# Patient Record
Sex: Male | Born: 1935
Health system: Southern US, Community
[De-identification: ages and names within clinical notes are randomized; demographics above are authoritative.]

## PROBLEM LIST (undated history)

## (undated) DIAGNOSIS — D126 Benign neoplasm of colon, unspecified: Secondary | ICD-10-CM

## (undated) DIAGNOSIS — I63422 Cerebral infarction due to embolism of left anterior cerebral artery: Secondary | ICD-10-CM

## (undated) DIAGNOSIS — K4090 Unilateral inguinal hernia, without obstruction or gangrene, not specified as recurrent: Secondary | ICD-10-CM

## (undated) DIAGNOSIS — M1990 Unspecified osteoarthritis, unspecified site: Secondary | ICD-10-CM

## (undated) DIAGNOSIS — M6740 Ganglion, unspecified site: Secondary | ICD-10-CM

## (undated) DIAGNOSIS — R8290 Unspecified abnormal findings in urine: Secondary | ICD-10-CM

## (undated) DIAGNOSIS — N501 Vascular disorders of male genital organs: Secondary | ICD-10-CM

## (undated) DIAGNOSIS — E785 Hyperlipidemia, unspecified: Secondary | ICD-10-CM

## (undated) DIAGNOSIS — C4490 Unspecified malignant neoplasm of skin, unspecified: Secondary | ICD-10-CM

## (undated) DIAGNOSIS — M2040 Other hammer toe(s) (acquired), unspecified foot: Secondary | ICD-10-CM

## (undated) DIAGNOSIS — M519 Unspecified thoracic, thoracolumbar and lumbosacral intervertebral disc disorder: Secondary | ICD-10-CM

## (undated) DIAGNOSIS — R3129 Other microscopic hematuria: Secondary | ICD-10-CM

## (undated) DIAGNOSIS — F329 Major depressive disorder, single episode, unspecified: Secondary | ICD-10-CM

## (undated) DIAGNOSIS — N393 Stress incontinence (female) (male): Secondary | ICD-10-CM

## (undated) DIAGNOSIS — Z87448 Personal history of other diseases of urinary system: Secondary | ICD-10-CM

## (undated) DIAGNOSIS — C61 Malignant neoplasm of prostate: Secondary | ICD-10-CM

## (undated) DIAGNOSIS — I10 Essential (primary) hypertension: Secondary | ICD-10-CM

## (undated) DIAGNOSIS — N183 Chronic kidney disease, stage 3 unspecified: Secondary | ICD-10-CM

## (undated) DIAGNOSIS — N200 Calculus of kidney: Secondary | ICD-10-CM

## (undated) DIAGNOSIS — C439 Malignant melanoma of skin, unspecified: Secondary | ICD-10-CM

## (undated) DIAGNOSIS — I639 Cerebral infarction, unspecified: Secondary | ICD-10-CM

## (undated) DIAGNOSIS — F0390 Unspecified dementia without behavioral disturbance: Secondary | ICD-10-CM

## (undated) DIAGNOSIS — K219 Gastro-esophageal reflux disease without esophagitis: Secondary | ICD-10-CM

## (undated) DIAGNOSIS — F32A Depression, unspecified: Secondary | ICD-10-CM

## (undated) HISTORY — DX: Cerebral infarction due to embolism of left anterior cerebral artery: I63.422

## (undated) HISTORY — PX: WRIST GANGLION EXCISION: SUR520

## (undated) HISTORY — DX: Calculus of kidney: N20.0

## (undated) HISTORY — DX: Unspecified thoracic, thoracolumbar and lumbosacral intervertebral disc disorder: M51.9

## (undated) HISTORY — DX: Ganglion, unspecified site: M67.40

## (undated) HISTORY — DX: Personal history of other diseases of urinary system: Z87.448

## (undated) HISTORY — DX: Stress incontinence (female) (male): N39.3

## (undated) HISTORY — PX: KIDNEY STONE SURGERY: SHX686

## (undated) HISTORY — DX: Benign neoplasm of colon, unspecified: D12.6

## (undated) HISTORY — DX: Vascular disorders of male genital organs: N50.1

## (undated) HISTORY — PX: COLONOSCOPY: SHX174

## (undated) HISTORY — DX: Chronic kidney disease, stage 3 (moderate): N18.3

## (undated) HISTORY — DX: Unilateral inguinal hernia, without obstruction or gangrene, not specified as recurrent: K40.90

## (undated) HISTORY — DX: Other microscopic hematuria: R31.29

## (undated) HISTORY — DX: Chronic kidney disease, stage 3 unspecified: N18.30

## (undated) HISTORY — DX: Unspecified abnormal findings in urine: R82.90

## (undated) HISTORY — DX: Other hammer toe(s) (acquired), unspecified foot: M20.40

## (undated) HISTORY — DX: Hyperlipidemia, unspecified: E78.5

## (undated) HISTORY — DX: Malignant neoplasm of prostate: C61

## (undated) HISTORY — DX: Malignant melanoma of skin, unspecified: C43.9

---

## 1997-07-19 HISTORY — PX: HERNIA REPAIR: SHX51

## 2005-09-03 ENCOUNTER — Ambulatory Visit: Payer: Self-pay | Admitting: Urology

## 2005-09-06 ENCOUNTER — Ambulatory Visit: Payer: Self-pay | Admitting: Urology

## 2005-09-13 ENCOUNTER — Ambulatory Visit: Payer: Self-pay | Admitting: Urology

## 2005-09-16 HISTORY — PX: RETROPUBIC PROSTATECTOMY: SUR1055

## 2005-09-28 ENCOUNTER — Other Ambulatory Visit: Payer: Self-pay

## 2005-10-07 ENCOUNTER — Inpatient Hospital Stay: Payer: Self-pay | Admitting: Urology

## 2007-12-18 DIAGNOSIS — C439 Malignant melanoma of skin, unspecified: Secondary | ICD-10-CM

## 2007-12-18 HISTORY — PX: MELANOMA EXCISION: SHX5266

## 2007-12-18 HISTORY — DX: Malignant melanoma of skin, unspecified: C43.9

## 2007-12-29 ENCOUNTER — Ambulatory Visit: Payer: Self-pay | Admitting: Dermatology

## 2008-05-19 HISTORY — PX: HERNIA REPAIR: SHX51

## 2008-06-03 ENCOUNTER — Ambulatory Visit: Payer: Self-pay | Admitting: Surgery

## 2008-06-10 ENCOUNTER — Ambulatory Visit: Payer: Self-pay | Admitting: Surgery

## 2008-09-02 DIAGNOSIS — D039 Melanoma in situ, unspecified: Secondary | ICD-10-CM

## 2008-09-02 HISTORY — DX: Melanoma in situ, unspecified: D03.9

## 2009-02-10 ENCOUNTER — Ambulatory Visit: Payer: Self-pay | Admitting: Dermatology

## 2009-04-02 ENCOUNTER — Ambulatory Visit: Payer: Self-pay | Admitting: Unknown Physician Specialty

## 2012-01-19 ENCOUNTER — Ambulatory Visit: Payer: Self-pay | Admitting: Urology

## 2012-01-19 LAB — CREATININE, SERUM: Creatinine: 1.07 mg/dL (ref 0.60–1.30)

## 2012-05-03 ENCOUNTER — Ambulatory Visit: Payer: Self-pay | Admitting: Urology

## 2012-05-19 HISTORY — PX: EXTRACORPOREAL SHOCK WAVE LITHOTRIPSY: SHX1557

## 2012-05-25 ENCOUNTER — Ambulatory Visit: Payer: Self-pay | Admitting: Urology

## 2012-05-29 ENCOUNTER — Ambulatory Visit: Payer: Self-pay | Admitting: Urology

## 2012-06-09 ENCOUNTER — Ambulatory Visit: Payer: Self-pay | Admitting: Urology

## 2012-08-09 ENCOUNTER — Ambulatory Visit: Payer: Self-pay | Admitting: Urology

## 2013-06-28 ENCOUNTER — Encounter: Payer: Self-pay | Admitting: Unknown Physician Specialty

## 2013-07-19 ENCOUNTER — Encounter: Payer: Self-pay | Admitting: Unknown Physician Specialty

## 2013-08-15 ENCOUNTER — Ambulatory Visit: Payer: Self-pay | Admitting: Urology

## 2013-12-21 DIAGNOSIS — C61 Malignant neoplasm of prostate: Secondary | ICD-10-CM

## 2013-12-21 DIAGNOSIS — N200 Calculus of kidney: Secondary | ICD-10-CM

## 2013-12-21 DIAGNOSIS — E785 Hyperlipidemia, unspecified: Secondary | ICD-10-CM

## 2013-12-21 DIAGNOSIS — M519 Unspecified thoracic, thoracolumbar and lumbosacral intervertebral disc disorder: Secondary | ICD-10-CM

## 2013-12-21 HISTORY — DX: Hyperlipidemia, unspecified: E78.5

## 2013-12-21 HISTORY — DX: Unspecified thoracic, thoracolumbar and lumbosacral intervertebral disc disorder: M51.9

## 2013-12-21 HISTORY — DX: Malignant neoplasm of prostate: C61

## 2013-12-21 HISTORY — DX: Calculus of kidney: N20.0

## 2013-12-31 DIAGNOSIS — I10 Essential (primary) hypertension: Secondary | ICD-10-CM

## 2013-12-31 HISTORY — DX: Essential (primary) hypertension: I10

## 2014-04-04 DIAGNOSIS — C4492 Squamous cell carcinoma of skin, unspecified: Secondary | ICD-10-CM

## 2014-04-04 HISTORY — DX: Squamous cell carcinoma of skin, unspecified: C44.92

## 2014-06-12 LAB — CBC
HCT: 43.1 % (ref 40.0–52.0)
HGB: 14.6 g/dL (ref 13.0–18.0)
MCH: 31 pg (ref 26.0–34.0)
MCHC: 33.8 g/dL (ref 32.0–36.0)
MCV: 92 fL (ref 80–100)
Platelet: 257 10*3/uL (ref 150–440)
RBC: 4.7 10*6/uL (ref 4.40–5.90)
RDW: 14.1 % (ref 11.5–14.5)
WBC: 11.8 10*3/uL — AB (ref 3.8–10.6)

## 2014-06-12 LAB — COMPREHENSIVE METABOLIC PANEL
ALT: 25 U/L
Albumin: 3.5 g/dL (ref 3.4–5.0)
Alkaline Phosphatase: 95 U/L
Anion Gap: 9 (ref 7–16)
BUN: 23 mg/dL — ABNORMAL HIGH (ref 7–18)
Bilirubin,Total: 0.3 mg/dL (ref 0.2–1.0)
CALCIUM: 9.5 mg/dL (ref 8.5–10.1)
CO2: 27 mmol/L (ref 21–32)
Chloride: 106 mmol/L (ref 98–107)
Creatinine: 1.45 mg/dL — ABNORMAL HIGH (ref 0.60–1.30)
EGFR (African American): >60
EGFR (Non-African Amer.): 50 — ABNORMAL LOW
Glucose: 128 mg/dL — ABNORMAL HIGH (ref 65–99)
OSMOLALITY: 288 (ref 275–301)
POTASSIUM: 3.4 mmol/L — AB (ref 3.5–5.1)
SGOT(AST): 32 U/L (ref 15–37)
Sodium: 142 mmol/L (ref 136–145)
Total Protein: 7.1 g/dL (ref 6.4–8.2)

## 2014-06-12 LAB — URINALYSIS, COMPLETE
BACTERIA: NONE SEEN
BLOOD: NEGATIVE
Bilirubin,UR: NEGATIVE
Glucose,UR: NEGATIVE mg/dL (ref 0–75)
Ketone: NEGATIVE
Leukocyte Esterase: NEGATIVE
Nitrite: NEGATIVE
PROTEIN: NEGATIVE
Ph: 6 (ref 4.5–8.0)
Specific Gravity: 1.005 (ref 1.003–1.030)
Squamous Epithelial: NONE SEEN
WBC UR: 2 /HPF (ref 0–5)

## 2014-06-12 LAB — TROPONIN I: Troponin-I: <0.02 ng/mL

## 2014-06-12 LAB — PROTIME-INR
INR: 1.1
Prothrombin Time: 14 secs (ref 11.5–14.7)

## 2014-06-12 LAB — APTT: Activated PTT: 29.2 secs (ref 23.6–35.9)

## 2014-06-13 ENCOUNTER — Observation Stay: Payer: Self-pay | Admitting: Internal Medicine

## 2014-06-13 LAB — TROPONIN I: Troponin-I: <0.02 ng/mL

## 2014-06-13 LAB — FOLATE: FOLIC ACID: 29.7 ng/mL (ref 3.1–100.0)

## 2014-06-14 LAB — CBC WITH DIFFERENTIAL/PLATELET
Bands: 2 %
Basophil: 1 %
Eosinophil: 7 %
HCT: 42.5 % (ref 40.0–52.0)
HGB: 14.3 g/dL (ref 13.0–18.0)
Lymphocytes: 23 %
MCH: 31.1 pg (ref 26.0–34.0)
MCHC: 33.7 g/dL (ref 32.0–36.0)
MCV: 92 fL (ref 80–100)
METAMYELOCYTE: 1 %
MYELOCYTE: 2 %
Monocytes: 5 %
PLATELETS: 253 10*3/uL (ref 150–440)
RBC: 4.61 10*6/uL (ref 4.40–5.90)
RDW: 14.1 % (ref 11.5–14.5)
SEGMENTED NEUTROPHILS: 54 %
VARIANT LYMPHOCYTE - H1-RLYMPH: 5 %
WBC: 11.5 10*3/uL — AB (ref 3.8–10.6)

## 2014-06-14 LAB — LIPID PANEL
Cholesterol: 220 mg/dL — ABNORMAL HIGH (ref 0–200)
HDL Cholesterol: 28 mg/dL — ABNORMAL LOW (ref 40–60)
Ldl Cholesterol, Calc: 149 mg/dL — ABNORMAL HIGH (ref 0–100)
Triglycerides: 214 mg/dL — ABNORMAL HIGH (ref 0–200)
VLDL Cholesterol, Calc: 43 mg/dL — ABNORMAL HIGH (ref 5–40)

## 2014-06-14 LAB — BASIC METABOLIC PANEL
Anion Gap: 4 — ABNORMAL LOW (ref 7–16)
BUN: 20 mg/dL — AB (ref 7–18)
CALCIUM: 9.8 mg/dL (ref 8.5–10.1)
CHLORIDE: 104 mmol/L (ref 98–107)
Co2: 31 mmol/L (ref 21–32)
Creatinine: 1.43 mg/dL — ABNORMAL HIGH (ref 0.60–1.30)
EGFR (Non-African Amer.): 50 — ABNORMAL LOW
Glucose: 113 mg/dL — ABNORMAL HIGH (ref 65–99)
Osmolality: 281 (ref 275–301)
POTASSIUM: 3.6 mmol/L (ref 3.5–5.1)
SODIUM: 139 mmol/L (ref 136–145)

## 2014-06-14 LAB — TSH: Thyroid Stimulating Horm: 4.94 u[IU]/mL — ABNORMAL HIGH

## 2014-09-25 ENCOUNTER — Ambulatory Visit: Payer: Self-pay | Admitting: Ophthalmology

## 2014-09-25 HISTORY — PX: EYE SURGERY: SHX253

## 2014-11-09 NOTE — Discharge Summary (Signed)
PATIENT NAME:  Fred Jones, KASLER MR#:  [id number] DATE OF BIRTH:  [birth year]  DATE OF ADMISSION:  06/13/2014 DATE OF DISCHARGE: 06/14/2014  DIAGNOSES AT TIME OF DISCHARGE: 1. Multiple falls with gait instability and dehydration.  2. Right wrist pisiform bone avulsion fracture.  3. Hypertension.  4. History of prostate cancer.   CHIEF COMPLAINT: Recurrent falls and balance and gait instability.   HISTORY OF PRESENT ILLNESS: Fred Jones is an 81 year old male with a past medical history significant for hypertension, prostate cancer with radical prostatectomy in March of 2007, presented to the ED complaining of multiple falls secondary to gait instability which started recently. The patient also fell recently and hurt his right wrist and right shoulder. Denies any chest pain, shortness of breath. No dizziness or loss of consciousness. No numbness or tingling in his feet. Please see H and P for other details. The patient was admitted to [facility].   Lab work showed glucose 128, BUN 23, creatinine 1.45. Serum sodium 142, potassium 3.4, chloride 106, bicarbonate 27, AST of 32, ALT of 25. Troponin less than 0.02. WBC 11.8, hemoglobin 14.6. CT of the head did not show any acute intracranial findings. There was generalized brain atrophy. EKG showed a ventricular rate of 67 beats per minute with left axis deviation and nonspecific ST-T changes.   HOSPITAL COURSE: The patient was admitted to [facility] and received intravenous fluids. His serum creatinine did improve marginally to 1.43. His hydrochlorothiazide was changed to 12.5 mg a day. He was started on losartan 25 mg a day for high blood pressure. Troponin was negative. He was ambulated. He was also seen by the orthopedist, Dr. Thornton Park, who recommended taking anti-inflammatory agents and also was given a thumb spica splint and follow up in his office in 2 weeks if he had continued pain. The patient was discharged in stable condition on the following  medications.  DISCHARGE MEDICATIONS: Aspirin 81 mg a day, losartan 25 mg a day, hydrochlorothiazide 12.5 mg a day, magnesium gluconate 500 mg b.i.d., multivitamin 1 tablet a day.   He has been advised to use the thumb spica splint and to follow up with Dr. Mack Guise if needed; otherwise follow up with Dr. Sabra Heck, his primary care physician, in 1 to 2 weeks' time. Call back with any questions or concerns.   TOTAL TIME SPENT ON DISCHARGING PATIENT: 35 minutes.     ____________________________ Tracie Harrier, MD vh:TT D: 06/14/2014 12:51:01 ET T: 06/14/2014 14:10:34 ET JOB#: [id number]  cc: Tracie Harrier, MD, <Dictator> Tracie Harrier MD ELECTRONICALLY SIGNED 06/18/2014 17:50

## 2014-11-09 NOTE — Consult Note (Signed)
Brief Consult Note: Diagnosis: Right thumb cmc joint sprain vs. osteoarthritis and right shoulder impingement.   Consult note dictated.   Recommend further assessment or treatment.   Comments: Patient has pain in right shoulder and thumb after a fall.  He tried to catch himself with the right upper extremity.  He has full ROM and strength of the right shoulder with pain during impingement testing.  He also has tenderness over the base of the right thumb which is mild to moderate.  He has no tenderness over the right pisiform.  I have reviewed the xrays.  Patient was given a thumb spica splint.  He may take an anti-inflammatory and follow up in my office in 2 weeks if he continues to have pain.  Electronic Signatures: Thornton Park (MD)  (Signed [id number] 12:44)  Authored: Brief Consult Note   Last Updated: 27-Nov-15 12:44 by Thornton Park (MD)

## 2014-11-09 NOTE — Discharge Summary (Signed)
PATIENT NAME:  Fred Jones, Fred Jones MR#:  [id number] DATE OF BIRTH:  Jan 30, 1931  DATE OF ADMISSION:  06/13/2014 DATE OF DISCHARGE:  06/14/2014  DIAGNOSES AT TIME OF DISCHARGE:  1.  Recent history of multiple falls due to imbalance and gait instability.  2.  Dehydration.  3.  Fall with possible avulsion fracture to the right pisiform bone.  4.  Hypertension.   CHIEF COMPLAINT: Frequent falls, poor balance, right wrist pain, and shoulder pain.   HISTORY OF PRESENT ILLNESS: Eann Cleland is an 81 year old male with a past medical history significant for hypertension, history of prostate cancer, status post radical prostatectomy, presented to the ED complaining of multiple falls secondary to poor balance and gait instability. The patient also has been complaining of pain in the right wrist after a recent fall.   PAST MEDICAL HISTORY: Significant for hypertension, prostate CA, radical prostatectomy, bilateral inguinal hernia repair. Please see H and P for full details.   HOSPITAL COURSE: The patient was admitted to [facility] and placed on telemetry. He underwent MRI of the brain without contrast, which showed no acute or focal abnormality. There was moderate generalized atrophy and mild white matter disease reflecting chronic microvascular ischemia. Ultrasound of the carotids showed mild carotid atherosclerosis. No hemodynamically significant ICA stenosis. There was a degree of narrowing of less than 50% bilaterally. The patient also underwent an x-ray of the right shoulder which did not show any acute osseous abnormality. There was moderate degenerative changes involving the [facility] joint. A right wrist x-ray showed avulsion fracture involving the pisiform versus calcified loose bodies within the joint. Avulsion fracture was favored. There was severe osteoarthritis involving the scaphotrapezium and scaphotrapezial joints. The patient was seen in consultation by orthopedist, Dr. Mack Guise, who recommended a thumb Spica  splint and also advised anti-inflammatory agents. His lab work did show evidence for dehydration with a BUN of 23 and creatinine 1.45 on admission. He was, therefore, advised to decrease the dose of hydrochlorothiazide from 25 mg to 12.5 mg a day and was started on losartan for blood pressure. The patient was discharged in stable condition on the following medications.   DISCHARGE MEDICATIONS: Losartan 25 mg once a day, aspirin 81 mg a day, hydrochlorothiazide 12.5 mg once a day, multivitamin 1 tablet a day, magnesium gluconate 500 mg p.o. b.i.d.   He has been advised to follow up with his primary care physician, Dr. Emily Filbert, in 2 to 3 weeks' time. Call back with any questions or concerns. The patient was stable at the time of discharge.   TOTAL TIME SPENT ON DISCHARGING THIS PATIENT: 35 minutes.    ____________________________ Tracie Harrier, MD vh:ts D: 06/16/2014 13:13:10 ET T: 06/16/2014 21:27:22 ET JOB#: [id number]  cc: Tracie Harrier, MD, <Dictator> Tracie Harrier MD ELECTRONICALLY SIGNED 06/18/2014 17:50

## 2014-11-09 NOTE — Consult Note (Signed)
PATIENT NAME:  Fred Jones, Fred Jones MR#:  [id number] DATE OF BIRTH:  16-Jun-1931  DATE OF CONSULTATION:  06/14/2014  REFERRING PHYSICIAN:   CONSULTING PHYSICIAN:  Timoteo Gaul, MD  REASON FOR CONSULTATION: Right wrist injury status post fall.   HISTORY OF PRESENT ILLNESS: Fred Jones is an 81 year old male who has had multiple falls over the past 24 hours due to what he states is gait imbalance. The patient had no weakness or numbness. He was admitted to the medical service. I was consulted for evaluation of right wrist pain after his fall. X-rays of the right wrist and shoulder were taken, the radiologist read the right wrist films as possible avulsion fracture to the pisiform.   At the bedside today the patient denies any pain over the pisiform or ulnar aspect of the wrist. He has mild to moderate pain at the first [facility] joint. He also complains of right shoulder pain.   PHYSICAL EXAMINATION:  GENERAL APPEARANCE: The patient is alert, sitting in bed. He is in no acute distress. A male companion is with him at the bedside.  EXTREMITIES:  Examination of the right shoulder shows the skin is intact. There is no erythema, ecchymosis, or swelling. He can forward elevate to 160 degrees. He can actively abduct his shoulder as well with mild discomfort. He had positive impingement signs but no apprehension or instability. He had 5 out of 5 strength in all muscle groups at the rotator cuff to manual testing today. He had mild discomfort with downward directed force on his abducted shoulder, but demonstrated no weakness. He has sensation to light touch and a palpable radial pulse. He had point tenderness over the first [facility] joint with a negative grind test. He can make a fist and fully extend all digits. He demonstrated no weakness of grip or with thumb extension or abduction.  He had intact sensation to light touch and he had intact motor function of all digits. There was no focal swelling or deformity or  ecchymosis.   RADIOLOGY: Right shoulder and wrist films were reviewed today. The patient has no evidence of fracture or dislocation of the right shoulder. He has mild degenerative changes at the [facility] and glenohumeral joint.   The right wrist films show significant degenerative changes at the base of the right thumb at the first [facility] joint. There is a calcification near the pisiform, but no evidence of definite fracture.  ASSESSMENT:  1.  Right shoulder contusion versus traumatic impingement.  2.  Right thumb carpometacarpal joint arthrosis with possible right thumb sprain.   PLAN:  I recommend that Fred Jones  wear a thumb spica removable wrist splint. This was provided to him per my office today. It is a Velcro splint that I would like him to wear except for sleep and showering. He may take an antiinflammatory to help reduce inflammation of the right shoulder and wrist. He may take this over-the-counter, whichever medication he has at home. He will follow up with me in the office in 2 weeks if he continues to have pain either in the right shoulder or right wrist. The patient understood and agreed with this plan.    ____________________________ Timoteo Gaul, MD klk:bu D: 06/14/2014 15:03:19 ET T: 06/14/2014 15:37:29 ET JOB#: [id number]  cc: Timoteo Gaul, MD, <Dictator> Timoteo Gaul MD ELECTRONICALLY SIGNED 06/17/2014 17:48

## 2014-11-09 NOTE — H&P (Signed)
PATIENT NAME:  Fred Jones, Fred Jones MR#:  [id number] DATE OF BIRTH:  10-Apr-1931  DATE OF ADMISSION:  06/13/2014  REFERRING PHYSICIAN:  Gretchen Short. Beather Arbour, MD  PRIMARY CARE PHYSICIAN:  Rusty Aus, MD, of [facility]   ADMITTING PHYSICIAN:  Juluis Mire, MD   CHIEF COMPLAINT:  Recent onset of frequent falls secondary due to imbalance and gait instability.    HISTORY OF PRESENT ILLNESS:  This is an 81 year old Caucasian male with a past medical history significant for hypertension and history of prostate cancer status post radical prostatectomy in March 2007, who presents to the Emergency Room with the complaints of onset of multiple falls secondary due to gait imbalance and gait instability, which started recently. The patient states that he was doing well until recently. For the past 2 to 3 days, he has been noticing gait imbalance, and today he noticed frequent falls, about 4 in the last 24 hours; hence, he was brought to the Emergency Room by the family members for further evaluation through EMS. The patient denies any focal weakness or numbness, but he states that both legs kind of feel weak and giving away, but denies any focal weakness or numbness. No associated chest pain, shortness of breath, dizziness or loss of consciousness. No recent fever or cough. No nausea, vomiting, diarrhea, or constipation. No urinary symptoms. The patient has a history of falls but did not fall since he was caught by the family members, preventing fall to the floor. Denies any injury to any body parts. Denies any such symptoms in the past.    PAST MEDICAL HISTORY: 1.  Hypertension.  2.  Prostatic cancer status post radical prostatectomy in March 2007.   PAST SURGICAL HISTORY:  1.  Status post radical prostatectomy in March 2007.  2.  Bilateral inguinal hernia repair in 2009.   ALLERGIES:  No known drug allergies.   HOME MEDICATIONS: 1.  Aspirin 81 mg tablet 1 tablet once a day.  2.  Hydrochlorothiazide 25 mg  tablet 1 tablet orally once a day.  3.  Magnesium gluconate 500 mg tablet 1 tablet orally 2 times a day.  4.  Multivitamin 1 tablet once a day orally.   FAMILY HISTORY:  Mother and father significant for CVA.   SOCIAL HISTORY:  He is a retired Scientist, physiological. He lives with his wife at home. No history of any smoking. He does take alcohol occasionally socially. No history of any drug usage.    REVIEW OF SYSTEMS: CONSTITUTIONAL:  Negative for fever, fatigue, or generalized weakness, but he does have some bilateral leg weakness, following which he has gait instability and frequent falls of recent onset. No abnormal weight gain or weight loss lately.  EYES:  Negative for blurred vision or double vision. No pain. No redness. No inflammation.  EARS, NOSE, AND THROAT:  Negative for tinnitus, ear pain, hearing loss, epistaxis, nasal discharge, or difficulty swallowing.  RESPIRATORY:  Negative for cough, wheezing, hemoptysis, dyspnea, or painful respiration.  CARDIOVASCULAR:  Negative for chest pain. No dyspnea on exertion or orthopnea, pedal edema, palpitations, syncope, or loss of consciousness.  GASTROINTESTINAL:  Negative for nausea, vomiting, diarrhea, abdominal pain, GERD symptoms, hematemesis, melena, or rectal bleeding.  GENITOURINARY:  Negative for dysuria, hematuria, frequency, or urgency.  ENDOCRINE:  Negative for polyuria or polydipsia. No heat or cold intolerance. Negative for anemia, easy bruising or bleeding, or swollen glands.  INTEGUMENTARY:  Negative for acne or skin lesions.  MUSCULOSKELETAL:  Negative for any arthralgia, arthritis,  swelling, or pain.  NEUROLOGICAL:  He does have the weakness of the lower legs with poor balance with gait instability and frequent falls ongoing and had about 4 today. No history of CVA, TIA, or seizure disorder in the past.  PSYCHIATRIC:  Negative for anxiety, insomnia, or depression.   PHYSICAL EXAMINATION: VITAL SIGNS:  Temperature 97.7 degrees  Fahrenheit, pulse rate 63 per minute and regular, respirations 16 per minute, blood pressure on admission 150/72, pulse oximetry 93% on room air.  GENERAL:  Well-nourished, pleasant and cooperative, alert, awake, oriented, in no acute distress.   HEAD:  Atraumatic, normocephalic.  EYES:  Pupils are equal and reactive to light and accommodation. No conjunctival pallor. No scleral icterus. Extraocular movements are intact.  NOSE:  No nasal lesions. No drainage.  EARS:  No drainage. No external lesions.  Oral cavity:  No mucosal lesions. No exudates. No masses.  NECK:  Supple. No JVD. No thyromegaly. No carotid bruit. Range of motion is normal.  RESPIRATORY:  Good respiratory effort. Not using accessory muscles of respiration. Clear to auscultation and percussion bilaterally.  CARDIOVASCULAR:  S1 and S2, regular. No murmurs appreciated. No gallops. No clicks. Peripheral pulses are equal at carotid, femoral, and pedal pulses. No pedal edema.  GASTROINTESTINAL:  Abdomen is soft and nontender. No organomegaly. Bowel sounds are present and equal in all 4 quadrants. No rebound. No guarding. No rigidity.  GENITOURINARY:  Deferred.  MUSCULOSKELETAL:  Gait is not tested. Range of motion is normal in all areas. Strength and tone are equal bilaterally.  SKIN:  Inspection within normal limits.  LYMPHATIC:  No cervical lymphadenopathy.  VASCULAR:  Good dorsalis pedis and posterior tibial pulses.  NEUROLOGICAL:  Alert, awake, and oriented x 3. Cranial nerves II through XII are grossly intact. Deep tendon reflexes are 2+ and symmetrical bilaterally in upper and lower extremities. Motor strength is 5/5 in both upper and lower extremities.  PSYCHIATRIC:  Judgment and insight are adequate. Alert and oriented x 3. Memory and mood are within normal limits.   LABORATORY AND RADIOGRAPHIC DATA:  Serum glucose is 128, BUN 23, creatinine 1.45, serum sodium 142, potassium 3.4, chloride 106, bicarbonate 27, serum calcium 9.5,  total protein 7.1, albumin 3.5, total bilirubin 0.3, alkaline phosphatase 95, AST 32, and ALT is 25. Troponin is less than 0.02. CBC:  WBC 11.8, hemoglobin 14.6, platelet count is 257. Prothrombin time is 14.0. Urinalysis:  Unremarkable and negative.   CT of the head, noncontrast study:  No acute intracranial findings. Generalized brain atrophy.   EKG:  Poor baseline secondary due to motion artifact, ventricular rate of 67 beats per minute, left axis deviation, LVH present, nonspecific ST and T wave abnormality.   ASSESSMENT AND PLAN:  This is an 81 year old Caucasian male with a past medical history significant for hypertension and history of prostate cancer status post radical prostatectomy, who presents to the Emergency Room with the complaints of multiple falls of recent onset secondary due to imbalance and gait instability.    1.  History of multiple falls secondary due to imbalance and gait instability, recent onset, rule out posterior circulation cerebrovascular accident. Plan:  Admit to telemetry. Neurological watch. Cycle cardiac enzymes to rule out any acute coronary event. Aspirin. Monitor blood pressure. Fall precautions. Order MRI of the brain and bilateral carotid Dopplers to rule out any carotid insufficiency. Check TSH, serum B12, and folate to rule out any metabolic neuropathy causes.  2.  Hypertension, well controlled on home medication hydrochlorothiazide. Continue same.  3.  Mild hypokalemia, unclear etiology. Plan:  Supplement potassium. Follow up potassium.  4.  History of prostate cancer status post radical prostatectomy in 2007. No symptoms. Stable clinically. Monitor.  5.  Mildly elevated BUN and creatinine, likely secondary due to dehydration. Encourage oral hydration. Follow up BMP.  6.  Mild leukocytosis, likely secondary due to stress. No fever: Temperature is normal. Monitor clinically. Follow up temperature curve. Repeat CBC and follow up accordingly.   7.  Deep vein  thrombosis prophylaxis with subcutaneous Lovenox.  8.  Gastrointestinal prophylaxis with ranitidine.   CODE STATUS:  Full code.   TIME SPENT:  55 minutes.    ____________________________ Juluis Mire, MD enr:nb D: 06/13/2014 02:19:48 ET T: 06/13/2014 03:29:39 ET JOB#: [id number]  cc: Juluis Mire, MD, <Dictator> Rusty Aus, MD Juluis Mire MD ELECTRONICALLY SIGNED 06/14/2014 19:24

## 2014-11-20 ENCOUNTER — Encounter: Payer: Self-pay | Admitting: *Deleted

## 2014-11-25 NOTE — Anesthesia Preprocedure Evaluation (Addendum)
Anesthesia Evaluation  Patient identified by MRN, date of birth, ID band Patient awake    Reviewed: Allergy & Precautions, NPO status , Patient's Chart, lab work & pertinent test results  Airway Mallampati: II  TM Distance: >3 FB Neck ROM: Full    Dental no notable dental hx.    Pulmonary neg pulmonary ROS,    Pulmonary exam normal       Cardiovascular hypertension, Pt. on medications  Denies CP/SOB   Neuro/Psych negative neurological ROS     GI/Hepatic Neg liver ROS, GERD-  Controlled,  Endo/Other  negative endocrine ROS  Renal/GU negative Renal ROS  negative genitourinary   Musculoskeletal  (+) Arthritis -,   Abdominal   Peds  Hematology negative hematology ROS (+)   Anesthesia Other Findings   Reproductive/Obstetrics                           Anesthesia Physical Anesthesia Plan  ASA: II  Anesthesia Plan: MAC   Post-op Pain Management:    Induction: Intravenous  Airway Management Planned:   Additional Equipment:   Intra-op Plan:   Post-operative Plan:   Informed Consent: I have reviewed the patients History and Physical, chart, labs and discussed the procedure including the risks, benefits and alternatives for the proposed anesthesia with the patient or authorized representative who has indicated his/her understanding and acceptance.     Plan Discussed with: CRNA  Anesthesia Plan Comments:         Anesthesia Quick Evaluation

## 2014-11-25 NOTE — Discharge Instructions (Signed)

## 2014-11-27 ENCOUNTER — Encounter: Payer: Self-pay | Admitting: Anesthesiology

## 2014-11-27 ENCOUNTER — Ambulatory Visit: Payer: Medicare Other | Admitting: Anesthesiology

## 2014-11-27 ENCOUNTER — Encounter
Admission: RE | Disposition: A | Discharge status: Home / Self Care | Payer: Self-pay | Source: Ambulatory Visit | Attending: Ophthalmology

## 2014-11-27 ENCOUNTER — Ambulatory Visit
Admission: RE | Admit: 2014-11-27 | Discharge: 2014-11-27 | Disposition: A | Discharge status: Home / Self Care | Payer: Medicare Other | Source: Ambulatory Visit | Attending: Ophthalmology | Admitting: Ophthalmology

## 2014-11-27 DIAGNOSIS — K219 Gastro-esophageal reflux disease without esophagitis: Secondary | ICD-10-CM | POA: Insufficient documentation

## 2014-11-27 DIAGNOSIS — Z9842 Cataract extraction status, left eye: Secondary | ICD-10-CM | POA: Insufficient documentation

## 2014-11-27 DIAGNOSIS — I10 Essential (primary) hypertension: Secondary | ICD-10-CM | POA: Insufficient documentation

## 2014-11-27 DIAGNOSIS — Z85828 Personal history of other malignant neoplasm of skin: Secondary | ICD-10-CM | POA: Diagnosis not present

## 2014-11-27 DIAGNOSIS — H2511 Age-related nuclear cataract, right eye: Secondary | ICD-10-CM | POA: Insufficient documentation

## 2014-11-27 DIAGNOSIS — M1990 Unspecified osteoarthritis, unspecified site: Secondary | ICD-10-CM | POA: Diagnosis not present

## 2014-11-27 DIAGNOSIS — Z7982 Long term (current) use of aspirin: Secondary | ICD-10-CM | POA: Diagnosis not present

## 2014-11-27 DIAGNOSIS — Z8546 Personal history of malignant neoplasm of prostate: Secondary | ICD-10-CM | POA: Diagnosis not present

## 2014-11-27 DIAGNOSIS — H269 Unspecified cataract: Secondary | ICD-10-CM | POA: Diagnosis present

## 2014-11-27 DIAGNOSIS — F329 Major depressive disorder, single episode, unspecified: Secondary | ICD-10-CM | POA: Diagnosis not present

## 2014-11-27 HISTORY — DX: Gastro-esophageal reflux disease without esophagitis: K21.9

## 2014-11-27 HISTORY — DX: Major depressive disorder, single episode, unspecified: F32.9

## 2014-11-27 HISTORY — DX: Unspecified malignant neoplasm of skin, unspecified: C44.90

## 2014-11-27 HISTORY — DX: Unspecified osteoarthritis, unspecified site: M19.90

## 2014-11-27 HISTORY — PX: CATARACT EXTRACTION W/PHACO: SHX586

## 2014-11-27 HISTORY — DX: Malignant neoplasm of prostate: C61

## 2014-11-27 HISTORY — DX: Essential (primary) hypertension: I10

## 2014-11-27 HISTORY — DX: Depression, unspecified: F32.A

## 2014-11-27 SURGERY — PHACOEMULSIFICATION, CATARACT, WITH IOL INSERTION
Anesthesia: Monitor Anesthesia Care | Laterality: Right

## 2014-11-27 MED ORDER — EPINEPHRINE HCL 1 MG/ML IJ SOLN
INTRAMUSCULAR | Status: DC | PRN
  Administered 2014-11-27: 1 mL

## 2014-11-27 MED ORDER — POVIDONE-IODINE 5 % OP SOLN
1.0000 "application " | Freq: Once | OPHTHALMIC | Status: AC
  Administered 2014-11-27: 1 via OPHTHALMIC

## 2014-11-27 MED ORDER — TETRACAINE HCL 0.5 % OP SOLN
1.0000 [drp] | Freq: Once | OPHTHALMIC | Status: AC
  Administered 2014-11-27: 1 [drp] via OPHTHALMIC

## 2014-11-27 MED ORDER — FENTANYL CITRATE (PF) 100 MCG/2ML IJ SOLN
INTRAMUSCULAR | Status: DC | PRN
  Administered 2014-11-27: 50 ug via INTRAVENOUS

## 2014-11-27 MED ORDER — MIDAZOLAM HCL 2 MG/2ML IJ SOLN
INTRAMUSCULAR | Status: DC | PRN
  Administered 2014-11-27: 2 mg via INTRAVENOUS

## 2014-11-27 MED ORDER — ACETAMINOPHEN 325 MG PO TABS: 325.0000 mg | ORAL_TABLET | ORAL | Status: DC | PRN

## 2014-11-27 MED ORDER — ARMC OPHTHALMIC DILATING GEL
1.0000 "application " | OPHTHALMIC | Status: DC | PRN
  Administered 2014-11-27 (×2): 1 via OPHTHALMIC

## 2014-11-27 MED ORDER — BSS PLUS IO SOLN: INTRAOCULAR | Status: DC | PRN

## 2014-11-27 MED ORDER — NA HYALUR & NA CHOND-NA HYALUR 0.4-0.35 ML IO KIT
PACK | INTRAOCULAR | Status: DC | PRN
Start: 2014-11-27 — End: 2014-11-27
  Administered 2014-11-27: 1 mL via INTRAOCULAR

## 2014-11-27 MED ORDER — BSS IO SOLN
INTRAOCULAR | Status: DC | PRN
  Administered 2014-11-27: 15 mL
  Administered 2014-11-27: 93 mL

## 2014-11-27 MED ORDER — CEFUROXIME OPHTHALMIC INJECTION 1 MG/0.1 ML
INJECTION | OPHTHALMIC | Status: DC | PRN
  Administered 2014-11-27: .3 mL via OPHTHALMIC

## 2014-11-27 MED ORDER — TIMOLOL MALEATE 0.5 % OP SOLN
OPHTHALMIC | Status: DC | PRN
  Administered 2014-11-27: 3 [drp] via OPHTHALMIC

## 2014-11-27 MED ORDER — BRIMONIDINE TARTRATE 0.2 % OP SOLN
OPHTHALMIC | Status: DC | PRN
  Administered 2014-11-27: 3 [drp] via OPHTHALMIC

## 2014-11-27 MED ORDER — ACETAMINOPHEN 160 MG/5ML PO SOLN: 325.0000 mg | ORAL | Status: DC | PRN

## 2014-11-27 SURGICAL SUPPLY — 25 items
CANNULA ANT/CHMB 27GA (MISCELLANEOUS) ×3 IMPLANT
GLOVE SURG LX 7.5 STRW (GLOVE) ×2
GLOVE SURG LX STRL 7.5 STRW (GLOVE) ×1 IMPLANT
GLOVE SURG TRIUMPH 8.0 PF LTX (GLOVE) ×3 IMPLANT
GOWN STRL REUS W/ TWL LRG LVL3 (GOWN DISPOSABLE) ×2 IMPLANT
GOWN STRL REUS W/TWL LRG LVL3 (GOWN DISPOSABLE) ×4
LENS IOL TECNIS 20.5 (Intraocular Lens) ×3 IMPLANT
LENS IOL TECNIS MONO 1P 20.5 (Intraocular Lens) ×1 IMPLANT
MARKER SKIN SURG W/RULER VIO (MISCELLANEOUS) ×3 IMPLANT
NDL RETROBULBAR .5 NSTRL (NEEDLE) IMPLANT
NEEDLE FILTER BLUNT 18X 1/2SAF (NEEDLE) ×2
NEEDLE FILTER BLUNT 18X1 1/2 (NEEDLE) ×1 IMPLANT
PACK CATARACT BRASINGTON (MISCELLANEOUS) ×3 IMPLANT
PACK EYE AFTER SURG (MISCELLANEOUS) ×3 IMPLANT
PACK OPTHALMIC (MISCELLANEOUS) ×3 IMPLANT
RING MALYGIN 7.0 (MISCELLANEOUS) IMPLANT
SUT ETHILON 10-0 CS-B-6CS-B-6 (SUTURE)
SUT VICRYL  9 0 (SUTURE)
SUT VICRYL 9 0 (SUTURE) IMPLANT
SUTURE EHLN 10-0 CS-B-6CS-B-6 (SUTURE) IMPLANT
SYR 3ML LL SCALE MARK (SYRINGE) ×3 IMPLANT
SYR 5ML LL (SYRINGE) IMPLANT
SYR TB 1ML LUER SLIP (SYRINGE) ×3 IMPLANT
WATER STERILE IRR 500ML POUR (IV SOLUTION) ×3 IMPLANT
WIPE NON LINTING 3.25X3.25 (MISCELLANEOUS) ×3 IMPLANT

## 2014-11-27 NOTE — Op Note (Signed)
LOCATION:  [place]   PREOPERATIVE DIAGNOSIS:     Nuclear sclerotic cataract right eye. H25.11   POSTOPERATIVE DIAGNOSIS:      Nuclear sclerotic cataract right eye.     PROCEDURE:  Phacoemusification with posterior chamber intraocular lens placement of the right eye   LENS:   Implant Name Type Inv. Item Serial No. Manufacturer Lot No. LRB No. Used  LENS IMPL INTRAOC [id number] 20.5 - [id number] Intraocular Lens LENS IMPL INTRAOC ZCB00 20.5 [id number] AMO   Right 1        ULTRASOUND TIME: 18 % of 1 minutes, 41 seconds.  CDE 18.2   SURGEON:  Wyonia Hough, MD   ANESTHESIA:  Topical with tetracaine drops and 2% Xylocaine jelly.   COMPLICATIONS:  None.   DESCRIPTION OF PROCEDURE:  The patient was identified in the holding room and transported to the operating room and placed in the supine position under the operating microscope.  The right eye was identified as the operative eye and it was prepped and draped in the usual sterile ophthalmic fashion.   A 1 millimeter clear-corneal paracentesis was made at the 12:00 position.  The anterior chamber was filled with Viscoat viscoelastic.  A 2.4 millimeter keratome was used to make a near-clear corneal incision at the 9:00 position.  A curvilinear capsulorrhexis was made with a cystotome and capsulorrhexis forceps.  Balanced salt solution was used to hydrodissect and hydrodelineate the nucleus.   Phacoemulsification was then used in stop and chop fashion to remove the lens nucleus and epinucleus.  The remaining cortex was then removed using the irrigation and aspiration handpiece. Provisc was then placed into the capsular bag to distend it for lens placement.  A 20.5 -diopter lens was then injected into the capsular bag.  The remaining viscoelastic was aspirated.   Wounds were hydrated with balanced salt solution.  The anterior chamber was inflated to a physiologic pressure with balanced salt solution.  No wound leaks were noted.  Cefuroxime 0.1 ml of a 10mg /ml solution was injected into the anterior chamber for a dose of 1 mg of intracameral antibiotic at the completion of the case.    Timolol and Brimonidine drops were applied to the eye.  The patient was taken to the recovery room in stable condition without complications of anesthesia or surgery.   Darril Patriarca 11/27/2014, 8:04 AM

## 2014-11-27 NOTE — Transfer of Care (Signed)
Immediate Anesthesia Transfer of Care Note  Patient: Fred TINO Sr.  Procedure(s) Performed: Procedure(s): CATARACT EXTRACTION PHACO AND INTRAOCULAR LENS PLACEMENT (IOC) (Right)  Patient Location: PACU  Anesthesia Type: MAC  Level of Consciousness: awake, alert  and patient cooperative  Airway and Oxygen Therapy: Patient Spontanous Breathing and Patient connected to supplemental oxygen  Post-op Assessment: Post-op Vital signs reviewed, Patient's Cardiovascular Status Stable, Respiratory Function Stable, Patent Airway and No signs of Nausea or vomiting  Post-op Vital Signs: Reviewed and stable  Complications: No apparent anesthesia complications

## 2014-11-27 NOTE — H&P (Signed)
The History and Physical notes were scanned in.  The patient remains stable and unchanged from the H&P.   Previous H&P reviewed, patient examined, and there are no changes.  Fred Jones 11/27/2014 9:19 AM

## 2014-11-27 NOTE — Anesthesia Postprocedure Evaluation (Signed)
  Anesthesia Post-op Note  Patient: Fred SKALICKY Sr.  Procedure(s) Performed: Procedure(s): CATARACT EXTRACTION PHACO AND INTRAOCULAR LENS PLACEMENT (IOC) (Right)  Anesthesia type:MAC  Patient location: PACU  Post pain: Pain level controlled  Post assessment: Post-op Vital signs reviewed, Patient's Cardiovascular Status Stable, Respiratory Function Stable, Patent Airway and No signs of Nausea or vomiting  Post vital signs: Reviewed and stable  Last Vitals:  Filed Vitals:   11/27/14 0803  BP: 125/70  Pulse: 51  Temp: 36.6 C  Resp:     Level of consciousness: awake, alert  and patient cooperative  Complications: No apparent anesthesia complications

## 2014-11-29 ENCOUNTER — Encounter: Payer: Self-pay | Admitting: Ophthalmology

## 2014-12-05 ENCOUNTER — Encounter: Payer: Self-pay | Admitting: Ophthalmology

## 2015-07-23 DIAGNOSIS — R413 Other amnesia: Secondary | ICD-10-CM | POA: Diagnosis not present

## 2015-07-23 DIAGNOSIS — R0609 Other forms of dyspnea: Secondary | ICD-10-CM | POA: Diagnosis not present

## 2015-07-23 DIAGNOSIS — R5383 Other fatigue: Secondary | ICD-10-CM | POA: Diagnosis not present

## 2015-07-23 DIAGNOSIS — Z79899 Other long term (current) drug therapy: Secondary | ICD-10-CM | POA: Diagnosis not present

## 2015-07-24 DIAGNOSIS — Z961 Presence of intraocular lens: Secondary | ICD-10-CM | POA: Diagnosis not present

## 2015-07-29 DIAGNOSIS — R4189 Other symptoms and signs involving cognitive functions and awareness: Secondary | ICD-10-CM | POA: Diagnosis not present

## 2015-07-29 DIAGNOSIS — G20 Parkinson's disease: Secondary | ICD-10-CM | POA: Diagnosis not present

## 2015-07-31 DIAGNOSIS — M7551 Bursitis of right shoulder: Secondary | ICD-10-CM | POA: Diagnosis not present

## 2015-08-05 DIAGNOSIS — I10 Essential (primary) hypertension: Secondary | ICD-10-CM | POA: Diagnosis not present

## 2015-08-05 DIAGNOSIS — R0600 Dyspnea, unspecified: Secondary | ICD-10-CM | POA: Diagnosis not present

## 2015-08-05 DIAGNOSIS — F330 Major depressive disorder, recurrent, mild: Secondary | ICD-10-CM | POA: Diagnosis not present

## 2015-08-05 DIAGNOSIS — E782 Mixed hyperlipidemia: Secondary | ICD-10-CM | POA: Diagnosis not present

## 2015-08-06 ENCOUNTER — Other Ambulatory Visit: Payer: Self-pay | Admitting: Specialist

## 2015-08-06 DIAGNOSIS — M7551 Bursitis of right shoulder: Secondary | ICD-10-CM

## 2015-08-12 ENCOUNTER — Ambulatory Visit: Payer: PPO | Attending: Neurology

## 2015-08-12 DIAGNOSIS — G4733 Obstructive sleep apnea (adult) (pediatric): Secondary | ICD-10-CM | POA: Insufficient documentation

## 2015-08-12 DIAGNOSIS — R51 Headache: Secondary | ICD-10-CM | POA: Insufficient documentation

## 2015-08-14 DIAGNOSIS — G4733 Obstructive sleep apnea (adult) (pediatric): Secondary | ICD-10-CM | POA: Diagnosis not present

## 2015-08-18 DIAGNOSIS — R413 Other amnesia: Secondary | ICD-10-CM | POA: Diagnosis not present

## 2015-08-18 DIAGNOSIS — I129 Hypertensive chronic kidney disease with stage 1 through stage 4 chronic kidney disease, or unspecified chronic kidney disease: Secondary | ICD-10-CM | POA: Diagnosis not present

## 2015-08-18 DIAGNOSIS — N35010 Post-traumatic urethral stricture, male, meatal: Secondary | ICD-10-CM | POA: Diagnosis not present

## 2015-08-18 DIAGNOSIS — Z9079 Acquired absence of other genital organ(s): Secondary | ICD-10-CM | POA: Diagnosis not present

## 2015-08-18 DIAGNOSIS — R400 Somnolence: Secondary | ICD-10-CM | POA: Diagnosis not present

## 2015-08-18 DIAGNOSIS — F329 Major depressive disorder, single episode, unspecified: Secondary | ICD-10-CM | POA: Diagnosis not present

## 2015-08-18 DIAGNOSIS — Z87442 Personal history of urinary calculi: Secondary | ICD-10-CM | POA: Diagnosis not present

## 2015-08-18 DIAGNOSIS — Z7982 Long term (current) use of aspirin: Secondary | ICD-10-CM | POA: Diagnosis not present

## 2015-08-18 DIAGNOSIS — Z8546 Personal history of malignant neoplasm of prostate: Secondary | ICD-10-CM | POA: Diagnosis not present

## 2015-08-18 DIAGNOSIS — G4730 Sleep apnea, unspecified: Secondary | ICD-10-CM | POA: Diagnosis not present

## 2015-08-18 DIAGNOSIS — N183 Chronic kidney disease, stage 3 (moderate): Secondary | ICD-10-CM | POA: Diagnosis not present

## 2015-08-18 DIAGNOSIS — N3941 Urge incontinence: Secondary | ICD-10-CM | POA: Diagnosis not present

## 2015-08-18 DIAGNOSIS — E785 Hyperlipidemia, unspecified: Secondary | ICD-10-CM | POA: Diagnosis not present

## 2015-08-18 DIAGNOSIS — N99114 Postprocedural urethral stricture, male, unspecified: Secondary | ICD-10-CM | POA: Diagnosis not present

## 2015-08-18 DIAGNOSIS — Z79899 Other long term (current) drug therapy: Secondary | ICD-10-CM | POA: Diagnosis not present

## 2015-08-18 DIAGNOSIS — R350 Frequency of micturition: Secondary | ICD-10-CM | POA: Diagnosis not present

## 2015-08-18 DIAGNOSIS — R3129 Other microscopic hematuria: Secondary | ICD-10-CM | POA: Diagnosis not present

## 2015-08-18 DIAGNOSIS — N200 Calculus of kidney: Secondary | ICD-10-CM | POA: Diagnosis not present

## 2015-08-26 ENCOUNTER — Ambulatory Visit
Admission: RE | Admit: 2015-08-26 | Discharge: 2015-08-26 | Disposition: A | Discharge status: Home / Self Care | Payer: PPO | Source: Ambulatory Visit | Attending: Specialist | Admitting: Specialist

## 2015-08-26 DIAGNOSIS — M7551 Bursitis of right shoulder: Secondary | ICD-10-CM | POA: Insufficient documentation

## 2015-08-26 DIAGNOSIS — M19011 Primary osteoarthritis, right shoulder: Secondary | ICD-10-CM | POA: Diagnosis not present

## 2015-08-26 DIAGNOSIS — M75121 Complete rotator cuff tear or rupture of right shoulder, not specified as traumatic: Secondary | ICD-10-CM | POA: Insufficient documentation

## 2015-08-26 DIAGNOSIS — M67911 Unspecified disorder of synovium and tendon, right shoulder: Secondary | ICD-10-CM | POA: Insufficient documentation

## 2015-08-26 DIAGNOSIS — M25411 Effusion, right shoulder: Secondary | ICD-10-CM | POA: Diagnosis not present

## 2015-08-26 DIAGNOSIS — M25511 Pain in right shoulder: Secondary | ICD-10-CM | POA: Diagnosis not present

## 2015-08-29 DIAGNOSIS — G4733 Obstructive sleep apnea (adult) (pediatric): Secondary | ICD-10-CM | POA: Diagnosis not present

## 2015-08-29 DIAGNOSIS — R4189 Other symptoms and signs involving cognitive functions and awareness: Secondary | ICD-10-CM | POA: Diagnosis not present

## 2015-09-12 DIAGNOSIS — Z961 Presence of intraocular lens: Secondary | ICD-10-CM | POA: Diagnosis not present

## 2015-09-15 DIAGNOSIS — M75121 Complete rotator cuff tear or rupture of right shoulder, not specified as traumatic: Secondary | ICD-10-CM | POA: Diagnosis not present

## 2015-09-23 DIAGNOSIS — G4733 Obstructive sleep apnea (adult) (pediatric): Secondary | ICD-10-CM | POA: Diagnosis not present

## 2015-10-21 DIAGNOSIS — Z79899 Other long term (current) drug therapy: Secondary | ICD-10-CM | POA: Diagnosis not present

## 2015-10-21 DIAGNOSIS — Z125 Encounter for screening for malignant neoplasm of prostate: Secondary | ICD-10-CM | POA: Diagnosis not present

## 2015-10-21 DIAGNOSIS — M25511 Pain in right shoulder: Secondary | ICD-10-CM | POA: Diagnosis not present

## 2015-10-21 DIAGNOSIS — E782 Mixed hyperlipidemia: Secondary | ICD-10-CM | POA: Diagnosis not present

## 2015-10-24 DIAGNOSIS — G4733 Obstructive sleep apnea (adult) (pediatric): Secondary | ICD-10-CM | POA: Diagnosis not present

## 2015-10-24 DIAGNOSIS — M25511 Pain in right shoulder: Secondary | ICD-10-CM | POA: Diagnosis not present

## 2015-10-27 DIAGNOSIS — M25511 Pain in right shoulder: Secondary | ICD-10-CM | POA: Diagnosis not present

## 2015-10-28 DIAGNOSIS — Z0000 Encounter for general adult medical examination without abnormal findings: Secondary | ICD-10-CM | POA: Diagnosis not present

## 2015-10-28 DIAGNOSIS — L820 Inflamed seborrheic keratosis: Secondary | ICD-10-CM | POA: Diagnosis not present

## 2015-10-30 DIAGNOSIS — M25511 Pain in right shoulder: Secondary | ICD-10-CM | POA: Diagnosis not present

## 2015-11-03 DIAGNOSIS — R4189 Other symptoms and signs involving cognitive functions and awareness: Secondary | ICD-10-CM | POA: Diagnosis not present

## 2015-11-03 DIAGNOSIS — G4733 Obstructive sleep apnea (adult) (pediatric): Secondary | ICD-10-CM | POA: Diagnosis not present

## 2015-11-03 DIAGNOSIS — G20 Parkinson's disease: Secondary | ICD-10-CM | POA: Diagnosis not present

## 2015-11-04 DIAGNOSIS — M25511 Pain in right shoulder: Secondary | ICD-10-CM | POA: Diagnosis not present

## 2015-11-06 DIAGNOSIS — M25511 Pain in right shoulder: Secondary | ICD-10-CM | POA: Diagnosis not present

## 2015-11-10 DIAGNOSIS — G4733 Obstructive sleep apnea (adult) (pediatric): Secondary | ICD-10-CM | POA: Diagnosis not present

## 2015-11-10 DIAGNOSIS — G20 Parkinson's disease: Secondary | ICD-10-CM | POA: Diagnosis not present

## 2015-11-10 DIAGNOSIS — E782 Mixed hyperlipidemia: Secondary | ICD-10-CM | POA: Diagnosis not present

## 2015-11-10 DIAGNOSIS — Z9989 Dependence on other enabling machines and devices: Secondary | ICD-10-CM | POA: Diagnosis not present

## 2015-11-10 DIAGNOSIS — I10 Essential (primary) hypertension: Secondary | ICD-10-CM | POA: Diagnosis not present

## 2015-11-10 DIAGNOSIS — F330 Major depressive disorder, recurrent, mild: Secondary | ICD-10-CM | POA: Diagnosis not present

## 2015-11-11 DIAGNOSIS — M25511 Pain in right shoulder: Secondary | ICD-10-CM | POA: Diagnosis not present

## 2015-11-14 DIAGNOSIS — M25511 Pain in right shoulder: Secondary | ICD-10-CM | POA: Diagnosis not present

## 2015-11-18 DIAGNOSIS — G4733 Obstructive sleep apnea (adult) (pediatric): Secondary | ICD-10-CM | POA: Diagnosis not present

## 2015-11-23 DIAGNOSIS — G4733 Obstructive sleep apnea (adult) (pediatric): Secondary | ICD-10-CM | POA: Diagnosis not present

## 2015-12-20 ENCOUNTER — Inpatient Hospital Stay
Admission: EM | Admit: 2015-12-20 | Discharge: 2015-12-22 | DRG: 066 | Disposition: A | Discharge status: Home Health | Payer: PPO | Attending: Internal Medicine | Admitting: Internal Medicine

## 2015-12-20 ENCOUNTER — Emergency Department: Payer: PPO

## 2015-12-20 DIAGNOSIS — I10 Essential (primary) hypertension: Secondary | ICD-10-CM | POA: Diagnosis not present

## 2015-12-20 DIAGNOSIS — I639 Cerebral infarction, unspecified: Secondary | ICD-10-CM | POA: Diagnosis not present

## 2015-12-20 DIAGNOSIS — F32A Depression, unspecified: Secondary | ICD-10-CM | POA: Diagnosis present

## 2015-12-20 DIAGNOSIS — I6359 Cerebral infarction due to unspecified occlusion or stenosis of other cerebral artery: Secondary | ICD-10-CM | POA: Diagnosis not present

## 2015-12-20 DIAGNOSIS — R29700 NIHSS score 0: Secondary | ICD-10-CM | POA: Diagnosis not present

## 2015-12-20 DIAGNOSIS — Z7982 Long term (current) use of aspirin: Secondary | ICD-10-CM

## 2015-12-20 DIAGNOSIS — K219 Gastro-esophageal reflux disease without esophagitis: Secondary | ICD-10-CM | POA: Diagnosis not present

## 2015-12-20 DIAGNOSIS — I63342 Cerebral infarction due to thrombosis of left cerebellar artery: Secondary | ICD-10-CM | POA: Diagnosis not present

## 2015-12-20 DIAGNOSIS — Z85820 Personal history of malignant melanoma of skin: Secondary | ICD-10-CM | POA: Diagnosis not present

## 2015-12-20 DIAGNOSIS — G8321 Monoplegia of upper limb affecting right dominant side: Secondary | ICD-10-CM | POA: Diagnosis not present

## 2015-12-20 DIAGNOSIS — G459 Transient cerebral ischemic attack, unspecified: Secondary | ICD-10-CM

## 2015-12-20 DIAGNOSIS — Z8546 Personal history of malignant neoplasm of prostate: Secondary | ICD-10-CM

## 2015-12-20 DIAGNOSIS — F329 Major depressive disorder, single episode, unspecified: Secondary | ICD-10-CM | POA: Diagnosis present

## 2015-12-20 DIAGNOSIS — R29818 Other symptoms and signs involving the nervous system: Secondary | ICD-10-CM | POA: Diagnosis not present

## 2015-12-20 LAB — COMPREHENSIVE METABOLIC PANEL
ALT: 10 U/L — ABNORMAL LOW (ref 17–63)
ANION GAP: 5 (ref 5–15)
AST: 26 U/L (ref 15–41)
Albumin: 4 g/dL (ref 3.5–5.0)
Alkaline Phosphatase: 66 U/L (ref 38–126)
BILIRUBIN TOTAL: 0.5 mg/dL (ref 0.3–1.2)
BUN: 22 mg/dL — ABNORMAL HIGH (ref 6–20)
CALCIUM: 10.1 mg/dL (ref 8.9–10.3)
CO2: 28 mmol/L (ref 22–32)
Chloride: 102 mmol/L (ref 101–111)
Creatinine, Ser: 1.33 mg/dL — ABNORMAL HIGH (ref 0.61–1.24)
GFR calc Af Amer: 56 mL/min — ABNORMAL LOW (ref >60)
GFR, EST NON AFRICAN AMERICAN: 48 mL/min — AB (ref >60)
Glucose, Bld: 118 mg/dL — ABNORMAL HIGH (ref 65–99)
POTASSIUM: 3.3 mmol/L — AB (ref 3.5–5.1)
Sodium: 135 mmol/L (ref 135–145)
TOTAL PROTEIN: 6.5 g/dL (ref 6.5–8.1)

## 2015-12-20 LAB — URINALYSIS COMPLETE WITH MICROSCOPIC (ARMC ONLY)
BILIRUBIN URINE: NEGATIVE
Bacteria, UA: NONE SEEN
Glucose, UA: NEGATIVE mg/dL
Hgb urine dipstick: NEGATIVE
KETONES UR: NEGATIVE mg/dL
LEUKOCYTES UA: NEGATIVE
Nitrite: NEGATIVE
PH: 6 (ref 5.0–8.0)
PROTEIN: NEGATIVE mg/dL
SPECIFIC GRAVITY, URINE: 1.008 (ref 1.005–1.030)
Squamous Epithelial / LPF: NONE SEEN

## 2015-12-20 LAB — DIFFERENTIAL
BASOS ABS: 0.1 10*3/uL (ref 0–0.1)
Basophils Relative: 1 %
EOS ABS: 0.8 10*3/uL — AB (ref 0–0.7)
LYMPHS ABS: 2 10*3/uL (ref 1.0–3.6)
Lymphocytes Relative: 17 %
MONO ABS: 1 10*3/uL (ref 0.2–1.0)
Neutro Abs: 8.1 10*3/uL — ABNORMAL HIGH (ref 1.4–6.5)
Neutrophils Relative %: 67 %

## 2015-12-20 LAB — GLUCOSE, CAPILLARY: GLUCOSE-CAPILLARY: 115 mg/dL — AB (ref 65–99)

## 2015-12-20 LAB — PROTIME-INR
INR: 1.16
Prothrombin Time: 15 seconds (ref 11.4–15.0)

## 2015-12-20 LAB — URINE DRUG SCREEN, QUALITATIVE (ARMC ONLY)
Amphetamines, Ur Screen: NOT DETECTED
Barbiturates, Ur Screen: NOT DETECTED
Benzodiazepine, Ur Scrn: NOT DETECTED
CANNABINOID 50 NG, UR ~~LOC~~: NOT DETECTED
Cocaine Metabolite,Ur ~~LOC~~: NOT DETECTED
MDMA (ECSTASY) UR SCREEN: NOT DETECTED
Methadone Scn, Ur: NOT DETECTED
OPIATE, UR SCREEN: NOT DETECTED
PHENCYCLIDINE (PCP) UR S: NOT DETECTED
Tricyclic, Ur Screen: NOT DETECTED

## 2015-12-20 LAB — ETHANOL: Alcohol, Ethyl (B): <5 mg/dL (ref <5)

## 2015-12-20 LAB — CBC
HEMATOCRIT: 40.2 % (ref 40.0–52.0)
Hemoglobin: 13.7 g/dL (ref 13.0–18.0)
MCH: 30.4 pg (ref 26.0–34.0)
MCHC: 34.1 g/dL (ref 32.0–36.0)
MCV: 89.2 fL (ref 80.0–100.0)
Platelets: 262 10*3/uL (ref 150–440)
RBC: 4.51 MIL/uL (ref 4.40–5.90)
RDW: 15 % — AB (ref 11.5–14.5)
WBC: 12.1 10*3/uL — ABNORMAL HIGH (ref 3.8–10.6)

## 2015-12-20 LAB — APTT: APTT: 29 s (ref 24–36)

## 2015-12-20 MED ORDER — ENOXAPARIN SODIUM 40 MG/0.4ML ~~LOC~~ SOLN
40.0000 mg | SUBCUTANEOUS | Status: DC
  Administered 2015-12-21: 21:00:00 40 mg via SUBCUTANEOUS
  Filled 2015-12-20: qty 0.4

## 2015-12-20 MED ORDER — STROKE: EARLY STAGES OF RECOVERY BOOK
Freq: Once | Status: AC
  Administered 2015-12-21

## 2015-12-20 MED ORDER — DULOXETINE HCL 60 MG PO CPEP
60.0000 mg | ORAL_CAPSULE | Freq: Every day | ORAL | Status: DC
  Administered 2015-12-21 – 2015-12-22 (×2): 60 mg via ORAL
  Filled 2015-12-20 (×2): qty 1

## 2015-12-20 MED ORDER — DULOXETINE HCL 20 MG PO CPEP: 20.0000 mg | ORAL_CAPSULE | Freq: Every day | ORAL | Status: DC

## 2015-12-20 MED ORDER — ASPIRIN 81 MG PO CHEW
81.0000 mg | CHEWABLE_TABLET | Freq: Every day | ORAL | Status: DC
  Administered 2015-12-21: 09:00:00 81 mg via ORAL
  Filled 2015-12-20: qty 1

## 2015-12-20 MED ORDER — DONEPEZIL HCL 5 MG PO TABS
5.0000 mg | ORAL_TABLET | Freq: Every day | ORAL | Status: DC
  Administered 2015-12-21 (×2): 5 mg via ORAL
  Filled 2015-12-20 (×2): qty 1

## 2015-12-20 MED ORDER — CARBIDOPA-LEVODOPA 25-100 MG PO TABS
1.5000 | ORAL_TABLET | Freq: Three times a day (TID) | ORAL | Status: DC
  Administered 2015-12-21 – 2015-12-22 (×6): 1.5 via ORAL
  Filled 2015-12-20 (×3): qty 1.5
  Filled 2015-12-20 (×2): qty 2
  Filled 2015-12-20 (×2): qty 1.5
  Filled 2015-12-20: qty 2

## 2015-12-20 NOTE — H&P (Signed)
[facility] NAME: Fred Jones    MR#:  [id number]  DATE OF BIRTH:  [birth year]  DATE OF ADMISSION:  12/20/2015  PRIMARY CARE PHYSICIAN: Rusty Aus, MD   REQUESTING/REFERRING PHYSICIAN: Burlene Arnt, MD  CHIEF COMPLAINT:   Chief Complaint  Patient presents with  . Code Stroke    HISTORY OF PRESENT ILLNESS:  Jak Mill  is a 82 y.o. male who presents with An episode of right hand weakness. He states that this began about 8:00 this evening when he was trying to tie his shoe. He was unable to control his fingers or "make him work right." They came straight to the ED for evaluation. On arrival here it seems that his symptoms were improving, and by the time this writer interviewed him symptoms had resolved completely. No prior history of stroke or TIA. Initial workup in the ED was negative.  Telemetry neurologist recommended admission and stroke workup.  PAST MEDICAL HISTORY:   Past Medical History  Diagnosis Date  . Hypertension     Echo s/p fall 11/15  . Arthritis     fingers, neck  . GERD (gastroesophageal reflux disease)     rare  . Depression   . Cancer of prostate ([place])   . Cancer of skin     PAST SURGICAL HISTORY:   Past Surgical History  Procedure Laterality Date  . Prostate surgery  2007  . Hernia repair  1999  . Kidney stone surgery    . Melanoma excision    . Eye surgery Left 09/25/14    cataract @[facility]   . Cataract extraction w/phaco Right 11/27/2014    Procedure: CATARACT EXTRACTION PHACO AND INTRAOCULAR LENS PLACEMENT ([place]);  Surgeon: Leandrew Koyanagi, MD;  Location: [facility];  Service: Ophthalmology;  Laterality: Right;    SOCIAL HISTORY:   Social History  Substance Use Topics  . Smoking status: Never Smoker   . Smokeless tobacco: Not on file  . Alcohol Use: 3.0 oz/week    5 Glasses of wine per week    FAMILY HISTORY:   Family History  Problem Relation Age of Onset  . Cancer     . Stroke    . Heart attack      DRUG ALLERGIES:  No Known Allergies  MEDICATIONS AT HOME:   Prior to Admission medications   Medication Sig Start Date End Date Taking? Authorizing Provider  aspirin 81 MG tablet Take 81 mg by mouth daily. AM    Historical Provider, MD  Calcium Citrate (CITRACAL PO) Take by mouth 2 (two) times daily.    Historical Provider, MD  DULoxetine (CYMBALTA) 20 MG capsule Take 20 mg by mouth daily. AM    Historical Provider, MD  losartan (COZAAR) 25 MG tablet Take 25 mg by mouth daily. AM    Historical Provider, MD  Multiple Vitamin (MULTIVITAMIN) capsule Take 1 capsule by mouth daily. AM    Historical Provider, MD    REVIEW OF SYSTEMS:  Review of Systems  Constitutional: Negative for fever, chills, weight loss and malaise/fatigue.  HENT: Negative for ear pain, hearing loss and tinnitus.   Eyes: Negative for blurred vision, double vision, pain and redness.  Respiratory: Negative for cough, hemoptysis and shortness of breath.   Cardiovascular: Negative for chest pain, palpitations, orthopnea and leg swelling.  Gastrointestinal: Negative for nausea, vomiting, abdominal pain, diarrhea and constipation.  Genitourinary: Negative for dysuria, frequency and hematuria.  Musculoskeletal: Negative for back pain, joint  pain and neck pain.  Skin:       No acne, rash, or lesions  Neurological: Positive for focal weakness. Negative for dizziness, tremors and weakness.  Endo/Heme/Allergies: Negative for polydipsia. Does not bruise/bleed easily.  Psychiatric/Behavioral: Negative for depression. The patient is not nervous/anxious and does not have insomnia.      VITAL SIGNS:   Filed Vitals:   12/20/15 2113 12/20/15 2114 12/20/15 2115 12/20/15 2115  BP: 122/69     Pulse:  95 57   Temp:    98 F (36.7 C)  Resp:   15   Height:      Weight:      SpO2:  88% 98%    Wt Readings from Last 3 Encounters:  12/20/15 74.39 kg (164 lb)  11/27/14 77.565 kg (171 lb)     PHYSICAL EXAMINATION:  Physical Exam  Vitals reviewed. Constitutional: He is oriented to person, place, and time. He appears well-developed and well-nourished. No distress.  HENT:  Head: Normocephalic and atraumatic.  Mouth/Throat: Oropharynx is clear and moist.  Eyes: Conjunctivae and EOM are normal. Pupils are equal, round, and reactive to light. No scleral icterus.  Neck: Normal range of motion. Neck supple. No JVD present. No thyromegaly present.  Cardiovascular: Normal rate, regular rhythm and intact distal pulses.  Exam reveals no gallop and no friction rub.   No murmur heard. Respiratory: Effort normal and breath sounds normal. No respiratory distress. He has no wheezes. He has no rales.  GI: Soft. Bowel sounds are normal. He exhibits no distension. There is no tenderness.  Musculoskeletal: Normal range of motion. He exhibits no edema.  No arthritis, no gout  Lymphadenopathy:    He has no cervical adenopathy.  Neurological: He is alert and oriented to person, place, and time. No cranial nerve deficit.  Neurologic: Cranial nerves II-XII intact, Sensation intact to light touch/pinprick, 5/5 strength in all extremities, no dysarthria, no aphasia, no dysphagia, memory intact, finger to nose testing showed no abnormality, no pronator drift, DTR intact, Babinski sign not present.   Skin: Skin is warm and dry. No rash noted. No erythema.  Psychiatric: He has a normal mood and affect. His behavior is normal. Judgment and thought content normal.    LABORATORY PANEL:   CBC  Recent Labs Lab 12/20/15 2116  WBC 12.1*  HGB 13.7  HCT 40.2  PLT 262   ------------------------------------------------------------------------------------------------------------------  Chemistries   Recent Labs Lab 12/20/15 2116  NA 135  K 3.3*  CL 102  CO2 28  GLUCOSE 118*  BUN 22*  CREATININE 1.33*  CALCIUM 10.1  AST 26  ALT 10*  ALKPHOS 66  BILITOT 0.5    ------------------------------------------------------------------------------------------------------------------  Cardiac Enzymes No results for input(s): TROPONINI in the last 168 hours. ------------------------------------------------------------------------------------------------------------------  RADIOLOGY:  Ct Head Wo Contrast  12/20/2015  CLINICAL DATA:  Code stroke. Acute onset of right arm weakness and numbness. Initial encounter. EXAM: CT HEAD WITHOUT CONTRAST TECHNIQUE: Contiguous axial images were obtained from the base of the skull through the vertex without intravenous contrast. COMPARISON:  CT of the head performed 06/13/2014, and MRI of the brain performed 06/14/2014 FINDINGS: There is no evidence of acute infarction, mass lesion, or intra- or extra-axial hemorrhage on CT. Prominence of the ventricles and sulci reflects moderate cortical volume loss. Mild cerebellar atrophy is noted. The brainstem and fourth ventricle are within normal limits. The basal ganglia are unremarkable in appearance. The cerebral hemispheres demonstrate grossly normal gray-white differentiation. No mass effect or midline shift  is seen. There is no evidence of fracture; visualized osseous structures are unremarkable in appearance. The orbits are within normal limits. The paranasal sinuses and mastoid air cells are well-aerated. No significant soft tissue abnormalities are seen. IMPRESSION: 1. No acute intracranial pathology seen on CT. 2. Moderate cortical volume loss noted. These results were called by telephone at the time of interpretation on 12/20/2015 at 9:10 pm to Dr. Charlotte Crumb, who verbally acknowledged these results. Electronically Signed   By: Garald Balding M.D.   On: 12/20/2015 21:10    EKG:   Orders placed or performed during the hospital encounter of 12/20/15  . ED EKG  . ED EKG  . EKG 12-Lead  . EKG 12-Lead    IMPRESSION AND PLAN:  Principal Problem:   Stroke (cerebrum) (HCC) -  symptoms now resolved. We'll admit with stroke order set including appropriate imaging and labs with neurology consult. Active Problems:   HTN (hypertension) - permissive hypertension tonight, hold home antihypertensives for now, blood pressure goal tonight less than 220/120.   GERD (gastroesophageal reflux disease) - not on home meds for this, treat when necessary   Depression - continue home meds  All the records are reviewed and case discussed with ED provider. Management plans discussed with the patient and/or family.  DVT PROPHYLAXIS: SubQ lovenox  GI PROPHYLAXIS: None  ADMISSION STATUS: Inpatient  CODE STATUS: Full Code Status History    This patient does not have a recorded code status. Please follow your organizational policy for patients in this situation.      TOTAL TIME TAKING CARE OF THIS PATIENT: 45 minutes.    Kemisha Bonnette FIELDING 12/20/2015, 10:24 PM  [facility]  Office  [phone]  CC: Primary care physician; Rusty Aus, MD

## 2015-12-20 NOTE — ED Provider Notes (Addendum)
[facility] Emergency Department Provider Note  ____________________________________________   I have reviewed the triage vital signs and the nursing notes.   HISTORY  Chief Complaint Code Stroke    HPI Fred Jones. is a 82 y.o. male with a history of hypertension and arthritis. Remote history of prostate cancer presents today with right hand weakness. Started a partially 30 mins prior to arrival at 8:30 PM. Patient was tying his shoe noticed that he had some weakness in his right hand. He does not feel that he had much weakness in his right arm, just the hand, no right lower extremity weakness, no difficulty smiling, or talking. Did not have any close head injury or headache. And he feels that the strength is rapidly coming back to that hand.      Past Medical History  Diagnosis Date  . Hypertension     Echo s/p fall 11/15  . Arthritis     fingers, neck  . GERD (gastroesophageal reflux disease)     rare  . Depression   . Cancer of prostate ([place])   . Cancer of skin     There are no active problems to display for this patient.   Past Surgical History  Procedure Laterality Date  . Prostate surgery  2007  . Hernia repair  1999  . Kidney stone surgery    . Melanoma excision    . Eye surgery Left 09/25/14    cataract @[facility]   . Cataract extraction w/phaco Right 11/27/2014    Procedure: CATARACT EXTRACTION PHACO AND INTRAOCULAR LENS PLACEMENT ([facility]);  Surgeon: Leandrew Koyanagi, MD;  Location: [facility];  Service: Ophthalmology;  Laterality: Right;    Current Outpatient Rx  Name  Route  Sig  Dispense  Refill  . aspirin 81 MG tablet   Oral   Take 81 mg by mouth daily. AM         . Calcium Citrate (CITRACAL PO)   Oral   Take by mouth 2 (two) times daily.         . DULoxetine (CYMBALTA) 20 MG capsule   Oral   Take 20 mg by mouth daily. AM         . losartan (COZAAR) 25 MG tablet   Oral   Take 25 mg by mouth daily. AM          . Multiple Vitamin (MULTIVITAMIN) capsule   Oral   Take 1 capsule by mouth daily. AM           Allergies Review of patient's allergies indicates no known allergies.  No family history on file.  Social History Social History  Substance Use Topics  . Smoking status: Never Smoker   . Smokeless tobacco: None  . Alcohol Use: 3.0 oz/week    5 Glasses of wine per week    Review of Systems Constitutional: No fever/chills Eyes: No visual changes. ENT: No sore throat. No stiff neck no neck pain Cardiovascular: Denies chest pain. Respiratory: Denies shortness of breath. Gastrointestinal:   no vomiting.  No diarrhea.  No constipation. Genitourinary: Negative for dysuria. Musculoskeletal: Negative lower extremity swelling Skin: Negative for rash. Neurological: Negative for headaches, see history of present illness 10-point ROS otherwise negative.  ____________________________________________   PHYSICAL EXAM:  VITAL SIGNS: ED Triage Vitals  Enc Vitals Group     BP --      Pulse --      Resp --      Temp --  Temp src --      SpO2 --      Weight 12/20/15 2109 164 lb (74.39 kg)     Height 12/20/15 2109 5\' 7"  (1.702 m)     Head Cir --      Peak Flow --      Pain Score --      Pain Loc --      Pain Edu? --      Excl. in [place]? --     Constitutional: Alert and oriented. Well appearing and in no acute distress. Eyes: Conjunctivae are normal. PERRL. EOMI. Head: Atraumatic. Nose: No congestion/rhinnorhea. Mouth/Throat: Mucous membranes are moist.  Oropharynx non-erythematous. Neck: No stridor.   Nontender with no meningismus Cardiovascular: Normal rate, regular rhythm. Grossly normal heart sounds.  Good peripheral circulation. Respiratory: Normal respiratory effort.  No retractions. Lungs CTAB. Abdominal: Soft and nontender. No distention. No guarding no rebound Back:  There is no focal tenderness or step off there is no midline tenderness there are no lesions  noted. there is no CVA tenderness Musculoskeletal: No lower extremity tenderness. No joint effusions, no DVT signs strong distal pulses no edema Neurologic:  Cranial nerves II through XII are grossly intact patient is 5 out of 5 strength bilateral lower extremity, he has some mild difference in grip strength on the right versus the left, slightly weaker there however on push pull he seems to be about equal on the right and the left.. Finger to nose within normal limits heel to shin within normal limits, speech is normal with no word finding difficulty or dysarthria, reflexes symmetric, pupils are equally round and reactive to light, there is no pronator drift, sensation is normal, vision is intact to confrontation, gait is deferred, there is no nystagmus, normal neurologic exam Skin:  Skin is warm, dry and intact. No rash noted. Psychiatric: Mood and affect are normal. Speech and behavior are normal.  ____________________________________________   LABS (all labs ordered are listed, but only abnormal results are displayed)  Labs Reviewed  ETHANOL  PROTIME-INR  APTT  CBC  DIFFERENTIAL  COMPREHENSIVE METABOLIC PANEL  URINE RAPID DRUG SCREEN, HOSP PERFORMED  URINALYSIS COMPLETEWITH MICROSCOPIC ([facility])   ____________________________________________  EKG  I personally interpreted any EKGs ordered by me or triage Sinus rhythm rate 59 bpm no acute ST elevation or acute ST depression, nonspecific ST changes noted. ____________________________________________  [id number]  I reviewed any imaging ordered by me or triage that were performed during my shift and, if possible, patient and/or family made aware of any abnormal findings. ____________________________________________   PROCEDURES  Procedure(s) performed: None  Critical Care performed: None  ____________________________________________   INITIAL IMPRESSION / ASSESSMENT AND PLAN / ED COURSE  Pertinent labs & imaging results  that were available during my care of the patient were reviewed by me and considered in my medical decision making (see chart for details).  We called the patient a code stroke although he has rapidly were resolving and minimally isolated symptoms. This could be a radiculopathy as it is his opinion only affects his hands he does seem to have good upper arm strength. Nonetheless, CT is negative. We will await input from neurology.   ----------------------------------------- 9:32 PM on 12/20/2015 -----------------------------------------  CT scan negative seen by neurology who do not feel he is a candidate for TPA. I concur. ____________________________________________   FINAL CLINICAL IMPRESSION(S) / ED DIAGNOSES  Final diagnoses:  None      This chart was dictated using voice recognition software.  Despite best efforts to proofread,  errors can occur which can change meaning.     Schuyler Amor, MD 12/20/15 2119  Schuyler Amor, MD 12/20/15 2133

## 2015-12-20 NOTE — ED Notes (Signed)
Pt reports that most symptoms have now resolved

## 2015-12-20 NOTE — ED Notes (Signed)
Patient transported to CT with Shirlee Limerick, RN.

## 2015-12-20 NOTE — ED Notes (Signed)
Pt from home via EMS, reports R arm numbness/ weakness that began at 2030 no other deficits noted

## 2015-12-21 ENCOUNTER — Inpatient Hospital Stay: Payer: PPO

## 2015-12-21 ENCOUNTER — Inpatient Hospital Stay
Admit: 2015-12-21 | Discharge: 2015-12-21 | Disposition: A | Discharge status: Home / Self Care | Payer: PPO | Attending: Internal Medicine | Admitting: Internal Medicine

## 2015-12-21 DIAGNOSIS — I63532 Cerebral infarction due to unspecified occlusion or stenosis of left posterior cerebral artery: Secondary | ICD-10-CM | POA: Diagnosis not present

## 2015-12-21 DIAGNOSIS — I6523 Occlusion and stenosis of bilateral carotid arteries: Secondary | ICD-10-CM | POA: Diagnosis not present

## 2015-12-21 DIAGNOSIS — I63342 Cerebral infarction due to thrombosis of left cerebellar artery: Secondary | ICD-10-CM

## 2015-12-21 DIAGNOSIS — I10 Essential (primary) hypertension: Secondary | ICD-10-CM | POA: Diagnosis not present

## 2015-12-21 DIAGNOSIS — I639 Cerebral infarction, unspecified: Secondary | ICD-10-CM | POA: Diagnosis not present

## 2015-12-21 LAB — LIPID PANEL
Cholesterol: 179 mg/dL (ref 0–200)
HDL: 28 mg/dL — ABNORMAL LOW (ref >40)
LDL CALC: 129 mg/dL — AB (ref 0–99)
TRIGLYCERIDES: 108 mg/dL (ref <150)
Total CHOL/HDL Ratio: 6.4 RATIO
VLDL: 22 mg/dL (ref 0–40)

## 2015-12-21 LAB — ECHOCARDIOGRAM COMPLETE
HEIGHTINCHES: 67 in
WEIGHTICAEL: 2624 [oz_av]

## 2015-12-21 LAB — HEMOGLOBIN A1C: Hgb A1c MFr Bld: 6 % (ref 4.0–6.0)

## 2015-12-21 MED ORDER — ATORVASTATIN CALCIUM 40 MG PO TABS: 40.0000 mg | ORAL_TABLET | Freq: Every day | ORAL | Status: DC

## 2015-12-21 MED ORDER — ATORVASTATIN CALCIUM 20 MG PO TABS
40.0000 mg | ORAL_TABLET | Freq: Every day | ORAL | Status: DC
Start: 2015-12-21 — End: 2015-12-22
  Administered 2015-12-21: 40 mg via ORAL
  Filled 2015-12-21: qty 2

## 2015-12-21 MED ORDER — ASPIRIN EC 325 MG PO TBEC: 325.0000 mg | DELAYED_RELEASE_TABLET | Freq: Every day | ORAL | Status: DC

## 2015-12-21 MED ORDER — CLOPIDOGREL BISULFATE 75 MG PO TABS
75.0000 mg | ORAL_TABLET | Freq: Every day | ORAL | Status: DC
  Administered 2015-12-21 – 2015-12-22 (×2): 75 mg via ORAL
  Filled 2015-12-21 (×2): qty 1

## 2015-12-21 NOTE — Discharge Instructions (Addendum)
°  DIET:  Cardiac diet  DISCHARGE CONDITION:  Stable  ACTIVITY:  Activity as tolerated  OXYGEN:  Home Oxygen: No.   Oxygen Delivery: room air  DISCHARGE LOCATION:  home   If you experience worsening of your admission symptoms, develop shortness of breath, life threatening emergency, suicidal or homicidal thoughts you must seek medical attention immediately by calling 911 or calling your MD immediately  if symptoms less severe.  You Must read complete instructions/literature along with all the possible adverse reactions/side effects for all the Medicines you take and that have been prescribed to you. Take any new Medicines after you have completely understood and accpet all the possible adverse reactions/side effects.   Please note  You were cared for by a hospitalist during your hospital stay. If you have any questions about your discharge medications or the care you received while you were in the hospital after you are discharged, you can call the unit and asked to speak with the hospitalist on call if the hospitalist that took care of you is not available. Once you are discharged, your primary care physician will handle any further medical issues. Please note that NO REFILLS for any discharge medications will be authorized once you are discharged, as it is imperative that you return to your primary care physician (or establish a relationship with a primary care physician if you do not have one) for your aftercare needs so that they can reassess your need for medications and monitor your lab values.   No driving till you see your doctor as outpatient.  Physical and occupational therapy as outpatient

## 2015-12-21 NOTE — Evaluation (Signed)
Physical Therapy Evaluation Patient Details Name: Fred BIERMANN Sr. MRN: [id number] DOB: 1931/04/04 Today's Date: 12/21/2015   History of Present Illness  82 y.o. male who presents with An episode of right hand weakness. He states that this began about 8:00 this evening when he was trying to tie his shoe. He was unable to control his fingers or "make him work right." They came straight to the ED for evaluation. On arrival here it seems that his symptoms were improving.  Symptoms have largely resolved  Clinical Impression  Pt did well with PT and though he is not quite at his baseline with ambulation speed he feels confident that he can go home and work on things on his own.  He did not have any safety concerns and generally did well and had = strength bilaterally (though his writing coordination was decreased.).  Pt does not require further PT intervention.     Follow Up Recommendations No PT follow up    Equipment Recommendations       Recommendations for Other Services OT consult     Precautions / Restrictions Restrictions Weight Bearing Restrictions: No      Mobility  Bed Mobility Overal bed mobility: Independent                Transfers Overall transfer level: Independent Equipment used: None             General transfer comment: Pt is able to able to rise to standing w/o AD.   Ambulation/Gait Ambulation/Gait assistance: Independent Ambulation Distance (Feet): 400 Feet Assistive device: Rolling walker (2 wheeled)       General Gait Details: Pt is walking slower than his baseline, but ultiamtely he is safe and steady with ambulation.  He does not have hesitancy or limp on R.  Stairs            Wheelchair Mobility    Modified Rankin (Stroke Patients Only)       Balance Overall balance assessment: Independent                                           Pertinent Vitals/Pain Pain Assessment: No/denies pain    Home Living  Family/patient expects to be discharged to:: Private residence Living Arrangements: Spouse/significant other Available Help at Discharge: Family Type of Home: House Home Access: Level entry              Prior Function Level of Independence: Independent         Comments: Pt is working and able to be active and independent     Hand Dominance   Dominant Hand: Right    Extremity/Trunk Assessment   Upper Extremity Assessment: Overall WFL for tasks assessed (except for writting with R hand is not at baseline)           Lower Extremity Assessment: Defer to PT evaluation         Communication   Communication: No difficulties  Cognition Arousal/Alertness: Awake/alert Behavior During Therapy: WFL for tasks assessed/performed Overall Cognitive Status: Within Functional Limits for tasks assessed                      General Comments      Exercises        Assessment/Plan    PT Assessment Patent does not need any further PT services  PT Diagnosis  Difficulty walking;Generalized weakness   PT Problem List    PT Treatment Interventions     PT Goals (Current goals can be found in the Care Plan section) Acute Rehab PT Goals Patient Stated Goal: go home today    Frequency     Barriers to discharge        Co-evaluation               End of Session Equipment Utilized During Treatment: Gait belt Activity Tolerance: Patient tolerated treatment well Patient left: with chair alarm set;with call bell/phone within reach;with family/visitor present           Time: [id number] PT Time Calculation (min) (ACUTE ONLY): 19 min   Charges:   PT Evaluation $PT Eval Low Complexity: 1 Procedure     PT G CodesKreg Shropshire, DPT 12/21/2015, 11:49 AM

## 2015-12-21 NOTE — Progress Notes (Signed)
*  PRELIMINARY RESULTS* Echocardiogram 2D Echocardiogram has been performed.  Fred Jones 12/21/2015, 7:59 AM

## 2015-12-21 NOTE — Consult Note (Signed)
CC: R hand weakness   HPI: Fred ROMM Sr. is an 82 y.o. male  who presents with An episode of right hand weakness. He states that this began about 8:00 last evening when he was trying to tie his shoe. He was unable to control his fingers or "make him work right." They came straight to the ED for evaluation. Currently back to baseline. Was on ASA 81. No acute pathology on [facility]  Past Medical History  Diagnosis Date  . Hypertension     Echo s/p fall 11/15  . Arthritis     fingers, neck  . GERD (gastroesophageal reflux disease)     rare  . Depression   . Cancer of prostate ([place])   . Cancer of skin     Past Surgical History  Procedure Laterality Date  . Prostate surgery  2007  . Hernia repair  1999  . Kidney stone surgery    . Melanoma excision    . Eye surgery Left 09/25/14    cataract @[facility]   . Cataract extraction w/phaco Right 11/27/2014    Procedure: CATARACT EXTRACTION PHACO AND INTRAOCULAR LENS PLACEMENT (IOC);  Surgeon: Leandrew Koyanagi, MD;  Location: [place];  Service: Ophthalmology;  Laterality: Right;    Family History  Problem Relation Age of Onset  . Cancer    . Stroke    . Heart attack      Social History:  reports that he has never smoked. He does not have any smokeless tobacco history on file. He reports that he drinks about 3.0 oz of alcohol per week. He reports that he does not use illicit drugs.  No Known Allergies  Medications: I have reviewed the patient's current medications.  ROS: History obtained from the patient  General ROS: negative for - chills, fatigue, fever, night sweats, weight gain or weight loss Psychological ROS: negative for - behavioral disorder, hallucinations, memory difficulties, mood swings or suicidal ideation Ophthalmic ROS: negative for - blurry vision, double vision, eye pain or loss of vision ENT ROS: negative for - epistaxis, nasal discharge, oral lesions, sore throat, tinnitus or vertigo Allergy and  Immunology ROS: negative for - hives or itchy/watery eyes Hematological and Lymphatic ROS: negative for - bleeding problems, bruising or swollen lymph nodes Endocrine ROS: negative for - galactorrhea, hair pattern changes, polydipsia/polyuria or temperature intolerance Respiratory ROS: negative for - cough, hemoptysis, shortness of breath or wheezing Cardiovascular ROS: negative for - chest pain, dyspnea on exertion, edema or irregular heartbeat Gastrointestinal ROS: negative for - abdominal pain, diarrhea, hematemesis, nausea/vomiting or stool incontinence Genito-Urinary ROS: negative for - dysuria, hematuria, incontinence or urinary frequency/urgency Musculoskeletal ROS: negative for - joint swelling or muscular weakness Neurological ROS: as noted in HPI Dermatological ROS: negative for rash and skin lesion changes  Physical Examination: Blood pressure 164/68, pulse 52, temperature 97.7 F (36.5 C), temperature source Oral, resp. rate 18, height 5\' 7"  (1.702 m), weight 74.39 kg (164 lb), SpO2 100 %.   Neurological Examination Mental Status: Alert, oriented, thought content appropriate.  Speech fluent without evidence of aphasia.  Able to follow 3 step commands without difficulty. Cranial Nerves: II: Discs flat bilaterally; Visual fields grossly normal, pupils equal, round, reactive to light and accommodation III,IV, VI: ptosis not present, extra-ocular motions intact bilaterally V,VII: smile symmetric, facial light touch sensation normal bilaterally VIII: hearing normal bilaterally IX,X: gag reflex present XI: bilateral shoulder shrug XII: midline tongue extension Motor: Right : Upper extremity   5/5  Left:     Upper extremity   5/5  Lower extremity   5/5     Lower extremity   5/5 Tone and bulk:normal tone throughout; no atrophy noted Sensory: Pinprick and light touch intact throughout, bilaterally Deep Tendon Reflexes: 1+ and symmetric throughout Plantars: Right:  downgoing   Left: downgoing Cerebellar: normal finger-to-nose, normal rapid alternating movements and normal heel-to-shin test Gait: normal gait and station      Laboratory Studies:   Basic Metabolic Panel:  Recent Labs Lab 12/20/15 2116  NA 135  K 3.3*  CL 102  CO2 28  GLUCOSE 118*  BUN 22*  CREATININE 1.33*  CALCIUM 10.1    Liver Function Tests:  Recent Labs Lab 12/20/15 2116  AST 26  ALT 10*  ALKPHOS 66  BILITOT 0.5  PROT 6.5  ALBUMIN 4.0   No results for input(s): LIPASE, AMYLASE in the last 168 hours. No results for input(s): AMMONIA in the last 168 hours.  CBC:  Recent Labs Lab 12/20/15 2116  WBC 12.1*  NEUTROABS 8.1*  HGB 13.7  HCT 40.2  MCV 89.2  PLT 262    Cardiac Enzymes: No results for input(s): CKTOTAL, CKMB, CKMBINDEX, TROPONINI in the last 168 hours.  BNP: Invalid input(s): POCBNP  CBG:  Recent Labs Lab 12/20/15 2115  GLUCAP 115*    Microbiology: No results found for this or any previous visit.  Coagulation Studies:  Recent Labs  12/20/15 2116  LABPROT 15.0  INR 1.16    Urinalysis:  Recent Labs Lab 12/20/15 2230  COLORURINE YELLOW*  LABSPEC 1.008  PHURINE 6.0  GLUCOSEU NEGATIVE  HGBUR NEGATIVE  BILIRUBINUR NEGATIVE  KETONESUR NEGATIVE  PROTEINUR NEGATIVE  NITRITE NEGATIVE  LEUKOCYTESUR NEGATIVE    Lipid Panel:     Component Value Date/Time   CHOL 179 12/21/2015 0441   CHOL 220* 06/14/2014 0422   TRIG 108 12/21/2015 0441   TRIG 214* 06/14/2014 0422   HDL 28* 12/21/2015 0441   HDL 28* 06/14/2014 0422   CHOLHDL 6.4 12/21/2015 0441   VLDL 22 12/21/2015 0441   VLDL 43* 06/14/2014 0422   LDLCALC 129* 12/21/2015 0441   LDLCALC 149* 06/14/2014 0422    HgbA1C: No results found for: HGBA1C  Urine Drug Screen:     Component Value Date/Time   LABOPIA NONE DETECTED 12/20/2015 2230   COCAINSCRNUR NONE DETECTED 12/20/2015 2230   LABBENZ NONE DETECTED 12/20/2015 2230   AMPHETMU NONE DETECTED 12/20/2015  2230   THCU NONE DETECTED 12/20/2015 2230   LABBARB NONE DETECTED 12/20/2015 2230    Alcohol Level:  Recent Labs Lab 12/20/15 2116  ETH <5    Other results: EKG: normal EKG, normal sinus rhythm, unchanged from previous tracings.  Imaging: Ct Head Wo Contrast  12/20/2015  CLINICAL DATA:  Code stroke. Acute onset of right arm weakness and numbness. Initial encounter. EXAM: CT HEAD WITHOUT CONTRAST TECHNIQUE: Contiguous axial images were obtained from the base of the skull through the vertex without intravenous contrast. COMPARISON:  CT of the head performed 06/13/2014, and MRI of the brain performed 06/14/2014 FINDINGS: There is no evidence of acute infarction, mass lesion, or intra- or extra-axial hemorrhage on CT. Prominence of the ventricles and sulci reflects moderate cortical volume loss. Mild cerebellar atrophy is noted. The brainstem and fourth ventricle are within normal limits. The basal ganglia are unremarkable in appearance. The cerebral hemispheres demonstrate grossly normal gray-white differentiation. No mass effect or midline shift is seen. There is no evidence of fracture; visualized osseous  structures are unremarkable in appearance. The orbits are within normal limits. The paranasal sinuses and mastoid air cells are well-aerated. No significant soft tissue abnormalities are seen. IMPRESSION: 1. No acute intracranial pathology seen on CT. 2. Moderate cortical volume loss noted. These results were called by telephone at the time of interpretation on 12/20/2015 at 9:10 pm to Dr. Charlotte Crumb, who verbally acknowledged these results. Electronically Signed   By: Garald Balding M.D.   On: 12/20/2015 21:10   US Carotid Bilateral  12/21/2015  CLINICAL DATA:  Acute stroke symptoms versus TIA. EXAM: BILATERAL CAROTID DUPLEX ULTRASOUND TECHNIQUE: Pearline Cables scale imaging, color Doppler and duplex ultrasound were performed of bilateral carotid and vertebral arteries in the neck. COMPARISON:  12/20/2015  CT FINDINGS: Criteria: Quantification of carotid stenosis is based on velocity parameters that correlate the residual internal carotid diameter with NASCET-based stenosis levels, using the diameter of the distal internal carotid lumen as the denominator for stenosis measurement. The following velocity measurements were obtained: RIGHT ICA:  84/18 cm/sec CCA:  [id number] cm/sec SYSTOLIC ICA/CCA RATIO:  0.8 DIASTOLIC ICA/CCA RATIO:  1.1 ECA:  214 cm/sec LEFT ICA:  81/17 cm/sec CCA:  [id number] cm/sec SYSTOLIC ICA/CCA RATIO:  0.9 DIASTOLIC ICA/CCA RATIO:  1.0 ECA:  316 cm/sec RIGHT CAROTID ARTERY: Minor echogenic shadowing plaque formation. No hemodynamically significant right ICA stenosis, velocity elevation, or turbulent flow. Degree of narrowing less than 50%. Incidental note of a moderate ECA origin stenosis. RIGHT VERTEBRAL ARTERY:  Antegrade LEFT CAROTID ARTERY: Similar scattered minor echogenic plaque formation. No hemodynamically significant left ICA stenosis, velocity elevation, or turbulent flow. Moderate left ECA stenosis also evident. LEFT VERTEBRAL ARTERY:  Antegrade IMPRESSION: Mild bilateral carotid atherosclerosis. No hemodynamically significant ICA stenosis. Degree of narrowing less than 50% bilaterally. Moderate ECA origin stenosis bilaterally. Patent antegrade vertebral flow bilaterally. Electronically Signed   By: Jerilynn Mages.  Shick M.D.   On: 12/21/2015 08:53     Assessment/Plan:  82 y.o. male  who presents with An episode of right hand weakness. He states that this began about 8:00 last evening when he was trying to tie his shoe. He was unable to control his fingers or "make him work right." They came straight to the ED for evaluation. Currently back to baseline. Was on ASA 81. No acute pathology on [facility]  - Would like to finish stroke work up - MRI/MRA - Increase ASA to 325 daily - can be d/c after stroke work up.   Leotis Pain  12/21/2015, 1:22 PM

## 2015-12-21 NOTE — Progress Notes (Signed)
Pt has own cpap which he self-initiates

## 2015-12-22 DIAGNOSIS — I639 Cerebral infarction, unspecified: Principal | ICD-10-CM

## 2015-12-22 DIAGNOSIS — I10 Essential (primary) hypertension: Secondary | ICD-10-CM | POA: Diagnosis not present

## 2015-12-22 MED ORDER — CLOPIDOGREL BISULFATE 75 MG PO TABS: 75.0000 mg | ORAL_TABLET | Freq: Every day | ORAL | Status: DC

## 2015-12-22 NOTE — Evaluation (Signed)
Occupational Therapy Evaluation Patient Details Name: Fred MASSUCCI Sr. MRN: [id number] DOB: [birth year] Today's Date: 12/22/2015    History of Present Illness 82 y.o. male who presents with An episode of right hand weakness. He states that this began about 8:00 this evening when he was trying to tie his shoe. He was unable to control his fingers or "make him work right." They came straight to the ED for evaluation. On arrival here it seems that his symptoms were improving.  Symptoms have largely resolved   Clinical Impression   Pt. Is an 82 y.o. male who was admitted with right hand weakness. Pt. Presents with right sided weakness, impaired coordination, impaired right hand function, decreased strength, apraxia, and requires cues for RUE initiation which hinders her ability to complete ADL tasks. Pt. could benefit from skilled OT services for ADL training, A/E training, UE ther. Ex, neuro muscular re-ed, and pt. Ed. to improve engagement of RUE and hand in functional ADL/IADL tasks.   Follow Up Recommendations  Outpatient OT    Equipment Recommendations       Recommendations for Other Services PT consult     Precautions / Restrictions Precautions Precautions: None Restrictions Weight Bearing Restrictions: No      Mobility Bed Mobility                  Transfers                      Balance Overall balance assessment: Independent                                          ADL Overall ADL's : Needs assistance/impaired Eating/Feeding: Set up;Moderate assistance (difficulty holding and handling utensils)   Grooming: Moderate assistance   Upper Body Bathing:  (Difficulty using his dominant right hand to handle and open ADL items, bottles  )           Lower Body Dressing: Maximal assistance               Functional mobility during ADLs: Minimal assistance       Vision     Perception     Praxis      Pertinent Vitals/Pain  Pain Assessment: 0-10     Hand Dominance Right   Extremity/Trunk Assessment Upper Extremity Assessment Upper Extremity Assessment: RUE deficits/detail RUE Deficits / Details: Right shoulder flexion 3-/5 for flexion and abduction, elbow flexion/extension 4+/5, forearm supination/pronation 4/5, wrist extension 4-/5. Grip strength: Right 25#, Left 73#. RUE Sensation:  (Intact sensation, and proprioceptive awareness.) RUE Coordination: decreased fine motor;decreased gross motor           Communication Communication Communication: No difficulties   Cognition Arousal/Alertness: Awake/alert Behavior During Therapy: WFL for tasks assessed/performed Overall Cognitive Status: Within Functional Limits for tasks assessed                     General Comments   Pt. ed re: RUE status and functioning.    Exercises       Shoulder Instructions      Home Living Family/patient expects to be discharged to:: Private residence Living Arrangements: Spouse/significant other Available Help at Discharge: Family Type of Home: House Home Access: Level entry     Home Layout: Two level     Bathroom Shower/Tub: Walk-in Nutritional therapist: Standard  Home Equipment: Shower seat - built in          Prior Functioning/Environment Level of Independence: Independent             OT Diagnosis: Generalized weakness;Apraxia   OT Problem List: Decreased strength;Pain;Decreased knowledge of use of DME or AE;Impaired UE functional use;Decreased coordination;Decreased activity tolerance   OT Treatment/Interventions: Self-care/ADL training;Therapeutic activities;Therapeutic exercise;Patient/family education;DME and/or AE instruction    OT Goals(Current goals can be found in the care plan section) Acute Rehab OT Goals Patient Stated Goal: To return home. OT Goal Formulation: With patient Potential to Achieve Goals: Good  OT Frequency: Min 1X/week   Barriers to D/C:             Co-evaluation              End of Session    Activity Tolerance: Patient tolerated treatment well Patient left: with chair alarm set;in chair;with call bell/phone within reach;with family/visitor present   Time: 1128-1206 OT Time Calculation (min): 38 min Charges:  OT General Charges $OT Visit: 1 Procedure OT Evaluation $OT Eval Moderate Complexity: 1 Procedure OT Treatments $Self Care/Home Management : 8-22 mins G-Codes:    Harrel Carina, MS, OTR/L Harrel Carina 12/22/2015, 12:25 PM

## 2015-12-22 NOTE — Progress Notes (Signed)
[place] [facility] [place] NAME: Fred Jones    MR#:  [id number]  DATE OF BIRTH:  [birth year]  SUBJECTIVE:  CHIEF COMPLAINT:   Chief Complaint  Patient presents with  . Code Stroke   Still has weakness right hand. No SOB/CP. Afebrile  REVIEW OF SYSTEMS:    Review of Systems  Constitutional: Negative for fever and chills.  HENT: Negative for sore throat.   Eyes: Negative for blurred vision, double vision and pain.  Respiratory: Negative for cough, hemoptysis, shortness of breath and wheezing.   Cardiovascular: Negative for chest pain, palpitations, orthopnea and leg swelling.  Gastrointestinal: Negative for heartburn, nausea, vomiting, abdominal pain, diarrhea and constipation.  Genitourinary: Negative for dysuria and hematuria.  Musculoskeletal: Negative for back pain and joint pain.  Skin: Negative for rash.  Neurological: Positive for focal weakness. Negative for sensory change, speech change and headaches.  Endo/Heme/Allergies: Does not bruise/bleed easily.  Psychiatric/Behavioral: Negative for depression. The patient is not nervous/anxious.    DRUG ALLERGIES:  No Known Allergies  VITALS:  Blood pressure 149/67, pulse 53, temperature 97.4 F (36.3 C), temperature source Oral, resp. rate 18, height 5\' 7"  (1.702 m), weight 74.39 kg (164 lb), SpO2 98 %.  PHYSICAL EXAMINATION:   Physical Exam  GENERAL:  82 y.o.-year-old patient lying in the bed with no acute distress.  EYES: Pupils equal, round, reactive to light and accommodation. No scleral icterus. Extraocular muscles intact.  HEENT: Head atraumatic, normocephalic. Oropharynx and nasopharynx clear.  NECK:  Supple, no jugular venous distention. No thyroid enlargement, no tenderness.  LUNGS: Normal breath sounds bilaterally, no wheezing, rales, rhonchi. No use of accessory muscles of respiration.  CARDIOVASCULAR: S1, S2 normal. No murmurs, rubs, or gallops.  ABDOMEN: Soft,  nontender, nondistended. Bowel sounds present. No organomegaly or mass.  EXTREMITIES: No cyanosis, clubbing or edema b/l.    NEUROLOGIC: Cranial nerves II through XII are intact. No focal Motor or sensory deficits b/l.  Weak right grip. PSYCHIATRIC: The patient is alert and oriented x 3.  SKIN: No obvious rash, lesion, or ulcer.   LABORATORY PANEL:   CBC  Recent Labs Lab 12/20/15 2116  WBC 12.1*  HGB 13.7  HCT 40.2  PLT 262   ------------------------------------------------------------------------------------------------------------------ Chemistries   Recent Labs Lab 12/20/15 2116  NA 135  K 3.3*  CL 102  CO2 28  GLUCOSE 118*  BUN 22*  CREATININE 1.33*  CALCIUM 10.1  AST 26  ALT 10*  ALKPHOS 66  BILITOT 0.5   ------------------------------------------------------------------------------------------------------------------  Cardiac Enzymes No results for input(s): TROPONINI in the last 168 hours. ------------------------------------------------------------------------------------------------------------------  RADIOLOGY:  Ct Head Wo Contrast  12/20/2015  CLINICAL DATA:  Code stroke. Acute onset of right arm weakness and numbness. Initial encounter. EXAM: CT HEAD WITHOUT CONTRAST TECHNIQUE: Contiguous axial images were obtained from the base of the skull through the vertex without intravenous contrast. COMPARISON:  CT of the head performed 06/13/2014, and MRI of the brain performed 06/14/2014 FINDINGS: There is no evidence of acute infarction, mass lesion, or intra- or extra-axial hemorrhage on CT. Prominence of the ventricles and sulci reflects moderate cortical volume loss. Mild cerebellar atrophy is noted. The brainstem and fourth ventricle are within normal limits. The basal ganglia are unremarkable in appearance. The cerebral hemispheres demonstrate grossly normal gray-white differentiation. No mass effect or midline shift is seen. There is no evidence of fracture;  visualized osseous structures are unremarkable in appearance. The orbits are within normal limits. The paranasal  sinuses and mastoid air cells are well-aerated. No significant soft tissue abnormalities are seen. IMPRESSION: 1. No acute intracranial pathology seen on CT. 2. Moderate cortical volume loss noted. These results were called by telephone at the time of interpretation on 12/20/2015 at 9:10 pm to Dr. Charlotte Crumb, who verbally acknowledged these results. Electronically Signed   By: Garald Balding M.D.   On: 12/20/2015 21:10   Mr Brain Wo Contrast  12/21/2015  CLINICAL DATA:  These results will be called to the ordering clinician or representative by the Radiologist Assistant, and communication documented in the PACS or zVision Dashboard. EXAM: MRI HEAD WITHOUT CONTRAST MRA HEAD WITHOUT CONTRAST TECHNIQUE: Multiplanar, multiecho pulse sequences of the brain and surrounding structures were obtained without intravenous contrast. Angiographic images of the head were obtained using MRA technique without contrast. COMPARISON:  CT head without contrast 12/20/2015. Carotid Doppler ultrasound 12/21/2015. MRI brain 06/14/2014. FINDINGS: MRI HEAD FINDINGS The diffusion-weighted images demonstrate an acute nonhemorrhagic 10 mm infarct in the left precentral gyrus corresponding to the hand a portion of the homunculus. An additional smaller acute infarct is present more superiorly within the left precentral gyrus. There are 2 areas within the left parietal lobe. The larger is in the white matter measuring 7.5 mm. At least 3 focal areas of acute nonhemorrhagic infarct are present within the left occipital lobe. A punctate area of acute nonhemorrhagic infarction is present in the left middle frontal gyrus on image 44 of series 106. There is also an acute nonhemorrhagic infarct of the left caudate head. No significant acute infarcts are present on the right. T2 changes are associated with the areas of acute infarction.  Moderate generalized atrophy is present. The ventricles are proportionate to the degree of atrophy. No significant extra-axial fluid collection is present. Flow is present in the major intracranial arteries. Bilateral lens replacements are noted. The globes and orbits are intact. The internal auditory canals are within normal limits bilaterally. A polyp or mucous retention cyst is present in the right maxillary sinus anteriorly. The paranasal sinuses are otherwise clear. There is some fluid in the left mastoid air cells. No obstructing nasopharyngeal lesion is evident. MRA HEAD FINDINGS Mild signal loss in the anterior right internal carotid artery is likely artifactual. The internal carotid arteries are otherwise within normal limits through the ICA terminus bilaterally. The A1 segment is dominant on the right. M1 segments are within normal limits. MCA branch vessels are normal. There is moderate attenuation of MCA branch vessels bilaterally. There is asymmetric attenuation of the right anterior cerebral artery compared to the left. A proximal stenosis is present. The right vertebral artery is the dominant vessel. The left PICA origin is below the field of view. The right PICA origin is visualized and normal. The basilar artery is normal. A moderate stenosis is present in the proximal left P1 segment. There is moderate attenuation of PCA branch vessels bilaterally. IMPRESSION: 1. Acute nonhemorrhagic infarct in the left primary motor cortex corresponding to the right hand. 2. Second acute nonhemorrhagic infarct in the left primary motor cortex more superiorly. 3. Additional acute nonhemorrhagic infarcts involving the anterior left frontal lobe, left caudate head, left parietal lobe, in the left occipital lobe. 4. Moderate stenosis in the proximal left P1 segment. 5. No other focal proximal stenosis to account for the left-sided infarcts. 6. Moderate diffuse small vessel disease. 7. Moderate generalized atrophy.  These results will be called to the ordering clinician or representative by the Radiologist Assistant, and communication documented in  the PACS or zVision Dashboard. Electronically Signed   By: San Morelle M.D.   On: 12/21/2015 15:27   US Carotid Bilateral  12/21/2015  CLINICAL DATA:  Acute stroke symptoms versus TIA. EXAM: BILATERAL CAROTID DUPLEX ULTRASOUND TECHNIQUE: Pearline Cables scale imaging, color Doppler and duplex ultrasound were performed of bilateral carotid and vertebral arteries in the neck. COMPARISON:  12/20/2015 CT FINDINGS: Criteria: Quantification of carotid stenosis is based on velocity parameters that correlate the residual internal carotid diameter with NASCET-based stenosis levels, using the diameter of the distal internal carotid lumen as the denominator for stenosis measurement. The following velocity measurements were obtained: RIGHT ICA:  84/18 cm/sec CCA:  [id number] cm/sec SYSTOLIC ICA/CCA RATIO:  0.8 DIASTOLIC ICA/CCA RATIO:  1.1 ECA:  214 cm/sec LEFT ICA:  81/17 cm/sec CCA:  [id number] cm/sec SYSTOLIC ICA/CCA RATIO:  0.9 DIASTOLIC ICA/CCA RATIO:  1.0 ECA:  316 cm/sec RIGHT CAROTID ARTERY: Minor echogenic shadowing plaque formation. No hemodynamically significant right ICA stenosis, velocity elevation, or turbulent flow. Degree of narrowing less than 50%. Incidental note of a moderate ECA origin stenosis. RIGHT VERTEBRAL ARTERY:  Antegrade LEFT CAROTID ARTERY: Similar scattered minor echogenic plaque formation. No hemodynamically significant left ICA stenosis, velocity elevation, or turbulent flow. Moderate left ECA stenosis also evident. LEFT VERTEBRAL ARTERY:  Antegrade IMPRESSION: Mild bilateral carotid atherosclerosis. No hemodynamically significant ICA stenosis. Degree of narrowing less than 50% bilaterally. Moderate ECA origin stenosis bilaterally. Patent antegrade vertebral flow bilaterally. Electronically Signed   By: Jerilynn Mages.  Shick M.D.   On: 12/21/2015 08:53   Mr Jodene Nam Head/brain Wo  Cm  12/21/2015  CLINICAL DATA:  These results will be called to the ordering clinician or representative by the Radiologist Assistant, and communication documented in the PACS or zVision Dashboard. EXAM: MRI HEAD WITHOUT CONTRAST MRA HEAD WITHOUT CONTRAST TECHNIQUE: Multiplanar, multiecho pulse sequences of the brain and surrounding structures were obtained without intravenous contrast. Angiographic images of the head were obtained using MRA technique without contrast. COMPARISON:  CT head without contrast 12/20/2015. Carotid Doppler ultrasound 12/21/2015. MRI brain 06/14/2014. FINDINGS: MRI HEAD FINDINGS The diffusion-weighted images demonstrate an acute nonhemorrhagic 10 mm infarct in the left precentral gyrus corresponding to the hand a portion of the homunculus. An additional smaller acute infarct is present more superiorly within the left precentral gyrus. There are 2 areas within the left parietal lobe. The larger is in the white matter measuring 7.5 mm. At least 3 focal areas of acute nonhemorrhagic infarct are present within the left occipital lobe. A punctate area of acute nonhemorrhagic infarction is present in the left middle frontal gyrus on image 44 of series 106. There is also an acute nonhemorrhagic infarct of the left caudate head. No significant acute infarcts are present on the right. T2 changes are associated with the areas of acute infarction. Moderate generalized atrophy is present. The ventricles are proportionate to the degree of atrophy. No significant extra-axial fluid collection is present. Flow is present in the major intracranial arteries. Bilateral lens replacements are noted. The globes and orbits are intact. The internal auditory canals are within normal limits bilaterally. A polyp or mucous retention cyst is present in the right maxillary sinus anteriorly. The paranasal sinuses are otherwise clear. There is some fluid in the left mastoid air cells. No obstructing nasopharyngeal lesion  is evident. MRA HEAD FINDINGS Mild signal loss in the anterior right internal carotid artery is likely artifactual. The internal carotid arteries are otherwise within normal limits through the ICA terminus bilaterally. The A1 segment  is dominant on the right. M1 segments are within normal limits. MCA branch vessels are normal. There is moderate attenuation of MCA branch vessels bilaterally. There is asymmetric attenuation of the right anterior cerebral artery compared to the left. A proximal stenosis is present. The right vertebral artery is the dominant vessel. The left PICA origin is below the field of view. The right PICA origin is visualized and normal. The basilar artery is normal. A moderate stenosis is present in the proximal left P1 segment. There is moderate attenuation of PCA branch vessels bilaterally. IMPRESSION: 1. Acute nonhemorrhagic infarct in the left primary motor cortex corresponding to the right hand. 2. Second acute nonhemorrhagic infarct in the left primary motor cortex more superiorly. 3. Additional acute nonhemorrhagic infarcts involving the anterior left frontal lobe, left caudate head, left parietal lobe, in the left occipital lobe. 4. Moderate stenosis in the proximal left P1 segment. 5. No other focal proximal stenosis to account for the left-sided infarcts. 6. Moderate diffuse small vessel disease. 7. Moderate generalized atrophy. These results will be called to the ordering clinician or representative by the Radiologist Assistant, and communication documented in the PACS or zVision Dashboard. Electronically Signed   By: San Morelle M.D.   On: 12/21/2015 15:27   ASSESSMENT AND PLAN:   * Acute left CVA. Multiple areas. Unlikely cardiembolic as they are unilateral Discussed with Dr. Irish Elders. ECHO and carotids unremarkable. Plavix. Start Lipitor. PT. Holter at d/c.  * HTN Losartan held  DVT prophylaxis  All the records are reviewed and case discussed with Care  Management/Social Workerr. Management plans discussed with the patient, family and they are in agreement.  CODE STATUS: FULL CODE  DVT Prophylaxis: SCDs  TOTAL TIME TAKING CARE OF THIS PATIENT: 35 minutes.   POSSIBLE D/C IN 1-2 DAYS, DEPENDING ON CLINICAL CONDITION.  Hillary Bow R M.D on 12/22/2015 at 7:55 AM  Between 7am to 6pm - Pager - [phone]  After 6pm go to [email or web address] - password [email or web address] [facility]  Office  [phone]  CC: Primary care physician; Rusty Aus, MD  Note: This dictation was prepared with Dragon dictation along with smaller phrase technology. Any transcriptional errors that result from this process are unintentional.

## 2015-12-22 NOTE — Care Management (Signed)
Discharge to home today per Dr. Darvin Neighbours.  Discussed home health agencies. [place], Floydene Flock, [place] representative updated. Will need nursing, physical therapy occupational therapy, and rolling walker . Wife will transport. Shelbie Ammons RN MSN CCM Care Management [phone]

## 2015-12-22 NOTE — Progress Notes (Signed)
Subjective: Patient stable with no new complaints.    Objective: Current vital signs: BP 149/67 mmHg  Pulse 53  Temp(Src) 97.4 F (36.3 C) (Oral)  Resp 18  Ht 5\' 7"  (1.702 m)  Wt 74.39 kg (164 lb)  BMI 25.68 kg/m2  SpO2 98% Vital signs in last 24 hours: Temp:  [97.4 F (36.3 C)-98.2 F (36.8 C)] 97.4 F (36.3 C) (06/05 0538) Pulse Rate:  [48-57] 53 (06/05 0544) Resp:  [16-18] 18 (06/05 0112) BP: (110-164)/(62-86) 149/67 mmHg (06/05 0544) SpO2:  [97 %-100 %] 98 % (06/05 0538)  Intake/Output from previous day: 06/04 0701 - 06/05 0700 In: 720 [P.O.:720] Out: 200 [Urine:200] Intake/Output this shift: Total I/O In: 240 [P.O.:240] Out: -  Nutritional status: Diet Heart Room service appropriate?: Yes; Fluid consistency:: Thin  Neurologic Exam: Mental Status: Alert, oriented, thought content appropriate. Speech fluent without evidence of aphasia. Able to follow 3 step commands without difficulty. Cranial Nerves: II: Discs flat bilaterally; Visual fields grossly normal, pupils equal, round, reactive to light and accommodation III,IV, VI: ptosis not present, extra-ocular motions intact bilaterally V,VII: smile symmetric, facial light touch sensation normal bilaterally VIII: hearing normal bilaterally IX,X: gag reflex present XI: bilateral shoulder shrug XII: midline tongue extension Motor: Right :Upper extremity 5-/5Left: Upper extremity 5/5 Lower extremity 5/5Lower extremity 5/5 Tone and bulk:normal tone throughout; no atrophy noted Sensory: Pinprick and light touch intact throughout, bilaterally  Lab Results: Basic Metabolic Panel:  Recent Labs Lab 12/20/15 2116  NA 135  K 3.3*  CL 102  CO2 28  GLUCOSE 118*  BUN 22*  CREATININE 1.33*  CALCIUM 10.1    Liver Function Tests:  Recent Labs Lab 12/20/15 2116  AST 26  ALT 10*  ALKPHOS 66   BILITOT 0.5  PROT 6.5  ALBUMIN 4.0   No results for input(s): LIPASE, AMYLASE in the last 168 hours. No results for input(s): AMMONIA in the last 168 hours.  CBC:  Recent Labs Lab 12/20/15 2116  WBC 12.1*  NEUTROABS 8.1*  HGB 13.7  HCT 40.2  MCV 89.2  PLT 262    Cardiac Enzymes: No results for input(s): CKTOTAL, CKMB, CKMBINDEX, TROPONINI in the last 168 hours.  Lipid Panel:  Recent Labs Lab 12/21/15 0441  CHOL 179  TRIG 108  HDL 28*  CHOLHDL 6.4  VLDL 22  LDLCALC 129*    CBG:  Recent Labs Lab 12/20/15 2115  GLUCAP 115*    Microbiology: No results found for this or any previous visit.  Coagulation Studies:  Recent Labs  12/20/15 2116  LABPROT 15.0  INR 1.16    Imaging: Ct Head Wo Contrast  12/20/2015  CLINICAL DATA:  Code stroke. Acute onset of right arm weakness and numbness. Initial encounter. EXAM: CT HEAD WITHOUT CONTRAST TECHNIQUE: Contiguous axial images were obtained from the base of the skull through the vertex without intravenous contrast. COMPARISON:  CT of the head performed 06/13/2014, and MRI of the brain performed 06/14/2014 FINDINGS: There is no evidence of acute infarction, mass lesion, or intra- or extra-axial hemorrhage on CT. Prominence of the ventricles and sulci reflects moderate cortical volume loss. Mild cerebellar atrophy is noted. The brainstem and fourth ventricle are within normal limits. The basal ganglia are unremarkable in appearance. The cerebral hemispheres demonstrate grossly normal gray-white differentiation. No mass effect or midline shift is seen. There is no evidence of fracture; visualized osseous structures are unremarkable in appearance. The orbits are within normal limits. The paranasal sinuses and mastoid air cells are well-aerated.  No significant soft tissue abnormalities are seen. IMPRESSION: 1. No acute intracranial pathology seen on CT. 2. Moderate cortical volume loss noted. These results were called by  telephone at the time of interpretation on 12/20/2015 at 9:10 pm to Dr. Charlotte Crumb, who verbally acknowledged these results. Electronically Signed   By: Garald Balding M.D.   On: 12/20/2015 21:10   Mr Brain Wo Contrast  12/21/2015  CLINICAL DATA:  These results will be called to the ordering clinician or representative by the Radiologist Assistant, and communication documented in the PACS or zVision Dashboard. EXAM: MRI HEAD WITHOUT CONTRAST MRA HEAD WITHOUT CONTRAST TECHNIQUE: Multiplanar, multiecho pulse sequences of the brain and surrounding structures were obtained without intravenous contrast. Angiographic images of the head were obtained using MRA technique without contrast. COMPARISON:  CT head without contrast 12/20/2015. Carotid Doppler ultrasound 12/21/2015. MRI brain 06/14/2014. FINDINGS: MRI HEAD FINDINGS The diffusion-weighted images demonstrate an acute nonhemorrhagic 10 mm infarct in the left precentral gyrus corresponding to the hand a portion of the homunculus. An additional smaller acute infarct is present more superiorly within the left precentral gyrus. There are 2 areas within the left parietal lobe. The larger is in the white matter measuring 7.5 mm. At least 3 focal areas of acute nonhemorrhagic infarct are present within the left occipital lobe. A punctate area of acute nonhemorrhagic infarction is present in the left middle frontal gyrus on image 44 of series 106. There is also an acute nonhemorrhagic infarct of the left caudate head. No significant acute infarcts are present on the right. T2 changes are associated with the areas of acute infarction. Moderate generalized atrophy is present. The ventricles are proportionate to the degree of atrophy. No significant extra-axial fluid collection is present. Flow is present in the major intracranial arteries. Bilateral lens replacements are noted. The globes and orbits are intact. The internal auditory canals are within normal limits  bilaterally. A polyp or mucous retention cyst is present in the right maxillary sinus anteriorly. The paranasal sinuses are otherwise clear. There is some fluid in the left mastoid air cells. No obstructing nasopharyngeal lesion is evident. MRA HEAD FINDINGS Mild signal loss in the anterior right internal carotid artery is likely artifactual. The internal carotid arteries are otherwise within normal limits through the ICA terminus bilaterally. The A1 segment is dominant on the right. M1 segments are within normal limits. MCA branch vessels are normal. There is moderate attenuation of MCA branch vessels bilaterally. There is asymmetric attenuation of the right anterior cerebral artery compared to the left. A proximal stenosis is present. The right vertebral artery is the dominant vessel. The left PICA origin is below the field of view. The right PICA origin is visualized and normal. The basilar artery is normal. A moderate stenosis is present in the proximal left P1 segment. There is moderate attenuation of PCA branch vessels bilaterally. IMPRESSION: 1. Acute nonhemorrhagic infarct in the left primary motor cortex corresponding to the right hand. 2. Second acute nonhemorrhagic infarct in the left primary motor cortex more superiorly. 3. Additional acute nonhemorrhagic infarcts involving the anterior left frontal lobe, left caudate head, left parietal lobe, in the left occipital lobe. 4. Moderate stenosis in the proximal left P1 segment. 5. No other focal proximal stenosis to account for the left-sided infarcts. 6. Moderate diffuse small vessel disease. 7. Moderate generalized atrophy. These results will be called to the ordering clinician or representative by the Radiologist Assistant, and communication documented in the PACS or zVision Dashboard. Electronically Signed  By: San Morelle M.D.   On: 12/21/2015 15:27   US Carotid Bilateral  12/21/2015  CLINICAL DATA:  Acute stroke symptoms versus TIA. EXAM:  BILATERAL CAROTID DUPLEX ULTRASOUND TECHNIQUE: Pearline Cables scale imaging, color Doppler and duplex ultrasound were performed of bilateral carotid and vertebral arteries in the neck. COMPARISON:  12/20/2015 CT FINDINGS: Criteria: Quantification of carotid stenosis is based on velocity parameters that correlate the residual internal carotid diameter with NASCET-based stenosis levels, using the diameter of the distal internal carotid lumen as the denominator for stenosis measurement. The following velocity measurements were obtained: RIGHT ICA:  84/18 cm/sec CCA:  [id number] cm/sec SYSTOLIC ICA/CCA RATIO:  0.8 DIASTOLIC ICA/CCA RATIO:  1.1 ECA:  214 cm/sec LEFT ICA:  81/17 cm/sec CCA:  [id number] cm/sec SYSTOLIC ICA/CCA RATIO:  0.9 DIASTOLIC ICA/CCA RATIO:  1.0 ECA:  316 cm/sec RIGHT CAROTID ARTERY: Minor echogenic shadowing plaque formation. No hemodynamically significant right ICA stenosis, velocity elevation, or turbulent flow. Degree of narrowing less than 50%. Incidental note of a moderate ECA origin stenosis. RIGHT VERTEBRAL ARTERY:  Antegrade LEFT CAROTID ARTERY: Similar scattered minor echogenic plaque formation. No hemodynamically significant left ICA stenosis, velocity elevation, or turbulent flow. Moderate left ECA stenosis also evident. LEFT VERTEBRAL ARTERY:  Antegrade IMPRESSION: Mild bilateral carotid atherosclerosis. No hemodynamically significant ICA stenosis. Degree of narrowing less than 50% bilaterally. Moderate ECA origin stenosis bilaterally. Patent antegrade vertebral flow bilaterally. Electronically Signed   By: Jerilynn Mages.  Shick M.D.   On: 12/21/2015 08:53   Mr Jodene Nam Head/brain Wo Cm  12/21/2015  CLINICAL DATA:  These results will be called to the ordering clinician or representative by the Radiologist Assistant, and communication documented in the PACS or zVision Dashboard. EXAM: MRI HEAD WITHOUT CONTRAST MRA HEAD WITHOUT CONTRAST TECHNIQUE: Multiplanar, multiecho pulse sequences of the brain and surrounding  structures were obtained without intravenous contrast. Angiographic images of the head were obtained using MRA technique without contrast. COMPARISON:  CT head without contrast 12/20/2015. Carotid Doppler ultrasound 12/21/2015. MRI brain 06/14/2014. FINDINGS: MRI HEAD FINDINGS The diffusion-weighted images demonstrate an acute nonhemorrhagic 10 mm infarct in the left precentral gyrus corresponding to the hand a portion of the homunculus. An additional smaller acute infarct is present more superiorly within the left precentral gyrus. There are 2 areas within the left parietal lobe. The larger is in the white matter measuring 7.5 mm. At least 3 focal areas of acute nonhemorrhagic infarct are present within the left occipital lobe. A punctate area of acute nonhemorrhagic infarction is present in the left middle frontal gyrus on image 44 of series 106. There is also an acute nonhemorrhagic infarct of the left caudate head. No significant acute infarcts are present on the right. T2 changes are associated with the areas of acute infarction. Moderate generalized atrophy is present. The ventricles are proportionate to the degree of atrophy. No significant extra-axial fluid collection is present. Flow is present in the major intracranial arteries. Bilateral lens replacements are noted. The globes and orbits are intact. The internal auditory canals are within normal limits bilaterally. A polyp or mucous retention cyst is present in the right maxillary sinus anteriorly. The paranasal sinuses are otherwise clear. There is some fluid in the left mastoid air cells. No obstructing nasopharyngeal lesion is evident. MRA HEAD FINDINGS Mild signal loss in the anterior right internal carotid artery is likely artifactual. The internal carotid arteries are otherwise within normal limits through the ICA terminus bilaterally. The A1 segment is dominant on the right. M1 segments are within  normal limits. MCA branch vessels are normal. There  is moderate attenuation of MCA branch vessels bilaterally. There is asymmetric attenuation of the right anterior cerebral artery compared to the left. A proximal stenosis is present. The right vertebral artery is the dominant vessel. The left PICA origin is below the field of view. The right PICA origin is visualized and normal. The basilar artery is normal. A moderate stenosis is present in the proximal left P1 segment. There is moderate attenuation of PCA branch vessels bilaterally. IMPRESSION: 1. Acute nonhemorrhagic infarct in the left primary motor cortex corresponding to the right hand. 2. Second acute nonhemorrhagic infarct in the left primary motor cortex more superiorly. 3. Additional acute nonhemorrhagic infarcts involving the anterior left frontal lobe, left caudate head, left parietal lobe, in the left occipital lobe. 4. Moderate stenosis in the proximal left P1 segment. 5. No other focal proximal stenosis to account for the left-sided infarcts. 6. Moderate diffuse small vessel disease. 7. Moderate generalized atrophy. These results will be called to the ordering clinician or representative by the Radiologist Assistant, and communication documented in the PACS or zVision Dashboard. Electronically Signed   By: San Morelle M.D.   On: 12/21/2015 15:27    Medications:  I have reviewed the patient's current medications. Scheduled: . atorvastatin  40 mg Oral q1800  . carbidopa-levodopa  1.5 tablet Oral TID  . clopidogrel  75 mg Oral Daily  . donepezil  5 mg Oral QHS  . DULoxetine  60 mg Oral Daily  . enoxaparin (LOVENOX) injection  40 mg Subcutaneous Q24H    Assessment/Plan: Patient stable.  MRI of the brain personally reviewed and shows small acute infarcts in the left motor cortex, frontal lobe, caudate head, parietal lobe and occipital lobe.  Carotid dopplers show no evidence of hemodynamically significant stenosis.  Echocardiogram shows no cardiac source of emboli with an EF of  50-55%.  A1c 6.0, LDL 129.  Recommendations: 1.  Agree with statin.  Target LDL <70. 2.  Agree with change from ASA to Plavix 3.  Will benefit from prolonged cardiac monitoring as an outpatient.     LOS: 2 days   Alexis Goodell, MD Neurology [phone] 12/22/2015  11:00 AM
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
## 2015-12-22 NOTE — Care Management Important Message (Signed)
Important Message  Patient Details  Name: Fred LOCKAMY Sr. MRN: [id number] Date of Birth: 1930/08/14   Medicare Important Message Given:  Yes    Shelbie Ammons, RN 12/22/2015, 8:51 AM

## 2015-12-22 NOTE — Progress Notes (Signed)
Pt d/ced home.  Wked with OT for R hand fine motor deficit.  Has moderately good strength, but he's avoiding using this hand. NIHSS = 0.  Pt has some slowness in responding and is not very steady on his feet.  Strongly discouraged patient from driving.  He tried to bargain if [place] could drive [place] he'd not drive out of town.  I reminded him he's had a stroke which is very serious - to get the home PT and talk to PCP about this. He will be going home with home health. We also get a walker for home use.  Wife appears to be a little overwhelmed.  Respiratory put holster monitor on pt - will be using for 48 hrs and reviewed where to return it.  Reviewed d/c instructions w/pt and he'll be going home with wife.

## 2015-12-24 DIAGNOSIS — I10 Essential (primary) hypertension: Secondary | ICD-10-CM | POA: Diagnosis not present

## 2015-12-24 DIAGNOSIS — Z7902 Long term (current) use of antithrombotics/antiplatelets: Secondary | ICD-10-CM | POA: Diagnosis not present

## 2015-12-24 DIAGNOSIS — K219 Gastro-esophageal reflux disease without esophagitis: Secondary | ICD-10-CM | POA: Diagnosis not present

## 2015-12-24 DIAGNOSIS — R2689 Other abnormalities of gait and mobility: Secondary | ICD-10-CM | POA: Diagnosis not present

## 2015-12-24 DIAGNOSIS — G4733 Obstructive sleep apnea (adult) (pediatric): Secondary | ICD-10-CM | POA: Diagnosis not present

## 2015-12-24 DIAGNOSIS — Z8546 Personal history of malignant neoplasm of prostate: Secondary | ICD-10-CM | POA: Diagnosis not present

## 2015-12-24 DIAGNOSIS — F329 Major depressive disorder, single episode, unspecified: Secondary | ICD-10-CM | POA: Diagnosis not present

## 2015-12-24 DIAGNOSIS — Z85820 Personal history of malignant melanoma of skin: Secondary | ICD-10-CM | POA: Diagnosis not present

## 2015-12-24 DIAGNOSIS — I69331 Monoplegia of upper limb following cerebral infarction affecting right dominant side: Secondary | ICD-10-CM | POA: Diagnosis not present

## 2015-12-24 NOTE — Discharge Summary (Signed)
[facility] [place] NAME: Fred Jones    MR#:  [id number]  DATE OF BIRTH:  May 21, 1931  DATE OF ADMISSION:  12/20/2015 ADMITTING PHYSICIAN: Lance Coon, MD  DATE OF DISCHARGE: 12/22/2015  4:45 PM  PRIMARY CARE PHYSICIAN: Rusty Aus, MD   ADMISSION DIAGNOSIS:  Transient cerebral ischemia, unspecified transient cerebral ischemia type [G45.9]  DISCHARGE DIAGNOSIS:  Principal Problem:   Stroke (cerebrum) ([place]) Active Problems:   HTN (hypertension)   GERD (gastroesophageal reflux disease)   Depression   SECONDARY DIAGNOSIS:   Past Medical History  Diagnosis Date  . Hypertension     Echo s/p fall 11/15  . Arthritis     fingers, neck  . GERD (gastroesophageal reflux disease)     rare  . Depression   . Cancer of prostate ([place])   . Cancer of skin      ADMITTING HISTORY  Fred Jones is a 82 y.o. male who presents with An episode of right hand weakness. He states that this began about 8:00 this evening when he was trying to tie his shoe. He was unable to control his fingers or "make him work right." They came straight to the ED for evaluation. On arrival here it seems that his symptoms were improving, and by the time this writer interviewed him symptoms had resolved completely. No prior history of stroke or TIA. Initial workup in the ED was negative. Telemetry neurologist recommended admission and stroke workup.  HOSPITAL COURSE:   * Acute left CVA. Multiple areas. Unlikely cardiembolic as they are unilateral Discussed with Dr. Irish Elders. ECHO and carotids unremarkable. Plavix. Started Lipitor. PT. Holter ordered at d/c.  * HTN Losartan  Home health with PT/OT set up.  DVT prophylaxis  CONSULTS OBTAINED:  Treatment Team:  Leotis Pain, MD  DRUG ALLERGIES:  No Known Allergies  DISCHARGE MEDICATIONS:   Discharge Medication List as of 12/22/2015  3:53 PM    START taking these medications   Details   atorvastatin (LIPITOR) 40 MG tablet Take 1 tablet (40 mg total) by mouth daily at 6 PM., Starting 12/21/2015, Until Discontinued, Normal    clopidogrel (PLAVIX) 75 MG tablet Take 1 tablet (75 mg total) by mouth daily., Starting 12/22/2015, Until Discontinued, Normal      CONTINUE these medications which have NOT CHANGED   Details  carbidopa-levodopa (SINEMET IR) 25-100 MG tablet Take 1.5 tablets by mouth 3 (three) times daily. , Starting 12/11/2015, Until Discontinued, Historical Med    donepezil (ARICEPT) 5 MG tablet Take 5 mg by mouth at bedtime., Starting 12/16/2015, Until Discontinued, Historical Med    DULoxetine (CYMBALTA) 60 MG capsule Take 60 mg by mouth daily., Starting 12/11/2015, Until Discontinued, Historical Med    losartan (COZAAR) 25 MG tablet Take 25 mg by mouth daily. , Until Discontinued, Historical Med      STOP taking these medications     aspirin 81 MG tablet         Today   VITAL SIGNS:  Blood pressure 159/72, pulse 53, temperature 98 F (36.7 C), temperature source Oral, resp. rate 18, height 5\' 7"  (1.702 m), weight 74.39 kg (164 lb), SpO2 98 %.  I/O:  No intake or output data in the 24 hours ending 12/24/15 1948  PHYSICAL EXAMINATION:  Physical Exam  GENERAL:  82 y.o.-year-old patient lying in the bed with no acute distress.  LUNGS: Normal breath sounds bilaterally, no wheezing, rales,rhonchi or crepitation. No use of accessory muscles  of respiration.  CARDIOVASCULAR: S1, S2 normal. No murmurs, rubs, or gallops.  ABDOMEN: Soft, non-tender, non-distended. Bowel sounds present. No organomegaly or mass.  NEUROLOGIC: Moves all 4 extremities. RUE weakness PSYCHIATRIC: The patient is alert and oriented x 3.  SKIN: No obvious rash, lesion, or ulcer.   DATA REVIEW:   CBC  Recent Labs Lab 12/20/15 2116  WBC 12.1*  HGB 13.7  HCT 40.2  PLT 262    Chemistries   Recent Labs Lab 12/20/15 2116  NA 135  K 3.3*  CL 102  CO2 28  GLUCOSE 118*  BUN 22*   CREATININE 1.33*  CALCIUM 10.1  AST 26  ALT 10*  ALKPHOS 66  BILITOT 0.5    Cardiac Enzymes No results for input(s): TROPONINI in the last 168 hours.  Microbiology Results  No results found for this or any previous visit.  RADIOLOGY:  No results found.  Follow up with PCP in 1 week.  Management plans discussed with the patient, family and they are in agreement.  CODE STATUS:  Code Status History    Date Active Date Inactive Code Status Order ID Comments User Context   12/20/2015 11:30 PM 12/22/2015  8:25 PM Full Code [id number]  Lance Coon, MD Inpatient      TOTAL TIME TAKING CARE OF THIS PATIENT ON DAY OF DISCHARGE: more than 30 minutes.   Hillary Bow R M.D on 12/24/2015 at 7:48 PM  Between 7am to 6pm - Pager - [phone]  After 6pm go to [email or web address] - password [email or web address] [facility]  Office  [phone]  CC: Primary care physician; Rusty Aus, MD  Note: This dictation was prepared with Dragon dictation along with smaller phrase technology. Any transcriptional errors that result from this process are unintentional.

## 2015-12-25 DIAGNOSIS — I63422 Cerebral infarction due to embolism of left anterior cerebral artery: Secondary | ICD-10-CM | POA: Diagnosis not present

## 2015-12-25 HISTORY — DX: Cerebral infarction due to embolism of left anterior cerebral artery: I63.422

## 2015-12-29 DIAGNOSIS — K219 Gastro-esophageal reflux disease without esophagitis: Secondary | ICD-10-CM | POA: Diagnosis not present

## 2015-12-29 DIAGNOSIS — Z8546 Personal history of malignant neoplasm of prostate: Secondary | ICD-10-CM | POA: Diagnosis not present

## 2015-12-29 DIAGNOSIS — I10 Essential (primary) hypertension: Secondary | ICD-10-CM | POA: Diagnosis not present

## 2015-12-29 DIAGNOSIS — Z7902 Long term (current) use of antithrombotics/antiplatelets: Secondary | ICD-10-CM | POA: Diagnosis not present

## 2015-12-29 DIAGNOSIS — I69331 Monoplegia of upper limb following cerebral infarction affecting right dominant side: Secondary | ICD-10-CM | POA: Diagnosis not present

## 2015-12-29 DIAGNOSIS — R2689 Other abnormalities of gait and mobility: Secondary | ICD-10-CM | POA: Diagnosis not present

## 2015-12-29 DIAGNOSIS — Z85820 Personal history of malignant melanoma of skin: Secondary | ICD-10-CM | POA: Diagnosis not present

## 2015-12-29 DIAGNOSIS — F329 Major depressive disorder, single episode, unspecified: Secondary | ICD-10-CM | POA: Diagnosis not present

## 2015-12-30 DIAGNOSIS — I10 Essential (primary) hypertension: Secondary | ICD-10-CM | POA: Diagnosis not present

## 2015-12-30 DIAGNOSIS — G20 Parkinson's disease: Secondary | ICD-10-CM | POA: Diagnosis not present

## 2015-12-30 DIAGNOSIS — E782 Mixed hyperlipidemia: Secondary | ICD-10-CM | POA: Diagnosis not present

## 2015-12-30 DIAGNOSIS — I63422 Cerebral infarction due to embolism of left anterior cerebral artery: Secondary | ICD-10-CM | POA: Diagnosis not present

## 2015-12-30 DIAGNOSIS — G4733 Obstructive sleep apnea (adult) (pediatric): Secondary | ICD-10-CM | POA: Diagnosis not present

## 2015-12-30 DIAGNOSIS — Z9989 Dependence on other enabling machines and devices: Secondary | ICD-10-CM | POA: Diagnosis not present

## 2015-12-31 ENCOUNTER — Ambulatory Visit
Admission: RE | Admit: 2015-12-31 | Discharge: 2015-12-31 | Disposition: A | Discharge status: Home / Self Care | Payer: PPO | Source: Ambulatory Visit | Attending: Internal Medicine | Admitting: Internal Medicine

## 2016-01-01 DIAGNOSIS — Z7902 Long term (current) use of antithrombotics/antiplatelets: Secondary | ICD-10-CM | POA: Diagnosis not present

## 2016-01-01 DIAGNOSIS — I69331 Monoplegia of upper limb following cerebral infarction affecting right dominant side: Secondary | ICD-10-CM | POA: Diagnosis not present

## 2016-01-01 DIAGNOSIS — Z8546 Personal history of malignant neoplasm of prostate: Secondary | ICD-10-CM | POA: Diagnosis not present

## 2016-01-01 DIAGNOSIS — Z85820 Personal history of malignant melanoma of skin: Secondary | ICD-10-CM | POA: Diagnosis not present

## 2016-01-01 DIAGNOSIS — R2689 Other abnormalities of gait and mobility: Secondary | ICD-10-CM | POA: Diagnosis not present

## 2016-01-01 DIAGNOSIS — K219 Gastro-esophageal reflux disease without esophagitis: Secondary | ICD-10-CM | POA: Diagnosis not present

## 2016-01-01 DIAGNOSIS — F329 Major depressive disorder, single episode, unspecified: Secondary | ICD-10-CM | POA: Diagnosis not present

## 2016-01-01 DIAGNOSIS — I10 Essential (primary) hypertension: Secondary | ICD-10-CM | POA: Diagnosis not present

## 2016-01-02 DIAGNOSIS — I10 Essential (primary) hypertension: Secondary | ICD-10-CM | POA: Diagnosis not present

## 2016-01-02 DIAGNOSIS — I638 Other cerebral infarction: Secondary | ICD-10-CM | POA: Diagnosis not present

## 2016-01-02 DIAGNOSIS — I69331 Monoplegia of upper limb following cerebral infarction affecting right dominant side: Secondary | ICD-10-CM | POA: Diagnosis not present

## 2016-01-02 DIAGNOSIS — F329 Major depressive disorder, single episode, unspecified: Secondary | ICD-10-CM | POA: Diagnosis not present

## 2016-01-02 DIAGNOSIS — R4189 Other symptoms and signs involving cognitive functions and awareness: Secondary | ICD-10-CM | POA: Diagnosis not present

## 2016-01-02 DIAGNOSIS — Z7902 Long term (current) use of antithrombotics/antiplatelets: Secondary | ICD-10-CM | POA: Diagnosis not present

## 2016-01-02 DIAGNOSIS — Z8546 Personal history of malignant neoplasm of prostate: Secondary | ICD-10-CM | POA: Diagnosis not present

## 2016-01-02 DIAGNOSIS — Z85820 Personal history of malignant melanoma of skin: Secondary | ICD-10-CM | POA: Diagnosis not present

## 2016-01-02 DIAGNOSIS — R2689 Other abnormalities of gait and mobility: Secondary | ICD-10-CM | POA: Diagnosis not present

## 2016-01-02 DIAGNOSIS — K219 Gastro-esophageal reflux disease without esophagitis: Secondary | ICD-10-CM | POA: Diagnosis not present

## 2016-01-02 DIAGNOSIS — G20 Parkinson's disease: Secondary | ICD-10-CM | POA: Diagnosis not present

## 2016-01-02 DIAGNOSIS — G4733 Obstructive sleep apnea (adult) (pediatric): Secondary | ICD-10-CM | POA: Diagnosis not present

## 2016-01-05 DIAGNOSIS — I69331 Monoplegia of upper limb following cerebral infarction affecting right dominant side: Secondary | ICD-10-CM | POA: Diagnosis not present

## 2016-01-05 DIAGNOSIS — Z7902 Long term (current) use of antithrombotics/antiplatelets: Secondary | ICD-10-CM | POA: Diagnosis not present

## 2016-01-05 DIAGNOSIS — Z85820 Personal history of malignant melanoma of skin: Secondary | ICD-10-CM | POA: Diagnosis not present

## 2016-01-05 DIAGNOSIS — Z8546 Personal history of malignant neoplasm of prostate: Secondary | ICD-10-CM | POA: Diagnosis not present

## 2016-01-05 DIAGNOSIS — I10 Essential (primary) hypertension: Secondary | ICD-10-CM | POA: Diagnosis not present

## 2016-01-05 DIAGNOSIS — K219 Gastro-esophageal reflux disease without esophagitis: Secondary | ICD-10-CM | POA: Diagnosis not present

## 2016-01-05 DIAGNOSIS — F329 Major depressive disorder, single episode, unspecified: Secondary | ICD-10-CM | POA: Diagnosis not present

## 2016-01-05 DIAGNOSIS — R2689 Other abnormalities of gait and mobility: Secondary | ICD-10-CM | POA: Diagnosis not present

## 2016-01-06 DIAGNOSIS — K219 Gastro-esophageal reflux disease without esophagitis: Secondary | ICD-10-CM | POA: Diagnosis not present

## 2016-01-06 DIAGNOSIS — I10 Essential (primary) hypertension: Secondary | ICD-10-CM | POA: Diagnosis not present

## 2016-01-06 DIAGNOSIS — F329 Major depressive disorder, single episode, unspecified: Secondary | ICD-10-CM | POA: Diagnosis not present

## 2016-01-06 DIAGNOSIS — Z85820 Personal history of malignant melanoma of skin: Secondary | ICD-10-CM | POA: Diagnosis not present

## 2016-01-06 DIAGNOSIS — R2689 Other abnormalities of gait and mobility: Secondary | ICD-10-CM | POA: Diagnosis not present

## 2016-01-06 DIAGNOSIS — I69331 Monoplegia of upper limb following cerebral infarction affecting right dominant side: Secondary | ICD-10-CM | POA: Diagnosis not present

## 2016-01-06 DIAGNOSIS — Z8546 Personal history of malignant neoplasm of prostate: Secondary | ICD-10-CM | POA: Diagnosis not present

## 2016-01-06 DIAGNOSIS — Z7902 Long term (current) use of antithrombotics/antiplatelets: Secondary | ICD-10-CM | POA: Diagnosis not present

## 2016-01-07 DIAGNOSIS — I10 Essential (primary) hypertension: Secondary | ICD-10-CM | POA: Diagnosis not present

## 2016-01-07 DIAGNOSIS — F329 Major depressive disorder, single episode, unspecified: Secondary | ICD-10-CM | POA: Diagnosis not present

## 2016-01-07 DIAGNOSIS — Z85820 Personal history of malignant melanoma of skin: Secondary | ICD-10-CM | POA: Diagnosis not present

## 2016-01-07 DIAGNOSIS — I69331 Monoplegia of upper limb following cerebral infarction affecting right dominant side: Secondary | ICD-10-CM | POA: Diagnosis not present

## 2016-01-07 DIAGNOSIS — Z8546 Personal history of malignant neoplasm of prostate: Secondary | ICD-10-CM | POA: Diagnosis not present

## 2016-01-07 DIAGNOSIS — R2689 Other abnormalities of gait and mobility: Secondary | ICD-10-CM | POA: Diagnosis not present

## 2016-01-07 DIAGNOSIS — Z7902 Long term (current) use of antithrombotics/antiplatelets: Secondary | ICD-10-CM | POA: Diagnosis not present

## 2016-01-07 DIAGNOSIS — K219 Gastro-esophageal reflux disease without esophagitis: Secondary | ICD-10-CM | POA: Diagnosis not present

## 2016-01-13 ENCOUNTER — Ambulatory Visit: Payer: PPO | Attending: Neurology | Admitting: Occupational Therapy

## 2016-01-13 DIAGNOSIS — M6281 Muscle weakness (generalized): Secondary | ICD-10-CM | POA: Insufficient documentation

## 2016-01-13 DIAGNOSIS — R278 Other lack of coordination: Secondary | ICD-10-CM | POA: Diagnosis not present

## 2016-01-13 NOTE — Therapy (Signed)
[place] MAIN [facility] SERVICES [address] [place], [place], [address] Phone: [phone]   Fax:  [phone]  Occupational Therapy Evaluation  Patient Details  Name: Fred Jones Sr. MRN: [id number] Date of Birth: [birth year] Referring Provider: Manuella Ghazi  Encounter Date: 01/13/2016      OT End of Session - 01/13/16 1650    Visit Number 1   Number of Visits 24   Date for OT Re-Evaluation 04/06/16   OT Start Time [id number]   OT Stop Time 1609   OT Time Calculation (min) 54 min   Activity Tolerance Patient tolerated treatment well      Past Medical History  Diagnosis Date  . Hypertension     Echo s/p fall 11/15  . Arthritis     fingers, neck  . GERD (gastroesophageal reflux disease)     rare  . Depression   . Cancer of prostate ([place])   . Cancer of skin     Past Surgical History  Procedure Laterality Date  . Prostate surgery  2007  . Hernia repair  1999  . Kidney stone surgery    . Melanoma excision    . Eye surgery Left 09/25/14    cataract @[facility]   . Cataract extraction w/phaco Right 11/27/2014    Procedure: CATARACT EXTRACTION PHACO AND INTRAOCULAR LENS PLACEMENT (IOC);  Surgeon: Leandrew Koyanagi, MD;  Location: [place];  Service: Ophthalmology;  Laterality: Right;    There were no vitals filed for this visit.      Subjective Assessment - 01/13/16 1644    Subjective  Pt. reports they will be going on vacation in middle July.   Patient is accompained by: Family member   Pertinent History Pt. is an 82 y.Fred Jones male who was admitted to [facility] on June 3rd with a Diagnosis of CVA. Pt. received OT services in acute care, and [place]. Pt. now is ready for outpatient OT services.   Currently in Pain? No/denies           [facility] OT Assessment - 01/13/16 1522    Assessment   Diagnosis CVA   Referring Provider Manuella Ghazi   Onset Date 12/20/15   Prior Therapy OT in Acute care, and Home health OT   Precautions   Precautions  None   Balance Screen   Has the patient fallen in the past 6 months Yes   How many times? 5   Has the patient had a decrease in activity level because of a fear of falling?  No   Is the patient reluctant to leave their home because of a fear of falling?  No   Home  Environment   Family/patient expects to be discharged to: Private residence   Living Arrangements Spouse/significant other   Available Help at Discharge Family   Type of [place] Access Level entry   Home Layout Two level   Bathroom Shower/Tub None   Physiological scientist - built in   Prior Function   Level of Independence Independent   Vocation Self employed   ADL   Eating/Feeding Independent   Grooming Independent   Scientist, clinical (histocompatibility and immunogenetics) Independent   Lower Body Bathing Independent   Upper Body Dressing Increased time   Upper Body Dressing Patient Percentage --  Difi   Lower Body Dressing Independent   Acupuncturist -  Hygiene Independent  ADL comments Pt. Scored an 84.3 on the MAM-20. Pt. has difficulty buttoning small buttons, writing sentences legibly, manipulating, and preparing medicine, cutting watermelon   IADL   Shopping Assistance for transportation;Needs to be accompanied on any shopping trip   Light Housekeeping Does not participate in any housekeeping tasks   Meal Prep Able to complete simple cold meal and snack prep   Medication Management Is responsible for taking medication in correct dosages at correct time   Financial Management Requires assistance   Written Expression   Dominant Hand Right   Handwriting 50% legible   Vision - History   Baseline Vision Wears glasses for distance only   Vision Assessment   Comment No vision changes for baseline.   Cognition   Overall Cognitive Status Cognition to be further assessed in functional context PRN   Sensation   Light Touch Appears  Intact   Stereognosis Appears Intact   Proprioception Appears Intact   Coordination   Gross Motor Movements are Fluid and Coordinated No   Fine Motor Movements are Fluid and Coordinated No   Finger Nose Finger Test impaired   Right 9 Hole Peg Test 41   Left 9 Hole Peg Test 26   AROM   Overall AROM Comments BUE  AROM WFL   Strength   Overall Strength Comments LUE WFL, RUE strength: shoulder flexion, abduction 4+/5, elbow flexion/extension, forearm supination pronation, and wrist extension 5/5   Hand Function   Right Hand Grip (lbs) 54   Right Hand Lateral Pinch 16 lbs   Right Hand 3 Point Pinch 12 lbs   Left Hand Grip (lbs) 74   Left Hand Lateral Pinch 16 lbs   Left 3 point pinch 15 lbs                         OT Education - 01/13/16 1648    Education provided Yes   Education Details Pt. ed was provided about OT services and stroke recovery.   Person(s) Educated Patient;Spouse   Methods Explanation   Comprehension Verbalized understanding             OT Long Term Goals - 01/13/16 1703    OT LONG TERM GOAL #1   Title Pt. will be independent buttoning the small buttons on the collar of a dress shirt   Baseline Pt. is unable   Time 12   Period Weeks   Status New   OT LONG TERM GOAL #2   Title Pt. will improve coordination by 5 sec. to be able to grasp medication   Baseline 41 sec.   Time 12   Period Weeks   Status New   OT LONG TERM GOAL #3   Title Pt. will write 3 sentences with 100% legibility.   Baseline Pt. abler to sign signature with 75% legibly   Time 12   Period Weeks   Status New   OT LONG TERM GOAL #4   Title Pt. will increase grip strength to be able to cut/slice watermelon.   Baseline Pt. has difficulty cutting watermelon. Grip strength: 54#   Time 12   Period Weeks   Status New               Plan - 01/13/16 1651    Clinical Impression Statement Pt. is an 82 y.o.male who arrived for outpatient OT services with his wife.  Pt. presents with impaired dominant RUE grip strength, pinch strength, and impaired [facility] skills.  Pt. has difficulty with wriitng legibly, buttoning small buttons on a dress shirt especially small buttons near the tie, grasping medication, and cutting/slicing watermelon. Pt. scored 84.3  on MAM-20. Pt. could benefit from skilled OT services to improve his dominant RUE and hand during ADLs.[place].    Rehab Potential Good   Clinical Impairments Affecting Rehab Potential Positive indicators: age, motivation, family support. Negative Indicators: multiple comorbidities   OT Frequency 2x / week   OT Duration 12 weeks   OT Treatment/Interventions Self-care/ADL training;Neuromuscular education;Therapeutic exercise;DME and/or AE instruction;Patient/family education;Therapeutic exercises;Therapeutic activities      Patient will benefit from skilled therapeutic intervention in order to improve the following deficits and impairments:  Decreased coordination, Pain, Decreased knowledge of precautions, Decreased knowledge of use of DME, Impaired UE functional use, Decreased strength  Visit Diagnosis: Muscle weakness (generalized) - Plan: Ot plan of care cert/re-cert  Other lack of coordination - Plan: Ot plan of care cert/re-cert    Problem List Patient Active Problem List   Diagnosis Date Noted  . Stroke (cerebrum) ([place]) 12/20/2015  . HTN (hypertension) 12/20/2015  . GERD (gastroesophageal reflux disease) 12/20/2015  . Depression 12/20/2015   Harrel Carina, MS, OTR/L  Harrel Carina 01/13/2016, 5:21 PM  [place] MAIN [facility] SERVICES [address] [place], [place], [address] Phone: [phone]   Fax:  [phone]  Name: DMARCUS ALISON Sr. MRN: [id number] Date of Birth: 1930-11-08

## 2016-01-15 ENCOUNTER — Ambulatory Visit: Payer: PPO | Admitting: Occupational Therapy

## 2016-01-15 DIAGNOSIS — M6281 Muscle weakness (generalized): Secondary | ICD-10-CM | POA: Diagnosis not present

## 2016-01-15 DIAGNOSIS — G4733 Obstructive sleep apnea (adult) (pediatric): Secondary | ICD-10-CM | POA: Diagnosis not present

## 2016-01-15 DIAGNOSIS — R278 Other lack of coordination: Secondary | ICD-10-CM

## 2016-01-15 NOTE — Patient Instructions (Addendum)
OT TREATMENT    Neuro muscular re-education:  Pt. performed [facility] skills training to improve speed and dexterity needed for ADL tasks and writing. Pt. demonstrated grasping 1 inch sticks,  inch cylindrical collars, and  inch flat washers on the [employer] pegboard. Pt. performed grasping each item with her 2nd digit and thumb, and storing them in the palm. Pt. presented with difficulty storing  inch objects at a time in the palmar aspect of the hand.  Therapeutic Exercise:  Pt. Worked on [employer] for 8 min. Forward and reverse with constant monitoring of the right UE on the handle. Pt. Requires cues for initiating. Pt. completed level 3.0. Pt. performed 2.5# dowel ex. For UE strengthening secondary to weakness. Bilateral shoulder flexion, chest press, circular patterns, and 3# dumbbell ex. elbow flexion/extension were performed, wrist flexion, extension,  and radial deviation. Pt. performed gross gripping with grip strengthener. Pt. worked on sustaining grip while grasping pegs and reaching at various heights. Gripper was placed at 3rd resistive slot.

## 2016-01-15 NOTE — Therapy (Signed)
[place] MAIN [facility] SERVICES [address] [place], [place], [address] Phone: [phone]   Fax:  [phone]  Occupational Therapy Treatment  Patient Details  Name: Fred SIEMON Sr. MRN: [id number] Date of Birth: [birth year] Referring Provider: Manuella Ghazi  Encounter Date: 01/15/2016      OT End of Session - 01/15/16 1635    Visit Number 2   Number of Visits 24   Date for OT Re-Evaluation 04/06/16   OT Start Time 1300   OT Stop Time 1345   OT Time Calculation (min) 45 min   Activity Tolerance Patient tolerated treatment well   Behavior During Therapy [facility] for tasks assessed/performed      Past Medical History  Diagnosis Date  . Hypertension     Echo s/p fall 11/15  . Arthritis     fingers, neck  . GERD (gastroesophageal reflux disease)     rare  . Depression   . Cancer of prostate ([place])   . Cancer of skin     Past Surgical History  Procedure Laterality Date  . Prostate surgery  2007  . Hernia repair  1999  . Kidney stone surgery    . Melanoma excision    . Eye surgery Left 09/25/14    cataract @[facility]   . Cataract extraction w/phaco Right 11/27/2014    Procedure: CATARACT EXTRACTION PHACO AND INTRAOCULAR LENS PLACEMENT (IOC);  Surgeon: Leandrew Koyanagi, MD;  Location: [place];  Service: Ophthalmology;  Laterality: Right;    There were no vitals filed for this visit.      Subjective Assessment - 01/15/16 1633    Subjective  Pt. reports they have a PT evaluation next week on Monday.   Patient is accompained by: Family member   Pertinent History Pt. is an 89 y.[name] male who was admitted to [facility] on June 3rd with a Diagnosis of CVA. Pt. received OT services in acute care, and [place]. Pt. now is ready for outpatient OT services.        OT TREATMENT    Neuro muscular re-education:  Pt. performed [facility] skills training to improve speed and dexterity needed for ADL tasks and writing. Pt. demonstrated grasping 1  inch sticks,  inch cylindrical collars, and  inch flat washers on the [employer]ue pegboard. Pt. performed grasping each item with her 2nd digit and thumb, and storing them in the palm. Pt. presented with difficulty storing  inch objects at a time in the palmar aspect of the hand.  Therapeutic Exercise:  Pt. Worked on [employer] for 8 min. Forward and reverse with constant monitoring of the right UE on the handle. Pt. Requires cues for initiating. Pt. completed level 3.0. Pt. performed 2.5# dowel ex. For UE strengthening secondary to weakness. Bilateral shoulder flexion, chest press, circular patterns, and 3# dumbbell ex. elbow flexion/extension were performed, wrist flexion, extension,  and radial deviation. Pt. performed gross gripping with grip strengthener. Pt. worked on sustaining grip while grasping pegs and reaching at various heights. Gripper was placed at 3rd resistive slot.                         OT Education - 01/15/16 1634    Education provided Yes   Education Details RUE strength, and coordination   Person(s) Educated Patient   Methods Explanation   Comprehension Verbalized understanding             OT Long Term Goals - 01/13/16 1703  OT LONG TERM GOAL #1   Title Pt. will be independent buttoning the small buttons on the collar of a dress shirt   Baseline Pt. is unable   Time 12   Period Weeks   Status New   OT LONG TERM GOAL #2   Title Pt. will improve coordination by 5 sec. to be able to grasp medication   Baseline 41 sec.   Time 12   Period Weeks   Status New   OT LONG TERM GOAL #3   Title Pt. will write 3 sentences with 100% legibility.   Baseline Pt. abler to sign signature with 75% legibly   Time 12   Period Weeks   Status New   OT LONG TERM GOAL #4   Title Pt. will increase grip strength to be able to cut/slice watermelon.   Baseline Pt. has difficulty cutting watermelon. Grip strength: 54#   Time 12   Period Weeks   Status New                Plan - 01/15/16 1636    Clinical Impression Statement Pt. continues to require work on RUE, grip, pinch strength, and coordination skills in order to be able to use it during ADL tasks. Pt. has difficulty grasping small objects and moving them within his hand. Pt. conitnues to benefit from skilled OT services to work on improving Right hand function, and coordination, during ADLs.   Rehab Potential Good   Clinical Impairments Affecting Rehab Potential Positive indicators: age, motivation, family support. Negative Indicators: multiple comorbidities   OT Frequency 2x / week   OT Duration 12 weeks   OT Treatment/Interventions Self-care/ADL training;Neuromuscular education;Therapeutic exercise;DME and/or AE instruction;Patient/family education;Therapeutic exercises;Therapeutic activities      Patient will benefit from skilled therapeutic intervention in order to improve the following deficits and impairments:  Decreased coordination, Pain, Decreased knowledge of precautions, Decreased knowledge of use of DME, Impaired UE functional use, Decreased strength  Visit Diagnosis: Other lack of coordination  Muscle weakness (generalized)      G-Codes - 02/10/16 1343    Functional Assessment Tool Used Clinical observation based on pt. current functional status, 9 hole peg test, MMT, dynamometer testing, pinch gauge testing, and MAM-20.    Functional Limitation Self care   Self Care Current Status [phone]) At least 1 percent but less than 20 percent impaired, limited or restricted   Self Care Goal Status [id number]) 0 percent impaired, limited or restricted      Problem List Patient Active Problem List   Diagnosis Date Noted  . Stroke (cerebrum) ([place]) 12/20/2015  . HTN (hypertension) 12/20/2015  . GERD (gastroesophageal reflux disease) 12/20/2015  . Depression 12/20/2015   Harrel Carina, MS, OTR/L  Harrel Carina 01/15/2016, 4:56 PM  [place] MAIN [facility] SERVICES [address] [place], [place], [address] Phone: [phone]   Fax:  [phone]  Name: Fred GONSALEZ Sr. MRN: [id number] Date of Birth: July 04, 1931

## 2016-01-19 ENCOUNTER — Ambulatory Visit: Payer: PPO

## 2016-01-19 ENCOUNTER — Ambulatory Visit: Payer: PPO | Attending: Neurology | Admitting: Occupational Therapy

## 2016-01-19 DIAGNOSIS — M6281 Muscle weakness (generalized): Secondary | ICD-10-CM | POA: Insufficient documentation

## 2016-01-19 DIAGNOSIS — R278 Other lack of coordination: Secondary | ICD-10-CM | POA: Diagnosis not present

## 2016-01-19 NOTE — Therapy (Signed)
[place] MAIN [facility] SERVICES [address] [place], [place], [address] Phone: [phone]   Fax:  [phone]  Occupational Therapy Treatment  Patient Details  Name: Fred Jones Sr. MRN: [id number] Date of Birth: 02/14/1931 Referring Provider: Manuella Ghazi  Encounter Date: 01/19/2016      OT End of Session - 01/19/16 1731    Visit Number 3   Number of Visits 24   Date for OT Re-Evaluation 04/06/16   OT Start Time 1430   OT Stop Time 1515   OT Time Calculation (min) 45 min   Activity Tolerance Patient tolerated treatment well   Behavior During Therapy [facility] for tasks assessed/performed      Past Medical History  Diagnosis Date  . Hypertension     Echo s/p fall 11/15  . Arthritis     fingers, neck  . GERD (gastroesophageal reflux disease)     rare  . Depression   . Cancer of prostate ([place])   . Cancer of skin     Past Surgical History  Procedure Laterality Date  . Prostate surgery  2007  . Hernia repair  1999  . Kidney stone surgery    . Melanoma excision    . Eye surgery Left 09/25/14    cataract @[facility]   . Cataract extraction w/phaco Right 11/27/2014    Procedure: CATARACT EXTRACTION PHACO AND INTRAOCULAR LENS PLACEMENT (IOC);  Surgeon: Leandrew Koyanagi, MD;  Location: [place];  Service: Ophthalmology;  Laterality: Right;    There were no vitals filed for this visit.      Subjective Assessment - 01/19/16 1729    Subjective  Pt. will be out of town on vacation next week.   Patient is accompained by: Family member   Pertinent History Pt. is an 27 y.Fred Jones male who was admitted to [facility] on June 3rd with a Diagnosis of CVA. Pt. received OT services in acute care, and [place]. Pt. now is ready for outpatient OT services.         OT TREATMENT    Neuro muscular re-education:  Pt. Worked on grasping 1" resistive cubes from a vertical angle. Pt. Worked on removing them with 2nd through 5th digits.Pt. Worked on  isolating each digit to press them back into place.  Therapeutic Exercise:  Pt. worked on UE strengthening with green theraband. Pt. Worked on shoulder flexion, extension, horizontal abduction, diagonal shoulder abduction, elbow flexion, extension. Pt. requires visual cues, a visual handout, and tactile cues for motor planning through the movement. Pt. worked on pinch strengthening in the left hand for lateral, and 3pt. pinch using red, green, and blue resistive clips.Tactlie and verbal cues were required for eliciting the desired movement.                        OT Education - 01/19/16 1730    Education provided Yes   Education Details Pt. education about ther. ex. and [place] movements.   Person(s) Educated Patient   Methods Explanation   Comprehension Verbalized understanding             OT Long Term Goals - 01/13/16 1703    OT LONG TERM GOAL #1   Title Pt. will be independent buttoning the small buttons on the collar of a dress shirt   Baseline Pt. is unable   Time 12   Period Weeks   Status New   OT LONG TERM GOAL #2   Title Pt.  will improve coordination by 5 sec. to be able to grasp medication   Baseline 41 sec.   Time 12   Period Weeks   Status New   OT LONG TERM GOAL #3   Title Pt. will write 3 sentences with 100% legibility.   Baseline Pt. abler to sign signature with 75% legibly   Time 12   Period Weeks   Status New   OT LONG TERM GOAL #4   Title Pt. will increase grip strength to be able to cut/slice watermelon.   Baseline Pt. has difficulty cutting watermelon. Grip strength: 54#   Time 12   Period Weeks   Status New               Plan - 01/19/16 1731    Clinical Impression Statement Pt. has tolerated UE strengthening exercises, however requires visual demonstration, and tactile cues for motor planning through shoulder extension. Pt.continues to work on improving RUE strength and coordiantion skills, requiring increased cues for motor  planning movements. Pt. is able to initiate the movements  with cues , and requires cues to follow through.   Rehab Potential Good   Clinical Impairments Affecting Rehab Potential Positive indicators: age, motivation, family support. Negative Indicators: multiple comorbidities   OT Frequency 2x / week   OT Duration 12 weeks   OT Treatment/Interventions Self-care/ADL training;Neuromuscular education;Therapeutic exercise;DME and/or AE instruction;Patient/family education;Therapeutic exercises;Therapeutic activities      Patient will benefit from skilled therapeutic intervention in order to improve the following deficits and impairments:  Decreased coordination, Pain, Decreased knowledge of precautions, Decreased knowledge of use of DME, Impaired UE functional use, Decreased strength  Visit Diagnosis: Other lack of coordination  Muscle weakness (generalized)    Problem List Patient Active Problem List   Diagnosis Date Noted  . Stroke (cerebrum) ([place]) 12/20/2015  . HTN (hypertension) 12/20/2015  . GERD (gastroesophageal reflux disease) 12/20/2015  . Depression 12/20/2015   Harrel Carina, MS, OTR/L   Harrel Carina 01/19/2016, 5:47 PM  [place] MAIN [facility] SERVICES [address] [place], [place], [address] Phone: [phone]   Fax:  [phone]  Name: Fred RABUCK Sr. MRN: [id number] Date of Birth: 1931/01/19

## 2016-01-19 NOTE — Therapy (Signed)
[place] MAIN [facility] SERVICES [address] [place], [place], [address] Phone: [phone]   Fax:  [phone]  Physical Therapy Evaluation  Patient Details  Name: Fred LUSKEY Sr. MRN: [id number] Date of Birth: [birth year] Referring Provider: Dr. Manuella Ghazi  Encounter Date: 01/19/2016      PT End of Session - 01/19/16 1714    Visit Number 1   Number of Visits 1   PT Start Time [id number]   PT Stop Time 1600   PT Time Calculation (min) 45 min   Equipment Utilized During Treatment Gait belt   Activity Tolerance Patient tolerated treatment well   Behavior During Therapy [facility] for tasks assessed/performed      Past Medical History  Diagnosis Date  . Hypertension     Echo s/p fall 11/15  . Arthritis     fingers, neck  . GERD (gastroesophageal reflux disease)     rare  . Depression   . Cancer of prostate ([place])   . Cancer of skin     Past Surgical History  Procedure Laterality Date  . Prostate surgery  2007  . Hernia repair  1999  . Kidney stone surgery    . Melanoma excision    . Eye surgery Left 09/25/14    cataract @[facility]   . Cataract extraction w/phaco Right 11/27/2014    Procedure: CATARACT EXTRACTION PHACO AND INTRAOCULAR LENS PLACEMENT ([place]);  Surgeon: Leandrew Koyanagi, MD;  Location: [place];  Service: Ophthalmology;  Laterality: Right;    There were no vitals filed for this visit.       Subjective Assessment - 01/19/16 1523    Subjective pt reports having CVA 12/20/15 and reports having RUE deficits > LE. He denies numbness/tingling or weakness in RLE. He reports having 1 fall since the stroke last week when he tripped over a CPAP case that was sitting in the floor. He reports being fully independent prior to the stroke. He has not participated in any yardwork or housework since the stroke due to fear/being cautious of over-exerting himself. pt reports he has the most difficulty rising from a chair, which has worsened since  the stroke. He feels more off balanced since the stroke, but denies RLE weakness or c/o dizziness. He and his wife report mild memory deficits since the stroke.    Patient is accompained by: Family member   Limitations Walking   Currently in Pain? No/denies            [facility] PT Assessment - 01/19/16 0001    Assessment   Medical Diagnosis CVA   Referring Provider Dr. Manuella Ghazi   Onset Date/Surgical Date 12/20/15   Hand Dominance Right   Prior Therapy no   Precautions   Precautions None   Balance Screen   Has the patient fallen in the past 6 months Yes   How many times? 1   Has the patient had a decrease in activity level because of a fear of falling?  No   Is the patient reluctant to leave their home because of a fear of falling?  No   Home Social worker Private residence   Living Arrangements Spouse/significant other   Available Help at Discharge Family   Type of [place] Access Level entry   [place] Two level   Alternate Level Stairs-Number of Steps 12   [place] in   Prior Function   Level of Independence Independent  Vocation Self employed   Standardized Balance Assessment   Standardized Balance Assessment Berg Balance Test;Five Times Sit to Stand;10 meter walk test;Dynamic Gait Index   Five times sit to stand comments  19.85s   10 Meter Walk 1.8m/s   Berg Balance Test   Sit to Stand Able to stand without using hands and stabilize independently   Standing Unsupported Able to stand safely 2 minutes   Sitting with Back Unsupported but Feet Supported on Floor or Stool Able to sit safely and securely 2 minutes   Stand to Sit Sits safely with minimal use of hands   Transfers Able to transfer safely, minor use of hands   Standing Unsupported with Eyes Closed Able to stand 10 seconds safely   Standing Ubsupported with Feet Together Able to place feet together independently and stand 1 minute safely   From Standing,  Reach Forward with Outstretched Arm Can reach forward >12 cm safely (5")   From Standing Position, Pick up Object from Floor Able to pick up shoe safely and easily   From Standing Position, Turn to Look Behind Over each Shoulder Looks behind from both sides and weight shifts well   Turn 360 Degrees Able to turn 360 degrees safely in 4 seconds or less   Standing Unsupported, Alternately Place Feet on Step/Stool Able to stand independently and safely and complete 8 steps in 20 seconds   Standing Unsupported, One Foot in Front Able to plae foot ahead of the other independently and hold 30 seconds   Standing on One Leg Able to lift leg independently and hold 5-10 seconds   Total Score 53   Dynamic Gait Index   Level Surface Normal   Change in Gait Speed Mild Impairment   Gait with Horizontal Head Turns Mild Impairment   Gait with Vertical Head Turns Normal   Gait and Pivot Turn Mild Impairment   Step Over Obstacle Normal   Step Around Obstacles Normal   Steps Mild Impairment   Total Score 20      REFLEXES L3: WNL BLE S2: diminished BLE  SENSATION WNL BLE  COORDINATION/PROPRIOCEPTION RAMPs: RLE WNL  Proprioception:RLE WNL  TONE/SPASTICITY No impairment identified   COGNITION Impaired STM at baseline CRANIAL NERVES  Not tested  ROM  Left Right  Hip flexion WNL WNL  Hip extension WNL WNL  Hip IR/ER    Hip abduction WNL WNL  Hip adduction WNL WNL  Knee flexion WNL WNL  Knee extension WNL WNL  Ankle DF WNL WNL  Ankle PF     STRENGTH (on scale 0-5/5)  Left Right  Hip flexion 5 5  Hip extension 4 4  Hip IR/ER    Hip abduction 4 4  Hip adduction 5 5  Knee flexion 5 5  Knee extension 5 5  Ankle DF 5 5  Ankle PF     SPECIAL TESTS Clonus: negative RLE  GAIT/MOBILITY WNL; does not use assistive device, decreased pelvic rotation  FUNCTIONAL MOVEMENTS Pt able to ambulate stairs reciprocally and uses 1 rail throughout SLS mild deficits BLE Transfers WNL          PT Education - 01/19/16 1713    Education provided Yes   Education Details exam findings, level of function   Person(s) Educated Patient   Methods Explanation   Comprehension Verbalized understanding             Plan - 01/19/16 1714    Clinical Impression Statement pt is 82 YO M s/p CVA  on 12/20/15. pt presents to therapy today with sensory, motor, coordination, and proprioception of LE all WNL. pt did have minor weakness in his hip abd and glute muscles (4/5). pt had very minor balance deficits but was not deemed a fall risk according to his BERG and DGI scores. pt gait speed is WNL and he falls under the community ambulator classification. At this time, pt has no outstanding needs for PT and was given an HEP for hip strengthening. pt was educated that if in the future he began to notice any changes in strength or balance, to see his doctor for a new PT script.    PT Frequency One time visit   Consulted and Agree with Plan of Care Patient;Family member/caregiver   Family Member Consulted wife      Patient will benefit from skilled therapeutic intervention in order to improve the following deficits and impairments:     Visit Diagnosis: Other lack of coordination      G-Codes - 2016/02/10 1836    Functional Assessment Tool Used [id number]/6mwalk    Functional Limitation Mobility: Walking and moving around   Mobility: Walking and Moving Around Current Status [id number]) At least 1 percent but less than 20 percent impaired, limited or restricted   Mobility: Walking and Moving Around Goal Status [phone]) At least 1 percent but less than 20 percent impaired, limited or restricted   Mobility: Walking and Moving Around Discharge Status [phone]) At least 1 percent but less than 20 percent impaired, limited or restricted       Problem List Patient Active Problem List   Diagnosis Date Noted  . Stroke (cerebrum) ([place]) 12/20/2015  . HTN (hypertension) 12/20/2015  . GERD  (gastroesophageal reflux disease) 12/20/2015  . Depression 12/20/2015   Geraldine Solar, SPT This entire session was performed under direct supervision and direction of a licensed therapist/therapist assistant . I have personally read, edited and approve of the note as written. Gorden Harms. Tortorici, PT, DPT [phone]   Tortorici,Ashley 02/10/2016, 6:37 PM  [place] MAIN [facility] SERVICES [address] [place], [place], [address] Phone: [phone]   Fax:  [phone]  Name: Fred PREUSS Sr. MRN: [id number] Date of Birth: March 08, 1931

## 2016-01-19 NOTE — Patient Instructions (Signed)
OT TREATMENT    Neuro muscular re-education:  Pt. Worked on grasping 1" resistive cubes from a vertical angle. Pt. Worked on removing them with 2nd through 5th digits.Pt. Worked on isolating each digit to press them back into place.  Therapeutic Exercise:  Pt. worked on UE strengthening with green theraband. Pt. Worked on shoulder flexion, extension, horizontal abduction, diagonal shoulder abduction, elbow flexion, extension. Pt. requires visual cues, a visual handout, and tactile cues for motor planning through the movement. Pt. worked on pinch strengthening in the left hand for lateral, and 3pt. pinch using red, green, and blue resistive clips.Tactlie and verbal cues were required for eliciting the desired movement.

## 2016-01-21 ENCOUNTER — Ambulatory Visit: Payer: PPO

## 2016-01-21 ENCOUNTER — Ambulatory Visit: Payer: PPO | Admitting: Occupational Therapy

## 2016-01-21 DIAGNOSIS — M6281 Muscle weakness (generalized): Secondary | ICD-10-CM

## 2016-01-21 DIAGNOSIS — R278 Other lack of coordination: Secondary | ICD-10-CM | POA: Diagnosis not present

## 2016-01-21 NOTE — Therapy (Signed)
[place] MAIN [facility] SERVICES [address] [place], [place], [address] Phone: [phone]   Fax:  [phone]  Occupational Therapy Treatment  Patient Details  Name: Fred DURRER Sr. MRN: [id number] Date of Birth: [birth year] Referring Provider: Manuella Ghazi  Encounter Date: 01/21/2016      OT End of Session - 01/21/16 1735    Visit Number 4   Number of Visits 24   Date for OT Re-Evaluation 04/06/16   Authorization Type Medicare G code 4 of 10   OT Start Time 1515   OT Stop Time 1600   OT Time Calculation (min) 45 min   Activity Tolerance Patient tolerated treatment well   Behavior During Therapy [facility] for tasks assessed/performed      Past Medical History  Diagnosis Date  . Hypertension     Echo s/p fall 11/15  . Arthritis     fingers, neck  . GERD (gastroesophageal reflux disease)     rare  . Depression   . Cancer of prostate ([place])   . Cancer of skin     Past Surgical History  Procedure Laterality Date  . Prostate surgery  2007  . Hernia repair  1999  . Kidney stone surgery    . Melanoma excision    . Eye surgery Left 09/25/14    cataract @[facility]   . Cataract extraction w/phaco Right 11/27/2014    Procedure: CATARACT EXTRACTION PHACO AND INTRAOCULAR LENS PLACEMENT ([facility]);  Surgeon: Leandrew Koyanagi, MD;  Location: [place];  Service: Ophthalmology;  Laterality: Right;    There were no vitals filed for this visit.      Subjective Assessment - 01/21/16 1733    Subjective  Pt. will be unable to return for 2 weeks due to a previously schedule family vacation.   Patient is accompained by: Family member   Pertinent History Pt. is an 16 y.Fred Jones male who was admitted to [facility] on June 3rd with a Diagnosis of CVA. Pt. received OT services in acute care, and [place]. Pt. now is ready for outpatient OT services.   Currently in Pain? No/denies             OT TREATMENT    Neuro muscular re-education:  Pt. performed  [facility] tasks using the Grooved pegboard. Pt. worked on grasping the grooved pegs from a horizontal position, and moving the pegs to a vertical position in the hand to prepare for placing them in the grooved slot. Pt. Worked on removing the pegs using thumb opposition t the 2nd through 5th digits.  Therapeutic Exercise:  Pt. Performed BUE strengthening with green theraband. Pt. Required shoulder flexion, horizontal abduction, and elbow extension. Pt. Requires visual demonstration, visual, and verbal cues to complete. Pt. Performed hand strengthening with green theraputty. Pt. required cues for proper technique. Pt. worked on gross grip loop, lateral pinch, 3pt. pinch, gross digit extension, Thumb abduction, digit extension table spread, thumb opposition to 2nd through 5th digits, and lumbical ex,  Pt. required verbal and tactile cues for proper technique. Pt. performed gross gripping with grip strengthener. Pt. worked on sustaining grip while grasping pegs and reaching at various heights. Pt. Worked with gripper in the 3rd resistve slot.                         OT Long Term Goals - 01/13/16 1703    OT LONG TERM GOAL #1   Title Pt. will be independent buttoning the  small buttons on the collar of a dress shirt   Baseline Pt. is unable   Time 12   Period Weeks   Status New   OT LONG TERM GOAL #2   Title Pt. will improve coordination by 5 sec. to be able to grasp medication   Baseline 41 sec.   Time 12   Period Weeks   Status New   OT LONG TERM GOAL #3   Title Pt. will write 3 sentences with 100% legibility.   Baseline Pt. abler to sign signature with 75% legibly   Time 12   Period Weeks   Status New   OT LONG TERM GOAL #4   Title Pt. will increase grip strength to be able to cut/slice watermelon.   Baseline Pt. has difficulty cutting watermelon. Grip strength: 54#   Time 12   Period Weeks   Status New               Plan - 01/21/16 1736    Clinical Impression  Statement Pt. is improving with  RUE strengthening overall, however requires visual, verbal, and tactile cues for motor planning through the theraband exercises. Pt. was able to perform hand exercises with green theraputty, requiring minimal cuing and visual demonstration. Pt. requiires continued skilled OT services to work on improving RU strength, ROM, and coordination skills.     Rehab Potential Good   Clinical Impairments Affecting Rehab Potential Positive indicators: age, motivation, family support. Negative Indicators: multiple comorbidities   OT Frequency 2x / week   OT Duration 12 weeks   OT Treatment/Interventions Self-care/ADL training;Neuromuscular education;Therapeutic exercise;DME and/or AE instruction;Patient/family education;Therapeutic exercises;Therapeutic activities      Patient will benefit from skilled therapeutic intervention in order to improve the following deficits and impairments:  Decreased coordination, Pain, Decreased knowledge of precautions, Decreased knowledge of use of DME, Impaired UE functional use, Decreased strength  Visit Diagnosis: Muscle weakness (generalized)  Other lack of coordination    Problem List Patient Active Problem List   Diagnosis Date Noted  . Stroke (cerebrum) ([place]) 12/20/2015  . HTN (hypertension) 12/20/2015  . GERD (gastroesophageal reflux disease) 12/20/2015  . Depression 12/20/2015   Harrel Carina, MS, OTR/L  Harrel Carina 01/21/2016, 5:48 PM  [place] MAIN [facility] SERVICES [address] [place], [place], [address] Phone: [phone]   Fax:  [phone]  Name: Fred WHITEN Sr. MRN: [id number] Date of Birth: [birth year]

## 2016-01-21 NOTE — Patient Instructions (Signed)
OT TREATMENT    Neuro muscular re-education:  Pt. performed [facility] tasks using the Grooved pegboard. Pt. worked on grasping the grooved pegs from a horizontal position, and moving the pegs to a vertical position in the hand to prepare for placing them in the grooved slot. Pt. Worked on removing the pegs using thumb opposition t the 2nd through 5th digits.  Therapeutic Exercise:  Pt. Performed BUE strengthening with green theraband. Pt. Required shoulder flexion, horizontal abduction, and elbow extension. Pt. Requires visual demonstration, visual, and verbal cues to complete. Pt. Performed hand strengthening with green theraputty. Pt. required cues for proper technique. Pt. worked on gross grip loop, lateral pinch, 3pt. pinch, gross digit extension, Thumb abduction, digit extension table spread, thumb opposition to 2nd through 5th digits, and lumbical ex,  Pt. required verbal and tactile cues for proper technique. Pt. performed gross gripping with grip strengthener. Pt. worked on sustaining grip while grasping pegs and reaching at various heights. Pt. Worked with gripper in the 3rd resistve slot.

## 2016-01-23 DIAGNOSIS — G4733 Obstructive sleep apnea (adult) (pediatric): Secondary | ICD-10-CM | POA: Diagnosis not present

## 2016-02-09 ENCOUNTER — Ambulatory Visit: Payer: PPO

## 2016-02-09 ENCOUNTER — Ambulatory Visit: Payer: PPO | Admitting: Occupational Therapy

## 2016-02-09 DIAGNOSIS — R278 Other lack of coordination: Secondary | ICD-10-CM | POA: Diagnosis not present

## 2016-02-09 DIAGNOSIS — M6281 Muscle weakness (generalized): Secondary | ICD-10-CM

## 2016-02-09 NOTE — Therapy (Signed)
[place] MAIN [facility] SERVICES [address] [place], [place], [address] Phone: [phone]   Fax:  [phone]  Occupational Therapy Treatment  Patient Details  Name: Fred Jones Sr. MRN: [id number] Date of Birth: 1931-01-16 Referring Provider: Manuella Ghazi  Encounter Date: 02/09/2016      OT End of Session - 02/09/16 1704    Visit Number 5   Number of Visits 24   Date for OT Re-Evaluation 04/06/16   Authorization Type Medicare G code 5 of 10   OT Start Time 1520   OT Stop Time 1600   OT Time Calculation (min) 40 min   Activity Tolerance Patient tolerated treatment well   Behavior During Therapy [facility] for tasks assessed/performed      Past Medical History:  Diagnosis Date  . Arthritis    fingers, neck  . Cancer of prostate ([place])   . Cancer of skin   . Depression   . GERD (gastroesophageal reflux disease)    rare  . Hypertension    Echo s/p fall 11/15    Past Surgical History:  Procedure Laterality Date  . CATARACT EXTRACTION W/PHACO Right 11/27/2014   Procedure: CATARACT EXTRACTION PHACO AND INTRAOCULAR LENS PLACEMENT ([facility]);  Surgeon: Leandrew Koyanagi, MD;  Location: [place];  Service: Ophthalmology;  Laterality: Right;  . EYE SURGERY Left 09/25/14   cataract @[facility]   . HERNIA REPAIR  1999  . KIDNEY STONE SURGERY    . MELANOMA EXCISION    . PROSTATE SURGERY  2007    There were no vitals filed for this visit.      Subjective Assessment - 02/09/16 1620    Subjective  Pt. [place]. Pt. wife is concerned about his walking, and is requesting PT to reassess him.   Patient is accompained by: Family member      OT TREATMENT    Therapeutic Exercise:  Pt. Worked on the [employer] for 8 min. With constant monitoring of the RUE. Pt. required readjustment of the hand on the handle 3 times throughout the duration. Pt. worked out on level 2.5. Pt. performed gross gripping with grip strengthener. Pt.  worked on sustaining grip while grasping pegs and reaching at various heights. Pt. worked on the 4th resistive slot, and upgraded to the 5th. Pt. Worked on pinch strengthening in the left hand for lateral, and 3 pt. Pinch using green, and blue resistive clips.Tactlie and verbal cues were required for eliciting the desired movement. Pt. Education was provided, and review of green theraputty ex.  Selfcare:  Pt. Worked on Estate agent. Pt. Presented with a mature grasp on the pen, adequate spacing, no deviation from the line, and 50% legibility.                            OT Education - 02/09/16 1703    Education provided Yes   Education Details RUE strength, and hand coodination, green theraputty ex.   Person(s) Educated Patient   Methods Explanation   Comprehension Verbalized understanding             OT Long Term Goals - 01/13/16 1703      OT LONG TERM GOAL #1   Title Pt. will be independent buttoning the small buttons on the collar of a dress shirt   Baseline Pt. is unable   Time 12   Period Weeks   Status New     OT LONG TERM  GOAL #2   Title Pt. will improve coordination by 5 sec. to be able to grasp medication   Baseline 41 sec.   Time 12   Period Weeks   Status New     OT LONG TERM GOAL #3   Title Pt. will write 3 sentences with 100% legibility.   Baseline Pt. abler to sign signature with 75% legibly   Time 12   Period Weeks   Status New     OT LONG TERM GOAL #4   Title Pt. will increase grip strength to be able to cut/slice watermelon.   Baseline Pt. has difficulty cutting watermelon. Grip strength: 54#   Time 12   Period Weeks   Status New               Plan - 02/09/16 1705    Clinical Impression Statement Pt. is making progress with RUE strength, grip strength, and pinch strength. Pt. continues to work on improving [facility] skills, and translaory movements of the hand needed for writing , manipulating, and handling objects.   Rehab  Potential Good   Clinical Impairments Affecting Rehab Potential Positive indicators: age, motivation, family support. Negative Indicators: multiple comorbidities   OT Frequency 2x / week   OT Treatment/Interventions Self-care/ADL training;Neuromuscular education;Therapeutic exercise;DME and/or AE instruction;Patient/family education;Therapeutic exercises;Therapeutic activities      Patient will benefit from skilled therapeutic intervention in order to improve the following deficits and impairments:  Decreased coordination, Pain, Decreased knowledge of precautions, Decreased knowledge of use of DME, Impaired UE functional use, Decreased strength  Visit Diagnosis: Other lack of coordination  Muscle weakness (generalized)    Problem List Patient Active Problem List   Diagnosis Date Noted  . Stroke (cerebrum) ([place]) 12/20/2015  . HTN (hypertension) 12/20/2015  . GERD (gastroesophageal reflux disease) 12/20/2015  . Depression 12/20/2015   Harrel Carina, MS, OTR/L  Harrel Carina 02/09/2016, 5:19 PM  [place] MAIN [facility] SERVICES [address] [place], [place], [address] Phone: [phone]   Fax:  [phone]  Name: Fred Jones Sr. MRN: [id number] Date of Birth: 02-Aug-1930

## 2016-02-09 NOTE — Patient Instructions (Signed)
OT TREATMENT    Therapeutic Exercise:  Pt. Worked on the [employer] for 8 min. With constant monitoring of the RUE. Pt. required readjustment of the hand on the handle 3 times throughout the duration. Pt. worked out on level 2.5. Pt. performed gross gripping with grip strengthener. Pt. worked on sustaining grip while grasping pegs and reaching at various heights. Pt. worked on the 4th resistive slot, and upgraded to the 5th. Pt. Worked on pinch strengthening in the left hand for lateral, and 3 pt. Pinch using green, and blue resistive clips.Tactlie and verbal cues were required for eliciting the desired movement. Pt. Education was provided, and review of green theraputty ex.  Selfcare:  Pt. Worked on Estate agent. Pt. Presented with a mature grasp on the pen, adequate spacing, no deviation from the line, and 50% legibility.

## 2016-02-11 ENCOUNTER — Ambulatory Visit: Payer: PPO

## 2016-02-11 ENCOUNTER — Ambulatory Visit: Payer: PPO | Admitting: Occupational Therapy

## 2016-02-11 DIAGNOSIS — R278 Other lack of coordination: Secondary | ICD-10-CM | POA: Diagnosis not present

## 2016-02-11 DIAGNOSIS — M6281 Muscle weakness (generalized): Secondary | ICD-10-CM

## 2016-02-11 DIAGNOSIS — G20 Parkinson's disease: Secondary | ICD-10-CM | POA: Diagnosis not present

## 2016-02-11 DIAGNOSIS — G4733 Obstructive sleep apnea (adult) (pediatric): Secondary | ICD-10-CM | POA: Diagnosis not present

## 2016-02-12 NOTE — Patient Instructions (Signed)
OT TREATMENT    Measurements were obtained for discharge  Therapeutic Exercise:  Pt. Worked on the [employer] for 8 min. Total forward and reverse  Motion every 4 min. Constant monitoring of the right hand was provided throughout the duration.  Selfcare:  Pt. worked on Media planner with his right hand for 3 sentences with 100$ legibility with printing with adequate speed. Pt. Worked on buttoning the top collar button of a dress shirt. Pt. Had difficulty manipulating the button with the button slot horizontal instead on vertical. Pt. Education was provided about various buttonhooks. Pt. Had difficulty using them secondary to the angle of the button slot on this shirt.

## 2016-02-12 NOTE — Therapy (Addendum)
[place] MAIN [facility] SERVICES [address] [place], [place], [address] Phone: [phone]   Fax:  [phone]  Occupational Therapy Treatment/Discharge Summary  Patient Details  Name: Fred WOOLSTENHULME Sr. MRN: [id number] Date of Birth: 03/16/1931 Referring Provider: Manuella Ghazi  Encounter Date: 02/11/2016      OT End of Session - 02/12/16 1009    Visit Number 6   Number of Visits 24   Date for OT Re-Evaluation 04/06/16   Authorization Type Medicare G code 6 of 10   OT Start Time 1515   OT Stop Time 1600   OT Time Calculation (min) 45 min   Activity Tolerance Patient tolerated treatment well   Behavior During Therapy [facility] for tasks assessed/performed      Past Medical History:  Diagnosis Date  . Arthritis    fingers, neck  . Cancer of prostate ([place])   . Cancer of skin   . Depression   . GERD (gastroesophageal reflux disease)    rare  . Hypertension    Echo s/p fall 11/15    Past Surgical History:  Procedure Laterality Date  . CATARACT EXTRACTION W/PHACO Right 11/27/2014   Procedure: CATARACT EXTRACTION PHACO AND INTRAOCULAR LENS PLACEMENT (IOC);  Surgeon: Leandrew Koyanagi, MD;  Location: [place];  Service: Ophthalmology;  Laterality: Right;  . EYE SURGERY Left 09/25/14   cataract @[facility]   . HERNIA REPAIR  1999  . KIDNEY STONE SURGERY    . MELANOMA EXCISION    . PROSTATE SURGERY  2007    There were no vitals filed for this visit.      Subjective Assessment - 02/12/16 1007    Subjective  Pt. reports his hand is getting back to his baseline functioning.   Patient is accompained by: Family member   Pertinent History Pt. is an 72 y.[name] male who was admitted to [facility] on June 3rd with a Diagnosis of CVA. Pt. received OT services in acute care, and [place]. Pt. now is ready for outpatient OT services.            [facility] OT Assessment - 02/12/16 1007      Coordination   Right 9 Hole Peg Test 27     Hand Function    Right Hand Grip (lbs) 67   Right Hand Lateral Pinch 16 lbs   Right Hand 3 Point Pinch 12 lbs     RUE strength: 5/5   OT TREATMENT    Measurements were obtained for discharge  Therapeutic Exercise:  Pt. Worked on the [employer] for 8 min. Total forward and reverse  Motion every 4 min. Constant monitoring of the right hand was provided throughout the duration.  Selfcare:  Pt. worked on Media planner with his right hand for 3 sentences with 100$ legibility with printing with adequate speed. Pt. Worked on buttoning the top collar button of a dress shirt. Pt. Had difficulty manipulating the button with the button slot horizontal instead on vertical. Pt. Education was provided about various buttonhooks. Pt. Had difficulty using them secondary to the angle of the button slot on this shirt.                      OT Education - 02/12/16 1009    Education provided Yes             OT Long Term Goals - 02/12/16 1224      OT LONG TERM GOAL #1   Title Pt.  will be independent buttoning the small buttons on the collar of a dress shirt   Baseline Pt. is unable   Time 12   Period Weeks   Status Partially Met     OT LONG TERM GOAL #2   Title Pt. will improve coordination by 5 sec. to be able to grasp medication   Baseline 41 sec.   Time 12   Period Weeks   Status Achieved     OT LONG TERM GOAL #3   Title Pt. will write 3 sentences with 100% legibility.   Baseline Pt. abler to sign signature with 75% legibly   Time 12   Period Weeks   Status Achieved     OT LONG TERM GOAL #4   Title Pt. will increase grip strength to be able to cut/slice watermelon.   Baseline Pt. has difficulty cutting watermelon. Grip strength: 54#   Time 12   Period Weeks   Status Achieved               Plan - 02/12/16 1010    Clinical Impression Statement Pt. has made excellent progress overall with his Right UE functioning, UE strength, Pinch strength, fine motor coordination  for use during ADLs and IADLs. Pt. is now ready for discharge from skilled OT services. Pt. has all HEPs in place.   Rehab Potential Good   Clinical Impairments Affecting Rehab Potential Positive indicators: age, motivation, family support. Negative Indicators: multiple comorbidities   OT Frequency 2x / week   OT Duration 12 weeks   OT Treatment/Interventions Self-care/ADL training;Neuromuscular education;Therapeutic exercise;DME and/or AE instruction;Patient/family education;Therapeutic exercises;Therapeutic activities      Patient will benefit from skilled therapeutic intervention in order to improve the following deficits and impairments:     Visit Diagnosis: Muscle weakness (generalized)  Other lack of coordination    Problem List Patient Active Problem List   Diagnosis Date Noted  . Stroke (cerebrum) ([place]) 12/20/2015  . HTN (hypertension) 12/20/2015  . GERD (gastroesophageal reflux disease) 12/20/2015  . Depression 12/20/2015   Harrel Carina, MS, OTR/L  Harrel Carina 02/12/2016, 12:40 PM  [place] MAIN [facility] SERVICES [address] [place], [place], [address] Phone: [phone]   Fax:  [phone]  Name: Fred SERPA Sr. MRN: [id number] Date of Birth: 1931-02-15

## 2016-02-16 ENCOUNTER — Ambulatory Visit: Payer: PPO

## 2016-02-16 ENCOUNTER — Encounter: Payer: PPO | Admitting: Occupational Therapy

## 2016-02-18 ENCOUNTER — Encounter: Payer: PPO | Admitting: Occupational Therapy

## 2016-02-18 ENCOUNTER — Ambulatory Visit: Payer: PPO

## 2016-02-18 DIAGNOSIS — G4733 Obstructive sleep apnea (adult) (pediatric): Secondary | ICD-10-CM | POA: Diagnosis not present

## 2016-02-23 ENCOUNTER — Ambulatory Visit: Payer: PPO

## 2016-02-23 ENCOUNTER — Encounter: Payer: PPO | Admitting: Occupational Therapy

## 2016-02-23 DIAGNOSIS — G4733 Obstructive sleep apnea (adult) (pediatric): Secondary | ICD-10-CM | POA: Diagnosis not present

## 2016-02-25 ENCOUNTER — Ambulatory Visit: Payer: PPO

## 2016-02-25 ENCOUNTER — Encounter: Payer: PPO | Admitting: Occupational Therapy

## 2016-02-27 DIAGNOSIS — I63422 Cerebral infarction due to embolism of left anterior cerebral artery: Secondary | ICD-10-CM | POA: Diagnosis not present

## 2016-02-27 DIAGNOSIS — E782 Mixed hyperlipidemia: Secondary | ICD-10-CM | POA: Diagnosis not present

## 2016-02-27 DIAGNOSIS — G20 Parkinson's disease: Secondary | ICD-10-CM | POA: Diagnosis not present

## 2016-03-01 ENCOUNTER — Encounter: Payer: PPO | Admitting: Occupational Therapy

## 2016-03-01 ENCOUNTER — Ambulatory Visit: Payer: PPO

## 2016-03-03 ENCOUNTER — Encounter: Payer: PPO | Admitting: Occupational Therapy

## 2016-03-03 ENCOUNTER — Ambulatory Visit: Payer: PPO

## 2016-03-08 ENCOUNTER — Ambulatory Visit: Payer: PPO

## 2016-03-08 ENCOUNTER — Encounter: Payer: PPO | Admitting: Occupational Therapy

## 2016-03-10 ENCOUNTER — Ambulatory Visit: Payer: PPO

## 2016-03-10 ENCOUNTER — Encounter: Payer: PPO | Admitting: Occupational Therapy

## 2016-03-15 ENCOUNTER — Ambulatory Visit: Payer: PPO

## 2016-03-15 ENCOUNTER — Encounter: Payer: PPO | Admitting: Occupational Therapy

## 2016-03-17 ENCOUNTER — Encounter: Payer: PPO | Admitting: Occupational Therapy

## 2016-03-17 ENCOUNTER — Ambulatory Visit: Payer: PPO

## 2016-03-19 DIAGNOSIS — G4733 Obstructive sleep apnea (adult) (pediatric): Secondary | ICD-10-CM | POA: Diagnosis not present

## 2016-03-25 DIAGNOSIS — G4733 Obstructive sleep apnea (adult) (pediatric): Secondary | ICD-10-CM | POA: Diagnosis not present

## 2016-03-26 DIAGNOSIS — G4733 Obstructive sleep apnea (adult) (pediatric): Secondary | ICD-10-CM | POA: Diagnosis not present

## 2016-03-26 DIAGNOSIS — R4189 Other symptoms and signs involving cognitive functions and awareness: Secondary | ICD-10-CM | POA: Diagnosis not present

## 2016-03-26 DIAGNOSIS — G20 Parkinson's disease: Secondary | ICD-10-CM | POA: Diagnosis not present

## 2016-03-30 DIAGNOSIS — Z9989 Dependence on other enabling machines and devices: Secondary | ICD-10-CM | POA: Diagnosis not present

## 2016-03-30 DIAGNOSIS — I63422 Cerebral infarction due to embolism of left anterior cerebral artery: Secondary | ICD-10-CM | POA: Diagnosis not present

## 2016-03-30 DIAGNOSIS — I10 Essential (primary) hypertension: Secondary | ICD-10-CM | POA: Diagnosis not present

## 2016-03-30 DIAGNOSIS — G4733 Obstructive sleep apnea (adult) (pediatric): Secondary | ICD-10-CM | POA: Diagnosis not present

## 2016-03-30 DIAGNOSIS — E782 Mixed hyperlipidemia: Secondary | ICD-10-CM | POA: Diagnosis not present

## 2016-04-24 DIAGNOSIS — G4733 Obstructive sleep apnea (adult) (pediatric): Secondary | ICD-10-CM | POA: Diagnosis not present

## 2016-05-19 DIAGNOSIS — G4733 Obstructive sleep apnea (adult) (pediatric): Secondary | ICD-10-CM | POA: Diagnosis not present

## 2016-05-25 DIAGNOSIS — G4733 Obstructive sleep apnea (adult) (pediatric): Secondary | ICD-10-CM | POA: Diagnosis not present

## 2016-06-02 DIAGNOSIS — S299XXA Unspecified injury of thorax, initial encounter: Secondary | ICD-10-CM | POA: Diagnosis not present

## 2016-06-02 DIAGNOSIS — R0781 Pleurodynia: Secondary | ICD-10-CM | POA: Diagnosis not present

## 2016-06-21 DIAGNOSIS — G4733 Obstructive sleep apnea (adult) (pediatric): Secondary | ICD-10-CM | POA: Diagnosis not present

## 2016-06-24 DIAGNOSIS — G4733 Obstructive sleep apnea (adult) (pediatric): Secondary | ICD-10-CM | POA: Diagnosis not present

## 2016-06-29 DIAGNOSIS — I63422 Cerebral infarction due to embolism of left anterior cerebral artery: Secondary | ICD-10-CM | POA: Diagnosis not present

## 2016-06-29 DIAGNOSIS — G20 Parkinson's disease: Secondary | ICD-10-CM | POA: Diagnosis not present

## 2016-07-06 ENCOUNTER — Observation Stay
Admission: EM | Admit: 2016-07-06 | Discharge: 2016-07-08 | Disposition: A | Discharge status: Skilled Nursing Facility | Payer: PPO | Attending: Specialist | Admitting: Specialist

## 2016-07-06 ENCOUNTER — Encounter: Payer: Self-pay | Admitting: Emergency Medicine

## 2016-07-06 ENCOUNTER — Emergency Department: Payer: PPO

## 2016-07-06 DIAGNOSIS — F329 Major depressive disorder, single episode, unspecified: Secondary | ICD-10-CM | POA: Diagnosis not present

## 2016-07-06 DIAGNOSIS — E785 Hyperlipidemia, unspecified: Secondary | ICD-10-CM | POA: Insufficient documentation

## 2016-07-06 DIAGNOSIS — R05 Cough: Secondary | ICD-10-CM | POA: Diagnosis not present

## 2016-07-06 DIAGNOSIS — Z8546 Personal history of malignant neoplasm of prostate: Secondary | ICD-10-CM | POA: Insufficient documentation

## 2016-07-06 DIAGNOSIS — G20 Parkinson's disease: Principal | ICD-10-CM | POA: Insufficient documentation

## 2016-07-06 DIAGNOSIS — I10 Essential (primary) hypertension: Secondary | ICD-10-CM | POA: Diagnosis not present

## 2016-07-06 DIAGNOSIS — R296 Repeated falls: Secondary | ICD-10-CM | POA: Insufficient documentation

## 2016-07-06 DIAGNOSIS — I2510 Atherosclerotic heart disease of native coronary artery without angina pectoris: Secondary | ICD-10-CM | POA: Diagnosis not present

## 2016-07-06 DIAGNOSIS — Z79899 Other long term (current) drug therapy: Secondary | ICD-10-CM | POA: Insufficient documentation

## 2016-07-06 DIAGNOSIS — R278 Other lack of coordination: Secondary | ICD-10-CM | POA: Insufficient documentation

## 2016-07-06 DIAGNOSIS — Z7982 Long term (current) use of aspirin: Secondary | ICD-10-CM | POA: Diagnosis not present

## 2016-07-06 DIAGNOSIS — R531 Weakness: Secondary | ICD-10-CM | POA: Diagnosis not present

## 2016-07-06 DIAGNOSIS — Z7902 Long term (current) use of antithrombotics/antiplatelets: Secondary | ICD-10-CM | POA: Diagnosis not present

## 2016-07-06 DIAGNOSIS — F0280 Dementia in other diseases classified elsewhere without behavioral disturbance: Secondary | ICD-10-CM | POA: Insufficient documentation

## 2016-07-06 DIAGNOSIS — Z8673 Personal history of transient ischemic attack (TIA), and cerebral infarction without residual deficits: Secondary | ICD-10-CM | POA: Diagnosis not present

## 2016-07-06 DIAGNOSIS — Z85828 Personal history of other malignant neoplasm of skin: Secondary | ICD-10-CM | POA: Insufficient documentation

## 2016-07-06 DIAGNOSIS — M6281 Muscle weakness (generalized): Secondary | ICD-10-CM

## 2016-07-06 DIAGNOSIS — R4182 Altered mental status, unspecified: Secondary | ICD-10-CM | POA: Diagnosis not present

## 2016-07-06 DIAGNOSIS — R2681 Unsteadiness on feet: Secondary | ICD-10-CM

## 2016-07-06 LAB — CBC WITH DIFFERENTIAL/PLATELET
BASOS ABS: 0.1 10*3/uL (ref 0–0.1)
Basophils Relative: 1 %
EOS ABS: 0.4 10*3/uL (ref 0–0.7)
Eosinophils Relative: 4 %
HEMATOCRIT: 41.3 % (ref 40.0–52.0)
HEMOGLOBIN: 14 g/dL (ref 13.0–18.0)
LYMPHS PCT: 4 %
Lymphs Abs: 0.4 10*3/uL — ABNORMAL LOW (ref 1.0–3.6)
MCH: 30.5 pg (ref 26.0–34.0)
MCHC: 34 g/dL (ref 32.0–36.0)
MCV: 89.7 fL (ref 80.0–100.0)
MONOS PCT: 7 %
Monocytes Absolute: 0.8 10*3/uL (ref 0.2–1.0)
NEUTROS PCT: 84 %
Neutro Abs: 9 10*3/uL — ABNORMAL HIGH (ref 1.4–6.5)
Platelets: 249 10*3/uL (ref 150–440)
RBC: 4.6 MIL/uL (ref 4.40–5.90)
RDW: 15.9 % — ABNORMAL HIGH (ref 11.5–14.5)
WBC: 10.7 10*3/uL — AB (ref 3.8–10.6)

## 2016-07-06 LAB — URINALYSIS, COMPLETE (UACMP) WITH MICROSCOPIC
Bacteria, UA: NONE SEEN
Bilirubin Urine: NEGATIVE
GLUCOSE, UA: NEGATIVE mg/dL
HGB URINE DIPSTICK: NEGATIVE
Ketones, ur: 5 mg/dL — AB
Leukocytes, UA: NEGATIVE
NITRITE: NEGATIVE
PH: 6 (ref 5.0–8.0)
PROTEIN: NEGATIVE mg/dL
Specific Gravity, Urine: 1.016 (ref 1.005–1.030)
Squamous Epithelial / LPF: NONE SEEN
WBC UA: NONE SEEN WBC/hpf (ref 0–5)

## 2016-07-06 LAB — BASIC METABOLIC PANEL
ANION GAP: 5 (ref 5–15)
BUN: 17 mg/dL (ref 6–20)
CALCIUM: 10.1 mg/dL (ref 8.9–10.3)
CHLORIDE: 105 mmol/L (ref 101–111)
CO2: 26 mmol/L (ref 22–32)
Creatinine, Ser: 1.1 mg/dL (ref 0.61–1.24)
GFR calc non Af Amer: >60 mL/min (ref >60)
Glucose, Bld: 113 mg/dL — ABNORMAL HIGH (ref 65–99)
Potassium: 3.8 mmol/L (ref 3.5–5.1)
SODIUM: 136 mmol/L (ref 135–145)

## 2016-07-06 LAB — TROPONIN I

## 2016-07-06 MED ORDER — LEVOFLOXACIN 750 MG PO TABS: 750.0000 mg | ORAL_TABLET | Freq: Every day | ORAL | 0 refills | Status: DC

## 2016-07-06 MED ORDER — LEVOFLOXACIN IN D5W 750 MG/150ML IV SOLN
750.0000 mg | Freq: Once | INTRAVENOUS | Status: AC
  Administered 2016-07-07: 750 mg via INTRAVENOUS
  Filled 2016-07-06: qty 150

## 2016-07-06 NOTE — ED Provider Notes (Signed)
[facility] Emergency Department Provider Note  ____________________________________________   I have reviewed the triage vital signs and the nursing notes.   HISTORY  Chief Complaint Altered Mental Status   History limited by: Not Limited   HPI Fred RASPA Sr. is a 82 y.o. male who presents to the emergency department today because of weakness and difficulty with walking. The patient started having some trouble at around 9:30am. It has been waxing and waning throughout the day. The patient did have two falls today. The patient has also been having a cough recently. The family feels he might have been hot today although they did not measure any tempuratures.    Past Medical History:  Diagnosis Date  . Arthritis    fingers, neck  . Cancer of prostate ([place])   . Cancer of skin   . Depression   . GERD (gastroesophageal reflux disease)    rare  . Hypertension    Echo s/p fall 11/15    Patient Active Problem List   Diagnosis Date Noted  . Stroke (cerebrum) ([place]) 12/20/2015  . HTN (hypertension) 12/20/2015  . GERD (gastroesophageal reflux disease) 12/20/2015  . Depression 12/20/2015    Past Surgical History:  Procedure Laterality Date  . CATARACT EXTRACTION W/PHACO Right 11/27/2014   Procedure: CATARACT EXTRACTION PHACO AND INTRAOCULAR LENS PLACEMENT (IOC);  Surgeon: Leandrew Koyanagi, MD;  Location: [place];  Service: Ophthalmology;  Laterality: Right;  . EYE SURGERY Left 09/25/14   cataract @[facility]   . HERNIA REPAIR  1999  . KIDNEY STONE SURGERY    . MELANOMA EXCISION    . PROSTATE SURGERY  2007    Prior to Admission medications   Medication Sig Start Date End Date Taking? Authorizing Provider  aspirin 81 MG tablet Take 81 mg by mouth daily.    Historical Provider, MD  atorvastatin (LIPITOR) 40 MG tablet Take 1 tablet (40 mg total) by mouth daily at 6 PM. 12/21/15   Hillary Bow, MD  carbidopa-levodopa (SINEMET IR) 25-100 MG tablet  Take 1.5 tablets by mouth 3 (three) times daily.  12/11/15   Historical Provider, MD  clopidogrel (PLAVIX) 75 MG tablet Take 1 tablet (75 mg total) by mouth daily. Patient not taking: Reported on 01/19/2016 12/22/15   Hillary Bow, MD  donepezil (ARICEPT) 5 MG tablet Take 5 mg by mouth at bedtime. 12/16/15   Historical Provider, MD  DULoxetine (CYMBALTA) 60 MG capsule Take 60 mg by mouth daily. 12/11/15   Historical Provider, MD  levofloxacin (LEVAQUIN) 750 MG tablet Take 1 tablet (750 mg total) by mouth daily. 07/06/16 07/13/16  Nance Pear, MD  losartan (COZAAR) 25 MG tablet Take 25 mg by mouth daily.     Historical Provider, MD    Allergies Patient has no known allergies.  Family History  Problem Relation Age of Onset  . Cancer    . Stroke    . Heart attack      Social History Social History  Substance Use Topics  . Smoking status: Never Smoker  . Smokeless tobacco: Not on file  . Alcohol use 3.0 oz/week    5 Glasses of wine per week    Review of Systems  Constitutional: Negative for fever. Cardiovascular: Negative for chest pain. Respiratory: Negative for shortness of breath.  Positive for cough. Gastrointestinal: Negative for abdominal pain, vomiting and diarrhea. Neurological: Negative for headaches, focal weakness or numbness.  10-point ROS otherwise negative.  ____________________________________________   PHYSICAL EXAM:  VITAL SIGNS: ED Triage  Vitals  Enc Vitals Group     BP 07/06/16 2006 124/60     Pulse Rate 07/06/16 2004 74     Resp 07/06/16 2004 16     Temp 07/06/16 2004 98.5 F (36.9 C)     Temp Source 07/06/16 2004 Oral     SpO2 07/06/16 2004 95 %     Weight 07/06/16 2006 160 lb (72.6 kg)     Height 07/06/16 2006 6' (1.829 m)     Head Circumference --      Peak Flow --      Pain Score 07/06/16 2020 0   Constitutional: Alert and oriented. Well appearing and in no distress. Eyes: Conjunctivae are normal. Normal extraocular movements. ENT    Head: Normocephalic and atraumatic.   Nose: No congestion/rhinnorhea.   Mouth/Throat: Mucous membranes are moist.   Neck: No stridor. Hematological/Lymphatic/Immunilogical: No cervical lymphadenopathy. Cardiovascular: Normal rate, regular rhythm.  No murmurs, rubs, or gallops.  Respiratory: Normal respiratory effort without tachypnea nor retractions. Breath sounds are clear and equal bilaterally. Rhonchi in right lower lung. Gastrointestinal: Soft and non tender. No rebound. No guarding.  Genitourinary: Deferred Musculoskeletal: Normal range of motion in all extremities. No lower extremity edema. Neurologic:  Normal speech and language. Face symmetric. Tongue midline. PERRL. EOMI. No pronator drift. No lower extremity drift. Sensation grossly intact. No gross focal neurologic deficits are appreciated.  Skin:  Skin is warm, dry and intact. No rash noted. Psychiatric: Mood and affect are normal. Speech and behavior are normal. Patient exhibits appropriate insight and judgment.  ____________________________________________    LABS (pertinent positives/negatives)  Labs Reviewed  CBC WITH DIFFERENTIAL/PLATELET - Abnormal; Notable for the following:       Result Value   WBC 10.7 (*)    RDW 15.9 (*)    Neutro Abs 9.0 (*)    Lymphs Abs 0.4 (*)    All other components within normal limits  BASIC METABOLIC PANEL - Abnormal; Notable for the following:    Glucose, Bld 113 (*)    All other components within normal limits  URINALYSIS, COMPLETE (UACMP) WITH MICROSCOPIC - Abnormal; Notable for the following:    Color, Urine YELLOW (*)    APPearance CLEAR (*)    Ketones, ur 5 (*)    All other components within normal limits  TROPONIN I    ____________________________________________   EKG  None  ____________________________________________    RADIOLOGY  CT head IMPRESSION:  No acute intracranial findings. There is moderate generalized  atrophy and chronic appearing white  matter hypodensities consistent  with small vessel ischemic disease.       CXR IMPRESSION:  No active cardiopulmonary disease.      ____________________________________________   PROCEDURES  Procedures  ____________________________________________   INITIAL IMPRESSION / ASSESSMENT AND PLAN / ED COURSE  Pertinent labs & imaging results that were available during my care of the patient were reviewed by me and considered in my medical decision making (see chart for details).  Patient presented to the emergency department today with concerns for intermittent weakness and difficulty with walking today. No focal neuro deficits on exam. Patient did have some rhonchi in the right lower lungs. Chest x-ray did not show any obvious pneumonia. Patient had a very mild leukocytosis. Nurses were able to get patient up and he did ambulate without any undue difficulty. Family stated it did appear that it was better than what they were noticing at home. This point I had a discussion with the patient's family.  They did ask admitted. I do not see clear indication for admission at this time. I doubt CVA given the intermittent nature throughout the day. No focal neuro deficits. Possible he has a slight pneumonia given rhonchi on exam and very mild leukocytosis. He has been having a cough. Will plan on treating at this point for pneumonia although again no evidence of pneumonia on chest x-ray. We will encourage patient and family to contact primary care tomorrow morning. Patient and family were called with plan of going home.  ____________________________________________   FINAL CLINICAL IMPRESSION(S) / ED DIAGNOSES  Final diagnoses:  Weakness     Note: This dictation was prepared with Dragon dictation. Any transcriptional errors that result from this process are unintentional     Nance Pear, MD 07/07/16 0006

## 2016-07-06 NOTE — ED Triage Notes (Addendum)
Pt in family states since 59 he is not able to walk and seems confused, hx of strokes in June. Family concerned he might have had a stroke. Pt has fallen x 2 today with no injuries at this time.

## 2016-07-06 NOTE — ED Notes (Addendum)
Pt ambulated from room 14 to subwait. Pt's steps were short but steady. Pt used RN and tech's hands as support from room to subwait and back to door. Pt was able to ambulate independently from door of room to bed without using tech and RN's hands for support.

## 2016-07-06 NOTE — Discharge Instructions (Signed)
Please seek medical attention for any high fevers, chest pain, shortness of breath, change in behavior, persistent vomiting, bloody stool or any other new or concerning symptoms.  

## 2016-07-06 NOTE — ED Notes (Addendum)
Pt family reports that he got to the point he could not move his legs at approx 6pm - he is having difficulty standing and walking - normally he is able to stand and ambulate unassisted - pt had two falls today but with neither fall did he hit his head - pt had stroke June 3rd - pt was able to stand and rotate to get into bed - he was also able to lift his own legs and turn around in the bed - spouse reports that pt started with a cough 4 days ago - cough is nonproductive - reports runny nose but family states this is normal for pt - spouse has appt in am with PCP to have cough evaluated - family states pt is confused today - pt states he agrees that he he is having difficulty walking but otherwise he states he is fine and not in any pain

## 2016-07-06 NOTE — ED Notes (Signed)
ED Provider at bedside talking to family. Pt and family concerned about going home. MD requesting to walk the patient.

## 2016-07-06 NOTE — ED Notes (Signed)
Pt given sandwich tray 

## 2016-07-07 ENCOUNTER — Encounter: Payer: Self-pay | Admitting: Internal Medicine

## 2016-07-07 DIAGNOSIS — I10 Essential (primary) hypertension: Secondary | ICD-10-CM | POA: Diagnosis not present

## 2016-07-07 DIAGNOSIS — Z8673 Personal history of transient ischemic attack (TIA), and cerebral infarction without residual deficits: Secondary | ICD-10-CM | POA: Diagnosis not present

## 2016-07-07 DIAGNOSIS — W19XXXA Unspecified fall, initial encounter: Secondary | ICD-10-CM | POA: Diagnosis not present

## 2016-07-07 DIAGNOSIS — R531 Weakness: Secondary | ICD-10-CM

## 2016-07-07 LAB — TSH: TSH: 3.631 u[IU]/mL (ref 0.350–4.500)

## 2016-07-07 MED ORDER — ONDANSETRON HCL 4 MG/2ML IJ SOLN: 4.0000 mg | Freq: Four times a day (QID) | INTRAMUSCULAR | Status: DC | PRN

## 2016-07-07 MED ORDER — DONEPEZIL HCL 5 MG PO TABS
5.0000 mg | ORAL_TABLET | Freq: Every day | ORAL | Status: DC
  Administered 2016-07-07: 5 mg via ORAL
  Filled 2016-07-07: qty 1

## 2016-07-07 MED ORDER — CLOPIDOGREL BISULFATE 75 MG PO TABS
75.0000 mg | ORAL_TABLET | Freq: Every day | ORAL | Status: DC
  Administered 2016-07-07 – 2016-07-08 (×2): 75 mg via ORAL
  Filled 2016-07-07 (×2): qty 1

## 2016-07-07 MED ORDER — CARBIDOPA-LEVODOPA 25-250 MG PO TABS
2.0000 | ORAL_TABLET | Freq: Three times a day (TID) | ORAL | Status: DC
  Administered 2016-07-07: 2 via ORAL
  Filled 2016-07-07 (×3): qty 2

## 2016-07-07 MED ORDER — ACETAMINOPHEN 325 MG PO TABS: 650.0000 mg | ORAL_TABLET | Freq: Four times a day (QID) | ORAL | Status: DC | PRN

## 2016-07-07 MED ORDER — ATORVASTATIN CALCIUM 10 MG PO TABS
5.0000 mg | ORAL_TABLET | Freq: Every day | ORAL | Status: DC
  Administered 2016-07-07: 5 mg via ORAL
  Filled 2016-07-07 (×2): qty 1

## 2016-07-07 MED ORDER — ONDANSETRON HCL 4 MG PO TABS: 4.0000 mg | ORAL_TABLET | Freq: Four times a day (QID) | ORAL | Status: DC | PRN

## 2016-07-07 MED ORDER — CARBIDOPA-LEVODOPA 25-250 MG PO TABS
1.0000 | ORAL_TABLET | Freq: Three times a day (TID) | ORAL | Status: DC
  Filled 2016-07-07: qty 1

## 2016-07-07 MED ORDER — CARBIDOPA-LEVODOPA 25-250 MG PO TABS
1.0000 | ORAL_TABLET | Freq: Three times a day (TID) | ORAL | Status: DC
  Administered 2016-07-07 – 2016-07-08 (×3): 1 via ORAL
  Filled 2016-07-07 (×3): qty 1

## 2016-07-07 MED ORDER — ACETAMINOPHEN 650 MG RE SUPP: 650.0000 mg | Freq: Four times a day (QID) | RECTAL | Status: DC | PRN

## 2016-07-07 MED ORDER — DULOXETINE HCL 30 MG PO CPEP
30.0000 mg | ORAL_CAPSULE | Freq: Every day | ORAL | Status: DC
  Administered 2016-07-07 – 2016-07-08 (×2): 30 mg via ORAL
  Filled 2016-07-07 (×2): qty 1

## 2016-07-07 MED ORDER — DOCUSATE SODIUM 100 MG PO CAPS
100.0000 mg | ORAL_CAPSULE | Freq: Two times a day (BID) | ORAL | Status: DC
  Administered 2016-07-07 (×2): 100 mg via ORAL
  Filled 2016-07-07 (×3): qty 1

## 2016-07-07 NOTE — Care Management Obs Status (Signed)
[place] NOTIFICATION   Patient Details  Name: Fred MCLAUCHLAN Sr. MRN: [id number] Date of Birth: [birth year]   Medicare Observation Status Notification Given:  Yes    Jolly Mango, RN 07/07/2016, 3:43 PM

## 2016-07-07 NOTE — Evaluation (Signed)
Occupational Therapy Evaluation Patient Details Name: Fred Jones Sr. MRN: [id number] DOB: 1930/08/22 Today's Date: 07/07/2016    History of Present Illness Pt. is an 82 y.o. male who was admitted to [facility] with multiple falls. Pt. presented to the ER with tachypnea, and slightly elevated blood count. Pt. Has had CVA within the past 6 months.   Clinical Impression   Pt. Is an 82 y.o. Male who was admitted to [facility] with multiple falls. Pt. Presents with weakness, impaired cognition, lethargy/fatigue, decreased activity tolerance, and impaired functional mobility which hinder his ability to complete ADL functioning. Pt. Could benefit from skilled OT services for ADL training, there. Ex., neuromuscular re-ed, and pt./family education about home modification, and DME. Pt. Would benefit from SNF level of care, with follow-up OT services to improve ADL functioning, and facilitate return to PLOF.    Follow Up Recommendations  SNF    Equipment Recommendations       Recommendations for Other Services       Precautions / Restrictions Precautions Precautions: Fall Restrictions Weight Bearing Restrictions: No              ADL Overall ADL's : Needs assistance/impaired     Grooming:  (Pt. requires increased time and cues.)                                 General ADL Comments: Pt. requires extensive assist with LE ADL tasks.      Vision     Perception     Praxis      Pertinent Vitals/Pain Pain Assessment: No/denies pain     Hand Dominance Right   Extremity/Trunk Assessment Upper Extremity Assessment Upper Extremity Assessment: Generalized weakness RUE Deficits / Details: History of an old rotator cuff injury in RUE per pt. wife report. Grip strength: Right 30#, Left 38#       Communication Communication Communication: No difficulties   Cognition Arousal/Alertness: Lethargic Behavior During Therapy: Flat affect Overall Cognitive Status:  Impaired/Different from baseline Area of Impairment: Orientation Orientation Level: Disoriented to;Time             General Comments: Pt. was lethargic during the session. Intermittently closing eyes, and drifting to sleep.   General Comments       Exercises       Shoulder Instructions      Home Living Family/patient expects to be discharged to:: Private residence Living Arrangements: Spouse/significant other Available Help at Discharge: Family Type of Home: House Home Access: Level entry     Home Layout: Two level     Bathroom Shower/Tub: Occupational psychologist: [place]: Towns - built in;Walker - 2 wheels;Cane - single point          Prior Functioning/Environment Level of Independence: Independent        Comments: Pt was independent prior to CVA in June 2017. Family currently reports frequent fluctuations in mobility at home. Son states that sometimes he appears to walk well and others he is very unsteady. Pt denies any syncopal episodes        OT Problem List: Decreased strength;Decreased range of motion;Decreased coordination;Decreased safety awareness;Pain;Decreased knowledge of use of DME or AE   OT Treatment/Interventions: Self-care/ADL training;Therapeutic exercise;Therapeutic activities;Visual/perceptual remediation/compensation;DME and/or AE instruction;Patient/family education;Neuromuscular education    OT Goals(Current goals can be found in the care plan section) Acute Rehab OT Goals Patient Stated  Goal: To regain independence OT Goal Formulation: With patient Potential to Achieve Goals: Good  OT Frequency: Min 1X/week   Barriers to D/C:            Co-evaluation              End of Session    Activity Tolerance: Patient limited by fatigue;Patient limited by lethargy Patient left: in bed;with call bell/phone within reach;with bed alarm set;with family/visitor present   Time: 1445-1455 OT Time  Calculation (min): 10 min Charges:  OT General Charges $OT Visit: 1 Procedure OT Evaluation $OT Eval Low Complexity: 1 Procedure G-Codes: OT G-codes **NOT FOR INPATIENT CLASS** Functional Assessment Tool Used: Clinical judgement based on pt. current functional status Functional Limitation: Self care Self Care Current Status [id number]): At least 60 percent but less than 80 percent impaired, limited or restricted Self Care Goal Status [id number]): At least 20 percent but less than 40 percent impaired, limited or restricted  Harrel Carina, MS, OTR/L 07/07/2016, 5:14 PM

## 2016-07-07 NOTE — ED Notes (Signed)
Pt brief was changed. Pt attempted to use urinal with staff assistance but was unsuccessful

## 2016-07-07 NOTE — NC FL2 (Signed)
[place] LEVEL OF CARE SCREENING TOOL     IDENTIFICATION  Patient Name: Fred Jones Sr. Birthdate: [birth year] Sex: male Admission Date (Current Location): 07/06/2016  [place] and [place] Number:  Engineering geologist and Address:  [facility], [address], [place], [place] [address]      Provider Number: [id number]  Attending Physician Name and Address:  Henreitta Leber, MD  Relative Name and Phone Number:       Current Level of Care: Hospital Recommended Level of Care: [place] Prior Approval Number:    Date Approved/Denied:   PASRR Number:  ([id number])  Discharge Plan: SNF    Current Diagnoses: Patient Active Problem List   Diagnosis Date Noted  . Weakness 07/07/2016  . Stroke (cerebrum) ([place]) 12/20/2015  . HTN (hypertension) 12/20/2015  . GERD (gastroesophageal reflux disease) 12/20/2015  . Depression 12/20/2015    Orientation RESPIRATION BLADDER Height & Weight     Self, Time, Situation, Place  Normal Incontinent Weight: 160 lb (72.6 kg) Height:  6' (182.9 cm)  BEHAVIORAL SYMPTOMS/MOOD NEUROLOGICAL BOWEL NUTRITION STATUS   (none)  (none) Continent Diet (Regular Diet )  AMBULATORY STATUS COMMUNICATION OF NEEDS Skin   Extensive Assist Verbally Normal                       Personal Care Assistance Level of Assistance  Bathing, Feeding, Dressing Bathing Assistance: Limited assistance Feeding assistance: Independent Dressing Assistance: Limited assistance     Functional Limitations Info  Sight, Hearing, Speech Sight Info: Adequate Hearing Info: Adequate Speech Info: Adequate    SPECIAL CARE FACTORS FREQUENCY  PT (By licensed PT), OT (By licensed OT)     PT Frequency:  (5) OT Frequency:  (5)            Contractures      Additional Factors Info  Code Status, Allergies Code Status Info:  (Full Code. ) Allergies Info:  (No Known Allergies. )           Current  Medications (07/07/2016):  This is the current hospital active medication list Current Facility-Administered Medications  Medication Dose Route Frequency Provider Last Rate Last Dose  . acetaminophen (TYLENOL) tablet 650 mg  650 mg Oral Q6H PRN Harrie Foreman, MD       Or  . acetaminophen (TYLENOL) suppository 650 mg  650 mg Rectal Q6H PRN Harrie Foreman, MD      . atorvastatin (LIPITOR) tablet 5 mg  5 mg Oral Daily Harrie Foreman, MD      . carbidopa-levodopa (SINEMET IR) 25-250 MG per tablet immediate release 1 tablet  1 tablet Oral TID Henreitta Leber, MD      . clopidogrel (PLAVIX) tablet 75 mg  75 mg Oral Daily Harrie Foreman, MD   75 mg at 07/07/16 0844  . docusate sodium (COLACE) capsule 100 mg  100 mg Oral BID Harrie Foreman, MD   100 mg at 07/07/16 0844  . donepezil (ARICEPT) tablet 5 mg  5 mg Oral QHS Harrie Foreman, MD      . DULoxetine (CYMBALTA) DR capsule 30 mg  30 mg Oral Daily Harrie Foreman, MD   30 mg at 07/07/16 0845  . ondansetron (ZOFRAN) tablet 4 mg  4 mg Oral Q6H PRN Harrie Foreman, MD       Or  . ondansetron [facility]) injection 4 mg  4 mg Intravenous Q6H PRN Legrand Como  Tinnie Gens, MD         Discharge Medications: Please see discharge summary for a list of discharge medications.  Relevant Imaging Results:  Relevant Lab Results:   Additional Information  (SSN: [id number])  Pauleen Goleman, [place] Beets, LCSW

## 2016-07-07 NOTE — H&P (Signed)
Fred CHANCELLOR Sr. is an 82 y.o. male.   Chief Complaint: Falls HPI: The patient with past medical history of recent stroke presents emergency department after falling. His family states that he has not seems to be quite himself mentally. The patient admits that his memory is also not what it used to be. Today he felt twice but did not hit his head. Last week however he fell and sustained a contusion to his left orbit. Reportedly his functional status is no different since that time. In the emergency department the patient had some intermittent tachypnea and barely elevated white blood cell count. Though he never had hypoxia emergency department considered pneumonia as a potential source of weakness and gave the patient Levaquin. The patient denies shortness of breath or chest pain but admits to a faint nonproductive cough for the last few days. Plan was for discharge home when the patient demonstrated that he was too weak to walk which prompted the emergency department staff to call hospitalist service for admission.  Past Medical History:  Diagnosis Date  . Arthritis    fingers, neck  . Cancer of prostate ([place])   . Cancer of skin   . Depression   . GERD (gastroesophageal reflux disease)    rare  . Hypertension    Echo s/p fall 11/15    Past Surgical History:  Procedure Laterality Date  . CATARACT EXTRACTION W/PHACO Right 11/27/2014   Procedure: CATARACT EXTRACTION PHACO AND INTRAOCULAR LENS PLACEMENT (IOC);  Surgeon: Leandrew Koyanagi, MD;  Location: [facility];  Service: Ophthalmology;  Laterality: Right;  . EYE SURGERY Left 09/25/14   cataract @[facility]   . HERNIA REPAIR  1999  . KIDNEY STONE SURGERY    . MELANOMA EXCISION    . PROSTATE SURGERY  2007    Family History  Problem Relation Age of Onset  . Cancer    . Stroke    . Heart attack    . Stroke Mother   . Stroke Father    Social History:  reports that he has never smoked. He does not have any smokeless tobacco history  on file. He reports that he drinks about 3.0 oz of alcohol per week . He reports that he does not use drugs.  Allergies: No Known Allergies  Prior to Admission medications   Medication Sig Start Date End Date Taking? Authorizing Provider  atorvastatin (LIPITOR) 10 MG tablet Take 5 mg by mouth daily.   Yes Historical Provider, MD  carbidopa-levodopa (SINEMET IR) 25-250 MG tablet Take 1 tablet by mouth 3 (three) times daily. 06/14/16  Yes Historical Provider, MD  clopidogrel (PLAVIX) 75 MG tablet Take 1 tablet (75 mg total) by mouth daily. 12/22/15  Yes Srikar Sudini, MD  donepezil (ARICEPT) 5 MG tablet Take 5 mg by mouth at bedtime. 12/16/15  Yes Historical Provider, MD  DULoxetine (CYMBALTA) 30 MG capsule Take 30 mg by mouth daily. 07/05/16  Yes Historical Provider, MD  levofloxacin (LEVAQUIN) 750 MG tablet Take 1 tablet (750 mg total) by mouth daily. 07/06/16 07/13/16  Nance Pear, MD     Results for orders placed or performed during the hospital encounter of 07/06/16 (from the past 48 hour(s))  CBC with Differential     Status: Abnormal   Collection Time: 07/06/16  8:27 PM  Result Value Ref Range   WBC 10.7 (H) 3.8 - 10.6 K/uL   RBC 4.60 4.40 - 5.90 MIL/uL   Hemoglobin 14.0 13.0 - 18.0 g/dL   HCT 41.3 40.0 -  52.0 %   MCV 89.7 80.0 - 100.0 fL   MCH 30.5 26.0 - 34.0 pg   MCHC 34.0 32.0 - 36.0 g/dL   RDW 15.9 (H) 11.5 - 14.5 %   Platelets 249 150 - 440 K/uL   Neutrophils Relative % 84 %   Lymphocytes Relative 4 %   Monocytes Relative 7 %   Eosinophils Relative 4 %   Basophils Relative 1 %   Neutro Abs 9.0 (H) 1.4 - 6.5 K/uL   Lymphs Abs 0.4 (L) 1.0 - 3.6 K/uL   Monocytes Absolute 0.8 0.2 - 1.0 K/uL   Eosinophils Absolute 0.4 0 - 0.7 K/uL   Basophils Absolute 0.1 0 - 0.1 K/uL   RBC Morphology ELLIPTOCYTES   Basic metabolic panel     Status: Abnormal   Collection Time: 07/06/16  8:27 PM  Result Value Ref Range   Sodium 136 135 - 145 mmol/L   Potassium 3.8 3.5 - 5.1 mmol/L    Chloride 105 101 - 111 mmol/L   CO2 26 22 - 32 mmol/L   Glucose, Bld 113 (H) 65 - 99 mg/dL   BUN 17 6 - 20 mg/dL   Creatinine, Ser 1.10 0.61 - 1.24 mg/dL   Calcium 10.1 8.9 - 10.3 mg/dL   GFR calc non Af Amer >60 >60 mL/min   GFR calc Af Amer >60 >60 mL/min    Comment: (NOTE) The eGFR has been calculated using the CKD EPI equation. This calculation has not been validated in all clinical situations. eGFR's persistently <60 mL/min signify possible Chronic Kidney Disease.    Anion gap 5 5 - 15  Troponin I     Status: None   Collection Time: 07/06/16  8:27 PM  Result Value Ref Range   Troponin I <0.03 <0.03 ng/mL  Urinalysis, Complete w Microscopic     Status: Abnormal   Collection Time: 07/06/16  8:45 PM  Result Value Ref Range   Color, Urine YELLOW (A) YELLOW   APPearance CLEAR (A) CLEAR   Specific Gravity, Urine 1.016 1.005 - 1.030   pH 6.0 5.0 - 8.0   Glucose, UA NEGATIVE NEGATIVE mg/dL   Hgb urine dipstick NEGATIVE NEGATIVE   Bilirubin Urine NEGATIVE NEGATIVE   Ketones, ur 5 (A) NEGATIVE mg/dL   Protein, ur NEGATIVE NEGATIVE mg/dL   Nitrite NEGATIVE NEGATIVE   Leukocytes, UA NEGATIVE NEGATIVE   RBC / HPF 6-30 0 - 5 RBC/hpf   WBC, UA NONE SEEN 0 - 5 WBC/hpf   Bacteria, UA NONE SEEN NONE SEEN   Squamous Epithelial / LPF NONE SEEN NONE SEEN   Ca Oxalate Crys, UA PRESENT    Dg Chest 2 View  Result Date: 07/06/2016 CLINICAL DATA:  82 year old male with altered mental status and weakness. Cough. EXAM: CHEST  2 VIEW COMPARISON:  Chest radiograph dated 02/10/2009 FINDINGS: The lungs are clear. There is no pleural effusion or pneumothorax. The cardiac silhouette is within normal limits. There is mild atherosclerotic calcification of the aortic arch. No acute osseous pathology. IMPRESSION: No active cardiopulmonary disease. Electronically Signed   By: Anner Crete M.D.   On: 07/06/2016 21:27   Ct Head Wo Contrast  Result Date: 07/06/2016 CLINICAL DATA:  Altered mental  status. Difficulty walking today, since 18:00. EXAM: CT HEAD WITHOUT CONTRAST TECHNIQUE: Contiguous axial images were obtained from the base of the skull through the vertex without intravenous contrast. COMPARISON:  12/21/2015 FINDINGS: Brain: There is no intracranial hemorrhage, mass or evidence of acute infarction. There  is moderate generalized atrophy. There is moderate chronic microvascular ischemic change. There is no significant extra-axial fluid collection. No acute intracranial findings are evident. Vascular: No hyperdense vessel or unexpected calcification. Skull: Normal. Negative for fracture or focal lesion. Sinuses/Orbits: No acute finding. Other: None. IMPRESSION: No acute intracranial findings. There is moderate generalized atrophy and chronic appearing white matter hypodensities consistent with small vessel ischemic disease. Electronically Signed   By: Andreas Newport M.D.   On: 07/06/2016 21:21    Review of Systems  Constitutional: Negative for chills and fever.  HENT: Negative for sore throat and tinnitus.   Eyes: Negative for blurred vision and redness.  Respiratory: Negative for cough and shortness of breath.   Cardiovascular: Negative for chest pain, palpitations, orthopnea and PND.  Gastrointestinal: Negative for abdominal pain, diarrhea, nausea and vomiting.  Genitourinary: Negative for dysuria, frequency and urgency.  Musculoskeletal: Positive for falls. Negative for joint pain and myalgias.  Skin: Negative for rash.       No lesions  Neurological: Negative for speech change, focal weakness and weakness.  Endo/Heme/Allergies: Does not bruise/bleed easily.       No temperature intolerance  Psychiatric/Behavioral: Negative for depression and suicidal ideas.    Blood pressure (!) 150/73, pulse 62, temperature 98.8 F (37.1 C), temperature source Oral, resp. rate 18, height 6' (1.829 m), weight 72.6 kg (160 lb), SpO2 97 %. Physical Exam  Vitals reviewed. Constitutional:  He is oriented to person, place, and time. He appears well-developed and well-nourished. No distress.  HENT:  Head: Normocephalic.    Mouth/Throat: Oropharynx is clear and moist.  Eyes: Conjunctivae and EOM are normal. Pupils are equal, round, and reactive to light. No scleral icterus.  Neck: Normal range of motion. Neck supple. No JVD present. No tracheal deviation present. No thyromegaly present.  Cardiovascular: Normal rate and normal heart sounds.  Exam reveals no gallop and no friction rub.   No murmur heard. Respiratory: Effort normal and breath sounds normal. No respiratory distress.  GI: Soft. Bowel sounds are normal. He exhibits no distension. There is no tenderness.  Genitourinary:  Genitourinary Comments: Deferred  Musculoskeletal: Normal range of motion. He exhibits no edema.  Lymphadenopathy:    He has no cervical adenopathy.  Neurological: He is alert and oriented to person, place, and time. No cranial nerve deficit. He displays a negative Romberg sign. Coordination abnormal.  Dysmetria L>R; some dysdiadochokinesis   Skin: Skin is warm and dry. No rash noted. No erythema.  Psychiatric: He has a normal mood and affect. His behavior is normal. Judgment and thought content normal.     Assessment/Plan This is an 82 year old male admitted for generalized weakness. 1. Weakness: Secondary to deconditioning and impaired coordination following remote CVA. The patient has dysmetria as well as difficulty initiating rapid movements. Undoubtably this makes it difficult for him to be sure footed at home. He has undergone some physical therapy but will require more. PT/OT eval and treatment prior to discharge. In my opinion, there is no evidence of pneumonia. I have not continued antibiotics. 2. Parkinsonian tremor: This may also contribute to the patient's imbalance or uncoordination. I have increased his dose of Sinemet. 3. Cardiovascular disease: Continue secondary prevention with  Plavix and Lipitor. 4. Cognitive impairment: May also contribute to falls. Continue Aricept 5. DVT prophylaxis: Early ambulation 6. GI prophylaxis: None The patient is a full code. Time spent on admission orders and patient care approximate 45 minutes  Harrie Foreman, MD 07/07/2016, 3:30 AM

## 2016-07-07 NOTE — Progress Notes (Signed)
[facility] [place] NAME: Fred Jones    MR#:  [id number]  DATE OF BIRTH:  12-28-1930  SUBJECTIVE:   Pt. Here due to weakness and recurrent falls.  No other acute events overnight.  No complaints presently.   REVIEW OF SYSTEMS:    Review of Systems  Unable to perform ROS: Mental acuity    Nutrition: Regular Tolerating Diet: Yes Tolerating PT: Await Eval.    DRUG ALLERGIES:  No Known Allergies  VITALS:  Blood pressure (!) 141/62, pulse (!) 59, temperature 98.7 F (37.1 C), temperature source Oral, resp. rate 16, height 6' (1.829 m), weight 72.6 kg (160 lb), SpO2 100 %.  PHYSICAL EXAMINATION:   Physical Exam  GENERAL:  82 y.o.-year-old patient lying in the bed in no acute distress.  EYES: Pupils equal, round, reactive to light and accommodation. No scleral icterus. Extraocular muscles intact.  HEENT: Head atraumatic, normocephalic. Oropharynx and nasopharynx clear.  NECK:  Supple, no jugular venous distention. No thyroid enlargement, no tenderness.  LUNGS: Normal breath sounds bilaterally, no wheezing, rales, rhonchi. No use of accessory muscles of respiration.  CARDIOVASCULAR: S1, S2 normal. No murmurs, rubs, or gallops.  ABDOMEN: Soft, nontender, nondistended. Bowel sounds present. No organomegaly or mass.  EXTREMITIES: No cyanosis, clubbing or edema b/l.    NEUROLOGIC: Cranial nerves II through XII are intact. No focal Motor or sensory deficits b/l. No tremor   PSYCHIATRIC: The patient is alert and oriented x 1.  SKIN: No obvious rash, lesion, or ulcer.    LABORATORY PANEL:   CBC  Recent Labs Lab 07/06/16 2027  WBC 10.7*  HGB 14.0  HCT 41.3  PLT 249   ------------------------------------------------------------------------------------------------------------------  Chemistries   Recent Labs Lab 07/06/16 2027  NA 136  K 3.8  CL 105  CO2 26  GLUCOSE 113*  BUN 17  CREATININE 1.10  CALCIUM 10.1    ------------------------------------------------------------------------------------------------------------------  Cardiac Enzymes  Recent Labs Lab 07/06/16 2027  TROPONINI <0.03   ------------------------------------------------------------------------------------------------------------------  RADIOLOGY:  Dg Chest 2 View  Result Date: 07/06/2016 CLINICAL DATA:  82 year old male with altered mental status and weakness. Cough. EXAM: CHEST  2 VIEW COMPARISON:  Chest radiograph dated 02/10/2009 FINDINGS: The lungs are clear. There is no pleural effusion or pneumothorax. The cardiac silhouette is within normal limits. There is mild atherosclerotic calcification of the aortic arch. No acute osseous pathology. IMPRESSION: No active cardiopulmonary disease. Electronically Signed   By: Anner Crete M.D.   On: 07/06/2016 21:27   Ct Head Wo Contrast  Result Date: 07/06/2016 CLINICAL DATA:  Altered mental status. Difficulty walking today, since 18:00. EXAM: CT HEAD WITHOUT CONTRAST TECHNIQUE: Contiguous axial images were obtained from the base of the skull through the vertex without intravenous contrast. COMPARISON:  12/21/2015 FINDINGS: Brain: There is no intracranial hemorrhage, mass or evidence of acute infarction. There is moderate generalized atrophy. There is moderate chronic microvascular ischemic change. There is no significant extra-axial fluid collection. No acute intracranial findings are evident. Vascular: No hyperdense vessel or unexpected calcification. Skull: Normal. Negative for fracture or focal lesion. Sinuses/Orbits: No acute finding. Other: None. IMPRESSION: No acute intracranial findings. There is moderate generalized atrophy and chronic appearing white matter hypodensities consistent with small vessel ischemic disease. Electronically Signed   By: Andreas Newport M.D.   On: 07/06/2016 21:21     ASSESSMENT AND PLAN:   82 year old male with past medical history of  Parkinson's, dementia, hypertension, GERD, depression, history of prostate cancer who  presented to the hospital due to recurrent falls and weakness.  1. Recurrent falls and weakness-etiology unclear but suspected due to his underlying Parkinson's disease. CT head on admission showed no evidence of acute neurologic abnormality. -Await further physical therapy evaluation.  2. History of Parkinson's disease/dementia-continue Sinemet, Aricept.  3. Depression- cont. Cymbalta.  4. Hyperlipidemia-continue atorvastatin  All the records are reviewed and case discussed with Care Management/Social Worker. Management plans discussed with the patient, family and they are in agreement.  CODE STATUS: Full code  DVT Prophylaxis: Ted's & SCD's.   TOTAL TIME TAKING CARE OF THIS PATIENT: 30 minutes.   POSSIBLE D/C IN 1-2 DAYS, DEPENDING ON CLINICAL CONDITION.   Henreitta Leber M.D on 07/07/2016 at 3:05 PM  Between 7am to 6pm - Pager - [phone]  After 6pm go to [email or web address] - Proofreader  [facility]  Office  [phone]  CC: Primary care physician; Rusty Aus, MD

## 2016-07-07 NOTE — Evaluation (Signed)
Physical Therapy Evaluation Patient Details Name: Fred CASS Sr. MRN: [id number] DOB: 1931/02/02 Today's Date: 07/07/2016   History of Present Illness  The patient with past medical history of recent stroke presents emergency department after falling. His family states that he has not seems to be quite himself mentally. The patient admits that his memory is also not what it used to be. Today he felt twice but did not hit his head. Last week however he fell and sustained a contusion to his left orbit. Reportedly his functional status is no different since that time. In the emergency department the patient had some intermittent tachypnea and barely elevated white blood cell count. Though he never had hypoxia emergency department considered pneumonia as a potential source of weakness and gave the patient Levaquin. The patient denies shortness of breath or chest pain but admits to a faint nonproductive cough for the last few days. Plan was for discharge home when the patient demonstrated that he was too weak to walk which prompted the emergency department staff to call hospitalist service for admission.  Clinical Impression  Pt admitted with above diagnosis. Pt currently with functional limitations due to the deficits listed below (see PT Problem List).  Pt with decreased cognitive functioning on this date. He is AOx2, able to provide year but not month. At times pt is able to answer questions quickly and other times he requires extended time to respond. He demonstrates very poor static sitting balance and continues to fall posterior while sitting at EOB. Does not appear to be due to truncal weakness but has very poor awareness of upright posturing. In standing pt also pushing backwards heavily with inability to shift weight forward. He requires modA+1 to remain sitting balance and minA+2 to remain balance in standing with bilateral UE support and bed supporting knees. Pt is only able to ambulate a very  short distance due to lack of safey and poor balance. He present with some focal R shoulder flexion weakness but reports intact sensation to light touch bilaterally. Positive pronator drift on RUE and dysmetria noted with finger to nose testing. Unclear which of his deficits are residual from prior CVA 12/2015. Per CNA pt was able to ambulate with +1 assist approximately 2 hours ago. Same CNA present during assessment and reports considerable decline in mobility. Pt is scheduled to receive next Sinemet dose at 1600 and unclear if this could be impacting his current mobility state. Would be benefit to coordinate PT session with Sinemet dosing to see how it impacts his mobility. Pt is currently unsafe to return home at his current mobility level. He will need SNF placement at discharge in order facilitate safe transition home. Discussion with RN and MD regarding findings of PT evaluation. Pt will benefit from skilled PT services to address deficits in strength, balance, and mobility in order to return to full function at home.     Follow Up Recommendations SNF    Equipment Recommendations  None recommended by PT;Other (comment) (Needs to use rolling walker for ambulation)    Recommendations for Other Services       Precautions / Restrictions Precautions Precautions: Fall Restrictions Weight Bearing Restrictions: No      Mobility  Bed Mobility Overal bed mobility: Needs Assistance Bed Mobility: Supine to Sit;Sit to Supine     Supine to sit: Mod assist Sit to supine: Min assist;+2 for safety/equipment   General bed mobility comments: Pt with very poor ability to shift weight anteriorly and maintain upright  balance. He leans backwards heavily when sitting at EOB and doesn't appear to understand cues to shift weight forward  Transfers Overall transfer level: Needs assistance Equipment used: Rolling walker (2 wheeled) Transfers: Sit to/from Stand Sit to Stand: Mod assist         General  transfer comment: Pt leans backwards heavily and is unable to self correct with cues. Unable to remain in standing without heavy assist from therapist due to imbalance. Pt able to transfer with +2 to [facility] but is very unsteady  Ambulation/Gait Ambulation/Gait assistance: Min assist;+2 physical assistance Ambulation Distance (Feet): 10 Feet Assistive device: 2 person hand held assist Gait Pattern/deviations: Decreased step length - left;Decreased step length - right Gait velocity: Decreased Gait velocity interpretation: <1.8 ft/sec, indicative of risk for recurrent falls General Gait Details: Pt able to ambulate 5' forward, turn around, and return to bed. He is very unstable and requires assist to prevent posterior LOB with ambulation. Pt with one trip and appears to almost fall but steadied by therapist and CNA. Pt returned to bed  Stairs            Wheelchair Mobility    Modified Rankin (Stroke Patients Only)       Balance Overall balance assessment: Needs assistance Sitting-balance support: Bilateral upper extremity supported Sitting balance-Leahy Scale: Poor Sitting balance - Comments: Pt continually falls backwards in bed and is unable to understand instructions to remain upright in sitting. Pt requires modA+1 to remain upright as he constantly falls backwards   Standing balance support: Bilateral upper extremity supported Standing balance-Leahy Scale: Poor Standing balance comment: Pt with heavy posterior leaning and supporting back of knees on bed. Bilateral UE support however still unable to stabilize or shift weight anteriorly                             Pertinent Vitals/Pain Pain Assessment: No/denies pain    Home Living Family/patient expects to be discharged to:: Private residence Living Arrangements: Spouse/significant other Available Help at Discharge: Family Type of Home: House Home Access: Level entry     Home Layout: Two level Home Equipment:  Shower seat - built in;Walker - 2 wheels;Kasandra Knudsen - single point      Prior Function Level of Independence: Independent         Comments: Pt was independent prior to CVA in June 2017. Family currently reports frequent fluctuations in mobility at home. Son states that sometimes he appears to walk well and others he is very unsteady. Pt denies any syncopal episodes     Hand Dominance   Dominant Hand: Right    Extremity/Trunk Assessment   Upper Extremity Assessment Upper Extremity Assessment: RUE deficits/detail RUE Deficits / Details: Pt with some R shoulder flexion weakness. Unclear if this is chronic as CVA affected his R side. Reports intact sensation to light touch. Pt with dymetria noted with finger to nose testing on RUE and positive pronator drift on RUE.    Lower Extremity Assessment Lower Extremity Assessment: Generalized weakness       Communication   Communication: No difficulties  Cognition Arousal/Alertness: Awake/alert Behavior During Therapy: Flat affect Overall Cognitive Status: Impaired/Different from baseline Area of Impairment: Orientation Orientation Level: Disoriented to;Time             General Comments: At time pt responds quickly to questions and other times he requires an extended time and prompting to arrive at answers. Pt unable to remember that  it is December but knows that Christmas is coming up soon. He takes approximately 60-90 seconds to come up with the name of the president. Sometimes his answers are not consistent with the questions being asked    General Comments      Exercises     Assessment/Plan    PT Assessment Patient needs continued PT services  PT Problem List Decreased strength;Decreased activity tolerance;Decreased balance;Decreased mobility;Decreased cognition;Decreased knowledge of use of DME;Decreased safety awareness          PT Treatment Interventions DME instruction;Gait training;Functional mobility training;Stair  training;Therapeutic exercise;Balance training;Neuromuscular re-education;Cognitive remediation;Patient/family education    PT Goals (Current goals can be found in the Care Plan section)  Acute Rehab PT Goals Patient Stated Goal: Improve safety with ambulation PT Goal Formulation: With patient/family Time For Goal Achievement: 07/21/16 Potential to Achieve Goals: Fair    Frequency Min 2X/week   Barriers to discharge Decreased caregiver support Lives with wife but pt currently requiring +2 assist for bed mobility, transfers, and very limited ambulation    Co-evaluation               End of Session Equipment Utilized During Treatment: Gait belt Activity Tolerance: Patient tolerated treatment well Patient left: in bed;with call bell/phone within reach;with bed alarm set;with family/visitor present Nurse Communication: Mobility status;Other (comment) (MD paged concerning poor static sitting balance, cognition)    Functional Assessment Tool Used: clinical judgement Functional Limitation: Mobility: Walking and moving around Mobility: Walking and Moving Around Current Status [id number]): At least 60 percent but less than 80 percent impaired, limited or restricted Mobility: Walking and Moving Around Goal Status [phone]): At least 20 percent but less than 40 percent impaired, limited or restricted    Time: 1415-1500 PT Time Calculation (min) (ACUTE ONLY): 45 min   Charges:   PT Evaluation $PT Eval High Complexity: 1 Procedure     PT G Codes:   PT G-Codes **NOT FOR INPATIENT CLASS** Functional Assessment Tool Used: clinical judgement Functional Limitation: Mobility: Walking and moving around Mobility: Walking and Moving Around Current Status [id number]): At least 60 percent but less than 80 percent impaired, limited or restricted Mobility: Walking and Moving Around Goal Status [phone]): At least 20 percent but less than 40 percent impaired, limited or restricted   Phillips Grout PT,  DPT   Huprich,Jason 07/07/2016, 3:24 PM

## 2016-07-07 NOTE — ED Notes (Signed)
Pt was given a warm blanket per family request

## 2016-07-07 NOTE — Clinical Social Work Note (Signed)
Clinical Social Work Assessment  Patient Details  Name: Fred HOLLORAN Sr. MRN: [id number] Date of Birth: [birth year]  Date of referral:  07/07/16               Reason for consult:  Facility Placement                Permission sought to share information with:  Chartered certified accountant granted to share information::  Yes, Verbal Permission Granted  Name::      [place]::     Relationship::     Contact Information:     Housing/Transportation Living arrangements for the past 2 months:  [place] of Information:  Patient, Adult Children, Spouse Patient Interpreter Needed:  None Criminal Activity/Legal Involvement Pertinent to Current Situation/Hospitalization:  No - Comment as needed Significant Relationships:  Adult Children, Spouse Lives with:  Spouse Do you feel safe going back to the place where you live?  Yes Need for family participation in patient care:  Yes (Comment)  Care giving concerns:  Patient lives in [place] with his wife Fred Jones and they were about to move into [place].    Social Worker assessment / plan:  Holiday representative (CSW) reviewed chart and noted that PT is recommending SNF. CSW met with patient and his wife Fred Jones and daughter Fred Jones [phone] were at bedside. CSW introduced self and explained role of CSW department. Per wife they were about to move into the [place] independent living. CSW explained SNF process and that Health Team will have to approve SNF. Patient and family are agreeable to SNF search and prefer [employer]  FL2 complete and faxed out. Per [facility] admissions coordinator at [facility] they can accept patient on the [facility] and he will have a private room. Patient and family aware of bed offer at [facility], and they accepted. Sharyn Lull is aware of accepted offer. CSW sent [place] Team case manager a message making her aware of  above. CSW will continue to follow and assist as needed.   Employment status:  Retired Nurse, adult PT Recommendations:  [place] / Referral to community resources:  [place]  Patient/Family's Response to care:  Patient and family are agreeable for patient to D/C to [employer] [facility].   Patient/Family's Understanding of and Emotional Response to Diagnosis, Current Treatment, and Prognosis:  Patient and family were very pleasant and thanked CSW for assistance.   Emotional Assessment Appearance:  Appears stated age Attitude/Demeanor/Rapport:    Affect (typically observed):  Accepting, Adaptable, Pleasant Orientation:  Oriented to Self, Oriented to Place, Fluctuating Orientation (Suspected and/or reported Sundowners), Oriented to  Time Alcohol / Substance use:  Not Applicable Psych involvement (Current and /or in the community):  No (Comment)  Discharge Needs  Concerns to be addressed:  Discharge Planning Concerns Readmission within the last 30 days:  No Current discharge risk:  Dependent with Mobility Barriers to Discharge:  Continued Medical Work up   [employer], Veronia Beets, LCSW 07/07/2016, 5:35 PM

## 2016-07-07 NOTE — ED Notes (Signed)
Pt not discharged home pt condition changed pt unable to ambulate. Pt couldn't stand pt putting all his weight on to ED tech assisting patient. Pt helped back to bed with 2 person assist. Dr. Dahlia Client and family made aware. Dr. Dahlia Client trying to see if can be admitted due to change into condition.

## 2016-07-07 NOTE — ED Notes (Signed)
Pt to discharge after IV abt.

## 2016-07-07 NOTE — ED Notes (Signed)
IV taking off prior to planed dischagre.at 0200. Admitting MD Marcille Blanco made aware asked if needed to replace. Dr. Marcille Blanco verbally stated no not at this time.

## 2016-07-07 NOTE — Progress Notes (Signed)
Patient with delayed responses and at times answers appropriately. VSS at this time. Notified MD of mental status, no new orders at this time. Will continue to monitor.

## 2016-07-07 NOTE — Clinical Social Work Placement (Signed)
   CLINICAL SOCIAL WORK PLACEMENT  NOTE  Date:  07/07/2016  Patient Details  Name: AHIJAH OVER Sr. MRN: [id number] Date of Birth: [birth year]  Clinical Social Work is seeking post-discharge placement for this patient at the [place] level of care (*CSW will initial, date and re-position this form in  chart as items are completed):  Yes   Patient/family provided with [place] Work Department's list of facilities offering this level of care within the geographic area requested by the patient (or if unable, by the patient's family).  Yes   Patient/family informed of their freedom to choose among providers that offer the needed level of care, that participate in Medicare, Medicaid or managed care program needed by the patient, have an available bed and are willing to accept the patient.  Yes   Patient/family informed of [facility]'s ownership interest in [facility] and [facility], as well as of the fact that they are under no obligation to receive care at these facilities.  PASRR submitted to EDS on 07/07/16     PASRR number received on 07/07/16     Existing PASRR number confirmed on       FL2 transmitted to all facilities in geographic area requested by pt/family on 07/07/16     FL2 transmitted to all facilities within larger geographic area on       Patient informed that his/her managed care company has contracts with or will negotiate with certain facilities, including the following:        Yes   Patient/family informed of bed offers received.  Patient chooses bed at  [facility] )     Physician recommends and patient chooses bed at      Patient to be transferred to   on  .  Patient to be transferred to facility by       Patient family notified on   of transfer.  Name of family member notified:        PHYSICIAN       Additional Comment:    _______________________________________________ Erendira Crabtree, Veronia Beets,  LCSW 07/07/2016, 5:33 PM

## 2016-07-07 NOTE — ED Provider Notes (Signed)
-----------------------------------------   2:26 AM on 07/07/2016 -----------------------------------------   Blood pressure (!) 150/73, pulse 62, temperature 98.8 F (37.1 C), temperature source Oral, resp. rate 18, height 6' (1.829 m), weight 160 lb (72.6 kg), SpO2 97 %.  Assuming care from Dr. Archie Balboa.  In short, Fred BRUNER Sr. is a 82 y.o. male with a chief complaint of Altered Mental Status .  Refer to the original H&P for additional details.  The current plan of care is to discharge the patient after a dose of antibiotics.  After the antibiotics the nurse attempted to get the patient up to discharge him home. The patient at that time was unable to ambulate. He was leaning all of his weight on the tech and could not walk by himself. Given the patient's continued weakness and his inability to walk I will admit the patient to the hospitalist service. The family reports that this was their concern so they agree with the admission for further evaluation.    Loney Hering, MD 07/07/16 [phone]

## 2016-07-08 ENCOUNTER — Encounter
Admission: RE | Admit: 2016-07-08 | Discharge: 2016-07-08 | Disposition: A | Discharge status: Home / Self Care | Payer: PPO | Source: Ambulatory Visit | Attending: Internal Medicine | Admitting: Internal Medicine

## 2016-07-08 DIAGNOSIS — R41841 Cognitive communication deficit: Secondary | ICD-10-CM | POA: Diagnosis not present

## 2016-07-08 DIAGNOSIS — J449 Chronic obstructive pulmonary disease, unspecified: Secondary | ICD-10-CM | POA: Diagnosis not present

## 2016-07-08 DIAGNOSIS — K219 Gastro-esophageal reflux disease without esophagitis: Secondary | ICD-10-CM | POA: Diagnosis not present

## 2016-07-08 DIAGNOSIS — G20 Parkinson's disease: Secondary | ICD-10-CM | POA: Diagnosis not present

## 2016-07-08 DIAGNOSIS — R293 Abnormal posture: Secondary | ICD-10-CM | POA: Diagnosis not present

## 2016-07-08 DIAGNOSIS — R2681 Unsteadiness on feet: Secondary | ICD-10-CM | POA: Diagnosis not present

## 2016-07-08 DIAGNOSIS — Z7902 Long term (current) use of antithrombotics/antiplatelets: Secondary | ICD-10-CM | POA: Diagnosis not present

## 2016-07-08 DIAGNOSIS — F0280 Dementia in other diseases classified elsewhere without behavioral disturbance: Secondary | ICD-10-CM | POA: Diagnosis not present

## 2016-07-08 DIAGNOSIS — E785 Hyperlipidemia, unspecified: Secondary | ICD-10-CM | POA: Diagnosis not present

## 2016-07-08 DIAGNOSIS — Z7951 Long term (current) use of inhaled steroids: Secondary | ICD-10-CM | POA: Diagnosis not present

## 2016-07-08 DIAGNOSIS — I10 Essential (primary) hypertension: Secondary | ICD-10-CM | POA: Diagnosis not present

## 2016-07-08 DIAGNOSIS — Z8673 Personal history of transient ischemic attack (TIA), and cerebral infarction without residual deficits: Secondary | ICD-10-CM | POA: Diagnosis not present

## 2016-07-08 DIAGNOSIS — F329 Major depressive disorder, single episode, unspecified: Secondary | ICD-10-CM | POA: Diagnosis not present

## 2016-07-08 DIAGNOSIS — M6281 Muscle weakness (generalized): Secondary | ICD-10-CM | POA: Diagnosis not present

## 2016-07-08 DIAGNOSIS — G933 Postviral fatigue syndrome: Secondary | ICD-10-CM | POA: Diagnosis not present

## 2016-07-08 DIAGNOSIS — W19XXXA Unspecified fall, initial encounter: Secondary | ICD-10-CM | POA: Diagnosis not present

## 2016-07-08 DIAGNOSIS — J40 Bronchitis, not specified as acute or chronic: Secondary | ICD-10-CM | POA: Diagnosis not present

## 2016-07-08 DIAGNOSIS — R279 Unspecified lack of coordination: Secondary | ICD-10-CM | POA: Diagnosis not present

## 2016-07-08 DIAGNOSIS — R296 Repeated falls: Secondary | ICD-10-CM | POA: Diagnosis not present

## 2016-07-08 LAB — HEMOGLOBIN A1C
Hgb A1c MFr Bld: 5.8 % — ABNORMAL HIGH (ref 4.8–5.6)
Mean Plasma Glucose: 120 mg/dL

## 2016-07-08 NOTE — Progress Notes (Signed)
Pt. Provided [facility] Cpap unit for use during hospital stay. Pt's home Cpap not functioning due to broken circuit hose.

## 2016-07-08 NOTE — Clinical Social Work Placement (Signed)
   CLINICAL SOCIAL WORK PLACEMENT  NOTE  Date:  07/08/2016  Patient Details  Name: Fred ZAPIEN Sr. MRN: [id number] Date of Birth: 02-24-1931  Clinical Social Work is seeking post-discharge placement for this patient at the [place] level of care (*CSW will initial, date and re-position this form in  chart as items are completed):  Yes   Patient/family provided with [place] Work Department's list of facilities offering this level of care within the geographic area requested by the patient (or if unable, by the patient's family).  Yes   Patient/family informed of their freedom to choose among providers that offer the needed level of care, that participate in Medicare, Medicaid or managed care program needed by the patient, have an available bed and are willing to accept the patient.  Yes   Patient/family informed of [facility]'s ownership interest in [facility] and [facility], as well as of the fact that they are under no obligation to receive care at these facilities.  PASRR submitted to EDS on 07/07/16     PASRR number received on 07/07/16     Existing PASRR number confirmed on       FL2 transmitted to all facilities in geographic area requested by pt/family on 07/07/16     FL2 transmitted to all facilities within larger geographic area on       Patient informed that his/her managed care company has contracts with or will negotiate with certain facilities, including the following:        Yes   Patient/family informed of bed offers received.  Patient chooses bed at  [facility] )     Physician recommends and patient chooses bed at      Patient to be transferred to  [facility] ) on 07/08/16.  Patient to be transferred to facility by  [facility] )     Patient family notified on 07/08/16 of transfer.  Name of family member notified:   (Patinet's daughter Fred Jones is at bedside and aware of D/C today.   )      PHYSICIAN       Additional Comment:    _______________________________________________ Domnique Vantine, Veronia Beets, LCSW 07/08/2016, 12:27 PM

## 2016-07-08 NOTE — Progress Notes (Signed)
Per patient's wife patient has a Cpap machine from home that can be brought to [place]. Clinical Education officer, museum (CSW) sent [employer] at [employer] a message making her aware of above.   [employer], LCSW [phone]

## 2016-07-08 NOTE — Progress Notes (Signed)
Called report to [place] Therapist, sports at [facility]. EMS called for transport.

## 2016-07-08 NOTE — Discharge Summary (Signed)
[facility] NAME: Fred Jones    MR#:  [id number]  DATE OF BIRTH:  13-Mar-1931  DATE OF ADMISSION:  07/06/2016 ADMITTING PHYSICIAN: Harrie Foreman, MD  DATE OF DISCHARGE: 07/08/2016  PRIMARY CARE PHYSICIAN: Rusty Aus, MD    ADMISSION DIAGNOSIS:  Weakness [R53.1]  DISCHARGE DIAGNOSIS:  Active Problems:   Weakness   SECONDARY DIAGNOSIS:   Past Medical History:  Diagnosis Date  . Arthritis    fingers, neck  . Cancer of prostate ([place])   . Cancer of skin   . Depression   . GERD (gastroesophageal reflux disease)    rare  . Hypertension    Echo s/p fall 11/15    HOSPITAL COURSE:   82 year old male with past medical history of Parkinson's, dementia, hypertension, GERD, depression, history of prostate cancer who presented to the hospital due to recurrent falls and weakness.  1. Recurrent falls and weakness- due to his underlying Parkinson's disease. CT head on admission showed no evidence of acute neurologic abnormality. - Seen by PT and they recommend SNF/STR and pt. Is being discharged there presently.  - no other acute infectious or metabolic source.   2. History of Parkinson's disease/dementia- Pt. continue Sinemet, Aricept. - follow with Dr. Jennings Books as outpatient.   3. Depression- pt. Will cont. Cymbalta.  4. Hyperlipidemia- pt. Will continue atorvastatin  DISCHARGE CONDITIONS:   Stable.  CONSULTS OBTAINED:    DRUG ALLERGIES:  No Known Allergies  DISCHARGE MEDICATIONS:   Allergies as of 07/08/2016   No Known Allergies     Medication List    TAKE these medications   atorvastatin 10 MG tablet Commonly known as:  LIPITOR Take 5 mg by mouth daily.   carbidopa-levodopa 25-250 MG tablet Commonly known as:  SINEMET IR Take 1 tablet by mouth 3 (three) times daily.   clopidogrel 75 MG tablet Commonly known as:  PLAVIX Take 1 tablet (75 mg total) by mouth daily.   donepezil 5 MG  tablet Commonly known as:  ARICEPT Take 5 mg by mouth at bedtime.   DULoxetine 30 MG capsule Commonly known as:  CYMBALTA Take 30 mg by mouth daily.         DISCHARGE INSTRUCTIONS:   DIET:  Regular diet  DISCHARGE CONDITION:  Stable  ACTIVITY:  Activity as tolerated  OXYGEN:  Home Oxygen: No.   Oxygen Delivery: room air  DISCHARGE LOCATION:  nursing home   If you experience worsening of your admission symptoms, develop shortness of breath, life threatening emergency, suicidal or homicidal thoughts you must seek medical attention immediately by calling 911 or calling your MD immediately  if symptoms less severe.  You Must read complete instructions/literature along with all the possible adverse reactions/side effects for all the Medicines you take and that have been prescribed to you. Take any new Medicines after you have completely understood and accpet all the possible adverse reactions/side effects.   Please note  You were cared for by a hospitalist during your hospital stay. If you have any questions about your discharge medications or the care you received while you were in the hospital after you are discharged, you can call the unit and asked to speak with the hospitalist on call if the hospitalist that took care of you is not available. Once you are discharged, your primary care physician will handle any further medical issues. Please note that NO REFILLS for any discharge medications will be authorized once you  are discharged, as it is imperative that you return to your primary care physician (or establish a relationship with a primary care physician if you do not have one) for your aftercare needs so that they can reassess your need for medications and monitor your lab values.     Today   No acute events overnight.  Wife at bedside.  D/c to SNF/STR today.   VITAL SIGNS:  Blood pressure (!) 156/67, pulse 86, temperature 99.9 F (37.7 C), temperature source  Axillary, resp. rate 16, height 6' (1.829 m), weight 74.3 kg (163 lb 12.8 oz), SpO2 94 %.  I/O:   Intake/Output Summary (Last 24 hours) at 07/08/16 1050 Last data filed at 07/08/16 0900  Gross per 24 hour  Intake              480 ml  Output                0 ml  Net              480 ml    PHYSICAL EXAMINATION:   GENERAL:  82 y.o.-year-old patient lying in the bed in no acute distress.  EYES: Pupils equal, round, reactive to light and accommodation. No scleral icterus. Extraocular muscles intact.  HEENT: Head atraumatic, normocephalic. Oropharynx and nasopharynx clear.  NECK:  Supple, no jugular venous distention. No thyroid enlargement, no tenderness.  LUNGS: Normal breath sounds bilaterally, no wheezing, rales, rhonchi. No use of accessory muscles of respiration.  CARDIOVASCULAR: S1, S2 normal. No murmurs, rubs, or gallops.  ABDOMEN: Soft, nontender, nondistended. Bowel sounds present. No organomegaly or mass.  EXTREMITIES: No cyanosis, clubbing or edema b/l.    NEUROLOGIC: Cranial nerves II through XII are intact. No focal Motor or sensory deficits b/l. No tremor   PSYCHIATRIC: The patient is alert and oriented x 3.  SKIN: No obvious rash, lesion, or ulcer.    DATA REVIEW:   CBC  Recent Labs Lab 07/06/16 2027  WBC 10.7*  HGB 14.0  HCT 41.3  PLT 249    Chemistries   Recent Labs Lab 07/06/16 2027  NA 136  K 3.8  CL 105  CO2 26  GLUCOSE 113*  BUN 17  CREATININE 1.10  CALCIUM 10.1    Cardiac Enzymes  Recent Labs Lab 07/06/16 2027  TROPONINI <0.03    Microbiology Results  No results found for this or any previous visit.  RADIOLOGY:  Dg Chest 2 View  Result Date: 07/06/2016 CLINICAL DATA:  82 year old male with altered mental status and weakness. Cough. EXAM: CHEST  2 VIEW COMPARISON:  Chest radiograph dated 02/10/2009 FINDINGS: The lungs are clear. There is no pleural effusion or pneumothorax. The cardiac silhouette is within normal limits. There is  mild atherosclerotic calcification of the aortic arch. No acute osseous pathology. IMPRESSION: No active cardiopulmonary disease. Electronically Signed   By: Anner Crete M.D.   On: 07/06/2016 21:27   Ct Head Wo Contrast  Result Date: 07/06/2016 CLINICAL DATA:  Altered mental status. Difficulty walking today, since 18:00. EXAM: CT HEAD WITHOUT CONTRAST TECHNIQUE: Contiguous axial images were obtained from the base of the skull through the vertex without intravenous contrast. COMPARISON:  12/21/2015 FINDINGS: Brain: There is no intracranial hemorrhage, mass or evidence of acute infarction. There is moderate generalized atrophy. There is moderate chronic microvascular ischemic change. There is no significant extra-axial fluid collection. No acute intracranial findings are evident. Vascular: No hyperdense vessel or unexpected calcification. Skull: Normal. Negative for fracture or focal lesion.  Sinuses/Orbits: No acute finding. Other: None. IMPRESSION: No acute intracranial findings. There is moderate generalized atrophy and chronic appearing white matter hypodensities consistent with small vessel ischemic disease. Electronically Signed   By: Andreas Newport M.D.   On: 07/06/2016 21:21      Management plans discussed with the patient, family and they are in agreement.  CODE STATUS:     Code Status Orders        Start     Ordered   07/07/16 0535  Full code  Continuous     07/07/16 0534    Code Status History    Date Active Date Inactive Code Status Order ID Comments User Context   12/20/2015 11:30 PM 12/22/2015  8:25 PM Full Code [id number]  Lance Coon, MD Inpatient    Advance Directive Documentation   Flowsheet Row Most Recent Value  Type of Advance Directive  Healthcare Power of Attorney, Living will  Pre-existing out of facility DNR order (yellow form or pink MOST form)  No data  "MOST" Form in Place?  No data      TOTAL TIME TAKING CARE OF THIS PATIENT: 40 minutes.     Henreitta Leber M.D on 07/08/2016 at 10:50 AM  Between 7am to 6pm - Pager - [phone]  After 6pm go to [email or web address] - Proofreader  [facility]  Office  [phone]  CC: Primary care physician; Rusty Aus, MD

## 2016-07-08 NOTE — Progress Notes (Signed)
Patient is medically stable for D/C to [facility] today. Per [facility] admissions coordinator at [facility] patient will go to [facility]. RN will call report at [phone] and arrange EMS for transport. Health Team authorization has been received. Auth # [id number]. Clinical Education officer, museum (CSW) sent D/C orders to [employer] via [place]. Patient and his daughter Edd Fabian are aware of above. Please reconsult if future social work needs arise. CSW signing off.   [employer], LCSW [phone]

## 2016-07-19 ENCOUNTER — Encounter
Admission: RE | Admit: 2016-07-19 | Discharge: 2016-07-19 | Disposition: A | Discharge status: Home / Self Care | Payer: PPO | Source: Ambulatory Visit | Attending: Internal Medicine | Admitting: Internal Medicine

## 2016-07-19 DIAGNOSIS — G20 Parkinson's disease: Secondary | ICD-10-CM | POA: Diagnosis not present

## 2016-07-19 DIAGNOSIS — E785 Hyperlipidemia, unspecified: Secondary | ICD-10-CM | POA: Diagnosis not present

## 2016-07-19 DIAGNOSIS — M6281 Muscle weakness (generalized): Secondary | ICD-10-CM | POA: Diagnosis not present

## 2016-07-19 DIAGNOSIS — F0280 Dementia in other diseases classified elsewhere without behavioral disturbance: Secondary | ICD-10-CM | POA: Diagnosis not present

## 2016-07-19 DIAGNOSIS — I10 Essential (primary) hypertension: Secondary | ICD-10-CM | POA: Diagnosis not present

## 2016-07-19 DIAGNOSIS — F329 Major depressive disorder, single episode, unspecified: Secondary | ICD-10-CM | POA: Diagnosis not present

## 2016-07-19 DIAGNOSIS — R293 Abnormal posture: Secondary | ICD-10-CM | POA: Diagnosis not present

## 2016-07-19 DIAGNOSIS — Z7951 Long term (current) use of inhaled steroids: Secondary | ICD-10-CM | POA: Diagnosis not present

## 2016-07-19 DIAGNOSIS — R279 Unspecified lack of coordination: Secondary | ICD-10-CM | POA: Diagnosis not present

## 2016-07-19 DIAGNOSIS — R41841 Cognitive communication deficit: Secondary | ICD-10-CM | POA: Diagnosis not present

## 2016-07-19 DIAGNOSIS — R296 Repeated falls: Secondary | ICD-10-CM | POA: Diagnosis not present

## 2016-07-19 DIAGNOSIS — Z7902 Long term (current) use of antithrombotics/antiplatelets: Secondary | ICD-10-CM | POA: Diagnosis not present

## 2016-07-19 DIAGNOSIS — J449 Chronic obstructive pulmonary disease, unspecified: Secondary | ICD-10-CM | POA: Diagnosis not present

## 2016-07-19 DIAGNOSIS — K219 Gastro-esophageal reflux disease without esophagitis: Secondary | ICD-10-CM | POA: Diagnosis not present

## 2016-07-19 DIAGNOSIS — R2681 Unsteadiness on feet: Secondary | ICD-10-CM | POA: Diagnosis not present

## 2016-07-19 DIAGNOSIS — Z8673 Personal history of transient ischemic attack (TIA), and cerebral infarction without residual deficits: Secondary | ICD-10-CM | POA: Diagnosis not present

## 2016-07-23 DIAGNOSIS — G4733 Obstructive sleep apnea (adult) (pediatric): Secondary | ICD-10-CM | POA: Diagnosis not present

## 2016-07-25 DIAGNOSIS — G4733 Obstructive sleep apnea (adult) (pediatric): Secondary | ICD-10-CM | POA: Diagnosis not present

## 2016-07-26 ENCOUNTER — Non-Acute Institutional Stay (SKILLED_NURSING_FACILITY): Payer: PPO | Admitting: Gerontology

## 2016-07-26 DIAGNOSIS — R531 Weakness: Secondary | ICD-10-CM | POA: Diagnosis not present

## 2016-07-26 DIAGNOSIS — G4733 Obstructive sleep apnea (adult) (pediatric): Secondary | ICD-10-CM | POA: Diagnosis not present

## 2016-07-26 DIAGNOSIS — G20 Parkinson's disease: Secondary | ICD-10-CM | POA: Diagnosis not present

## 2016-07-26 DIAGNOSIS — R4189 Other symptoms and signs involving cognitive functions and awareness: Secondary | ICD-10-CM | POA: Diagnosis not present

## 2016-07-26 NOTE — Progress Notes (Signed)
Location:      Place of Service:  SNF (31)  Provider: Toni Arthurs, NP-C  PCP: Rusty Aus, MD Patient Care Team: Rusty Aus, MD as PCP - General (Internal Medicine)  Extended Emergency Contact Information Primary Emergency Contact: Etta Quill D Address: [address]          [place], [place] [address] Johnnette Litter of [place] Phone: [phone] Mobile Phone: [phone] Relation: Spouse Secondary Emergency Contact: Zinda,Jay Address: [address]          [place], [place] [address] Johnnette Litter of [place] Phone: [phone] Work Phone: [phone] Mobile Phone: [phone] Relation: Son  Code Status: full Goals of care:  Advanced Directive information Advanced Directives 07/07/2016  Does Patient Have a Medical Advance Directive? Yes  Type of Paramedic of [place];Living will  Does patient want to make changes to medical advance directive? No - Patient declined  Copy of [place] in Chart? No - copy requested  Would patient like information on creating a medical advance directive? -     No Known Allergies  Chief Complaint  Patient presents with  . Discharge Note    HPI:  83 y.o. male seen today for discharge evaluation. Pt was admitted to the facility for weakness, deconditioning. Pt has progressed well with therapies. He is walking with the walker steadily. No indication of unsafe movements. Pt denies n/c/d/f/c/cp/sob/ha/abd pain/dizziness. Pt denies pain. Regular BMs. Appetite at baseline. VSS. No other complaints. Pt is smiling, in good spirits and verbalizes he is excited and ready to go home.     Past Medical History:  Diagnosis Date  . Arthritis    fingers, neck  . Cancer of prostate ([place])   . Cancer of skin   . Depression   . GERD (gastroesophageal reflux disease)    rare  . Hypertension    Echo s/p fall 11/15    Past Surgical History:  Procedure Laterality Date  . CATARACT  EXTRACTION W/PHACO Right 11/27/2014   Procedure: CATARACT EXTRACTION PHACO AND INTRAOCULAR LENS PLACEMENT (IOC);  Surgeon: Leandrew Koyanagi, MD;  Location: [facility];  Service: Ophthalmology;  Laterality: Right;  . EYE SURGERY Left 09/25/14   cataract @[facility]   . HERNIA REPAIR  1999  . KIDNEY STONE SURGERY    . MELANOMA EXCISION    . PROSTATE SURGERY  2007      reports that he has never smoked. He has never used smokeless tobacco. He reports that he drinks about 3.0 oz of alcohol per week . He reports that he does not use drugs. Social History   Social History  . Marital status: Married    Spouse name: N/A  . Number of children: N/A  . Years of education: N/A   Occupational History  . Not on file.   Social History Main Topics  . Smoking status: Never Smoker  . Smokeless tobacco: Never Used  . Alcohol use 3.0 oz/week    5 Glasses of wine per week  . Drug use: No  . Sexual activity: Not on file   Other Topics Concern  . Not on file   Social History Narrative  . No narrative on file   Functional Status Survey:    No Known Allergies  There are no preventive care reminders to display for this patient.  Medications: Allergies as of 07/26/2016   No Known Allergies     Medication List       Accurate as of 07/26/16 12:09  PM. Always use your most recent med list.          atorvastatin 10 MG tablet Commonly known as:  LIPITOR Take 5 mg by mouth daily.   carbidopa-levodopa 25-250 MG tablet Commonly known as:  SINEMET IR Take 1 tablet by mouth 3 (three) times daily.   clopidogrel 75 MG tablet Commonly known as:  PLAVIX Take 1 tablet (75 mg total) by mouth daily.   donepezil 5 MG tablet Commonly known as:  ARICEPT Take 5 mg by mouth at bedtime.   DULoxetine 30 MG capsule Commonly known as:  CYMBALTA Take 30 mg by mouth daily.       Review of Systems  Constitutional: Negative for activity change, appetite change, chills, diaphoresis and fever.    HENT: Negative for congestion, sneezing, sore throat, trouble swallowing and voice change.   Eyes: Negative for pain, redness and visual disturbance.  Respiratory: Negative for apnea, cough, choking, chest tightness, shortness of breath and wheezing.   Cardiovascular: Negative for chest pain, palpitations and leg swelling.  Gastrointestinal: Negative for abdominal distention, abdominal pain, constipation, diarrhea and nausea.  Genitourinary: Negative for difficulty urinating, dysuria, frequency and urgency.  Musculoskeletal: Negative for back pain, gait problem and myalgias. Arthralgias: typical arthritis.  Skin: Negative for color change, pallor, rash and wound.  Neurological: Negative for dizziness, tremors, syncope, speech difficulty, weakness, numbness and headaches.  Psychiatric/Behavioral: Negative for agitation and behavioral problems.  All other systems reviewed and are negative.   Vitals:   07/23/16 0600  BP: 134/83  Pulse: 72  Resp: 16  Temp: (!) 96.8 F (36 C)  SpO2: 97%   There is no height or weight on file to calculate BMI. Physical Exam  Constitutional: He is oriented to person, place, and time. Vital signs are normal. He appears well-developed and well-nourished. He is active and cooperative. He does not appear ill. No distress.  HENT:  Head: Normocephalic and atraumatic.  Mouth/Throat: Uvula is midline, oropharynx is clear and moist and mucous membranes are normal. Mucous membranes are not pale, not dry and not cyanotic.  Eyes: Conjunctivae, EOM and lids are normal. Pupils are equal, round, and reactive to light.  Neck: Trachea normal, normal range of motion and full passive range of motion without pain. Neck supple. No JVD present. No tracheal deviation, no edema and no erythema present. No thyromegaly present.  Cardiovascular: Normal rate, regular rhythm, normal heart sounds, intact distal pulses and normal pulses.  Exam reveals no gallop, no distant heart sounds  and no friction rub.   No murmur heard. Pulmonary/Chest: Effort normal and breath sounds normal. No accessory muscle usage. No respiratory distress. He has no wheezes. He has no rales. He exhibits no tenderness.  Abdominal: Normal appearance and bowel sounds are normal. He exhibits no distension and no ascites. There is no tenderness.  Musculoskeletal: Normal range of motion. He exhibits no edema or tenderness.  Expected osteoarthritis, stiffness  Neurological: He is alert and oriented to person, place, and time. He has normal strength.  Skin: Skin is warm, dry and intact. No rash noted. He is not diaphoretic. No cyanosis or erythema. No pallor. Nails show no clubbing.  Psychiatric: He has a normal mood and affect. His speech is normal and behavior is normal. Judgment and thought content normal. Cognition and memory are normal.  Nursing note and vitals reviewed.   Labs reviewed: Basic Metabolic Panel:  Recent Labs  12/20/15 2116 07/06/16 2027  NA 135 136  K 3.3* 3.8  CL 102 105  CO2 28 26  GLUCOSE 118* 113*  BUN 22* 17  CREATININE 1.33* 1.10  CALCIUM 10.1 10.1   Liver Function Tests:  Recent Labs  12/20/15 2116  AST 26  ALT 10*  ALKPHOS 66  BILITOT 0.5  PROT 6.5  ALBUMIN 4.0   No results for input(s): LIPASE, AMYLASE in the last 8760 hours. No results for input(s): AMMONIA in the last 8760 hours. CBC:  Recent Labs  12/20/15 2116 07/06/16 2027  WBC 12.1* 10.7*  NEUTROABS 8.1* 9.0*  HGB 13.7 14.0  HCT 40.2 41.3  MCV 89.2 89.7  PLT 262 249   Cardiac Enzymes:  Recent Labs  07/06/16 2027  TROPONINI <0.03   BNP: Invalid input(s): POCBNP CBG:  Recent Labs  12/20/15 2115  GLUCAP 115*    Procedures and Imaging Studies During Stay: Dg Chest 2 View  Result Date: 07/06/2016 CLINICAL DATA:  82 year old male with altered mental status and weakness. Cough. EXAM: CHEST  2 VIEW COMPARISON:  Chest radiograph dated 02/10/2009 FINDINGS: The lungs are clear.  There is no pleural effusion or pneumothorax. The cardiac silhouette is within normal limits. There is mild atherosclerotic calcification of the aortic arch. No acute osseous pathology. IMPRESSION: No active cardiopulmonary disease. Electronically Signed   By: Anner Crete M.D.   On: 07/06/2016 21:27   Ct Head Wo Contrast  Result Date: 07/06/2016 CLINICAL DATA:  Altered mental status. Difficulty walking today, since 18:00. EXAM: CT HEAD WITHOUT CONTRAST TECHNIQUE: Contiguous axial images were obtained from the base of the skull through the vertex without intravenous contrast. COMPARISON:  12/21/2015 FINDINGS: Brain: There is no intracranial hemorrhage, mass or evidence of acute infarction. There is moderate generalized atrophy. There is moderate chronic microvascular ischemic change. There is no significant extra-axial fluid collection. No acute intracranial findings are evident. Vascular: No hyperdense vessel or unexpected calcification. Skull: Normal. Negative for fracture or focal lesion. Sinuses/Orbits: No acute finding. Other: None. IMPRESSION: No acute intracranial findings. There is moderate generalized atrophy and chronic appearing white matter hypodensities consistent with small vessel ischemic disease. Electronically Signed   By: Andreas Newport M.D.   On: 07/06/2016 21:21    Assessment/Plan:   1. Weakness  Continue working with HHPT/OT  Fall precautions  Exercises as taught by PT/OT  Follow up with PCP asap after discharge   Patient is being discharged with the following home health services:  HHPT/OT  Patient is being discharged with the following durable medical equipment:  none  Patient has been advised to f/u with their PCP in 1-2 weeks to bring them up to date on their rehab stay.  Social services at facility was responsible for arranging this appointment.  Pt was provided with a 30 day supply of prescriptions for medications and refills must be obtained from their PCP.   For controlled substances, a more limited supply may be provided adequate until PCP appointment only.  Future labs/tests needed:    Family/ staff Communication:   Total Time:  Documentation:  Face to Face:  Family/Phone:  Vikki Ports, NP-C [facility] [address], [place] [address] Cell Phone (Mon-Fri 8am-5pm):  [phone] On Call:  [phone] & follow prompts after 5pm & weekends Office Phone:  [phone] Office Fax:  [phone]

## 2016-07-28 DIAGNOSIS — I10 Essential (primary) hypertension: Secondary | ICD-10-CM | POA: Diagnosis not present

## 2016-07-28 DIAGNOSIS — F0280 Dementia in other diseases classified elsewhere without behavioral disturbance: Secondary | ICD-10-CM | POA: Diagnosis not present

## 2016-07-28 DIAGNOSIS — Z7951 Long term (current) use of inhaled steroids: Secondary | ICD-10-CM | POA: Diagnosis not present

## 2016-07-28 DIAGNOSIS — J449 Chronic obstructive pulmonary disease, unspecified: Secondary | ICD-10-CM | POA: Diagnosis not present

## 2016-07-28 DIAGNOSIS — G20 Parkinson's disease: Secondary | ICD-10-CM | POA: Diagnosis not present

## 2016-07-28 DIAGNOSIS — R296 Repeated falls: Secondary | ICD-10-CM | POA: Diagnosis not present

## 2016-07-28 DIAGNOSIS — E785 Hyperlipidemia, unspecified: Secondary | ICD-10-CM | POA: Diagnosis not present

## 2016-07-28 DIAGNOSIS — Z8546 Personal history of malignant neoplasm of prostate: Secondary | ICD-10-CM | POA: Diagnosis not present

## 2016-07-28 DIAGNOSIS — Z8673 Personal history of transient ischemic attack (TIA), and cerebral infarction without residual deficits: Secondary | ICD-10-CM | POA: Diagnosis not present

## 2016-07-28 DIAGNOSIS — Z7902 Long term (current) use of antithrombotics/antiplatelets: Secondary | ICD-10-CM | POA: Diagnosis not present

## 2016-07-28 DIAGNOSIS — F329 Major depressive disorder, single episode, unspecified: Secondary | ICD-10-CM | POA: Diagnosis not present

## 2016-07-28 DIAGNOSIS — M1991 Primary osteoarthritis, unspecified site: Secondary | ICD-10-CM | POA: Diagnosis not present

## 2016-07-28 DIAGNOSIS — K219 Gastro-esophageal reflux disease without esophagitis: Secondary | ICD-10-CM | POA: Diagnosis not present

## 2016-07-30 DIAGNOSIS — G933 Postviral fatigue syndrome: Secondary | ICD-10-CM | POA: Diagnosis not present

## 2016-07-30 DIAGNOSIS — M6281 Muscle weakness (generalized): Secondary | ICD-10-CM | POA: Diagnosis not present

## 2016-07-30 DIAGNOSIS — G20 Parkinson's disease: Secondary | ICD-10-CM | POA: Diagnosis not present

## 2016-08-02 DIAGNOSIS — R296 Repeated falls: Secondary | ICD-10-CM | POA: Diagnosis not present

## 2016-08-02 DIAGNOSIS — M1991 Primary osteoarthritis, unspecified site: Secondary | ICD-10-CM | POA: Diagnosis not present

## 2016-08-02 DIAGNOSIS — F0280 Dementia in other diseases classified elsewhere without behavioral disturbance: Secondary | ICD-10-CM | POA: Diagnosis not present

## 2016-08-02 DIAGNOSIS — E785 Hyperlipidemia, unspecified: Secondary | ICD-10-CM | POA: Diagnosis not present

## 2016-08-02 DIAGNOSIS — Z7902 Long term (current) use of antithrombotics/antiplatelets: Secondary | ICD-10-CM | POA: Diagnosis not present

## 2016-08-02 DIAGNOSIS — Z8673 Personal history of transient ischemic attack (TIA), and cerebral infarction without residual deficits: Secondary | ICD-10-CM | POA: Diagnosis not present

## 2016-08-02 DIAGNOSIS — J449 Chronic obstructive pulmonary disease, unspecified: Secondary | ICD-10-CM | POA: Diagnosis not present

## 2016-08-02 DIAGNOSIS — F329 Major depressive disorder, single episode, unspecified: Secondary | ICD-10-CM | POA: Diagnosis not present

## 2016-08-02 DIAGNOSIS — G20 Parkinson's disease: Secondary | ICD-10-CM | POA: Diagnosis not present

## 2016-08-02 DIAGNOSIS — K219 Gastro-esophageal reflux disease without esophagitis: Secondary | ICD-10-CM | POA: Diagnosis not present

## 2016-08-02 DIAGNOSIS — I10 Essential (primary) hypertension: Secondary | ICD-10-CM | POA: Diagnosis not present

## 2016-08-02 DIAGNOSIS — Z7951 Long term (current) use of inhaled steroids: Secondary | ICD-10-CM | POA: Diagnosis not present

## 2016-08-02 DIAGNOSIS — Z8546 Personal history of malignant neoplasm of prostate: Secondary | ICD-10-CM | POA: Diagnosis not present

## 2016-08-19 DIAGNOSIS — K219 Gastro-esophageal reflux disease without esophagitis: Secondary | ICD-10-CM | POA: Diagnosis not present

## 2016-08-19 DIAGNOSIS — G20 Parkinson's disease: Secondary | ICD-10-CM | POA: Diagnosis not present

## 2016-08-19 DIAGNOSIS — M1991 Primary osteoarthritis, unspecified site: Secondary | ICD-10-CM | POA: Diagnosis not present

## 2016-08-19 DIAGNOSIS — F0280 Dementia in other diseases classified elsewhere without behavioral disturbance: Secondary | ICD-10-CM | POA: Diagnosis not present

## 2016-08-19 DIAGNOSIS — F329 Major depressive disorder, single episode, unspecified: Secondary | ICD-10-CM | POA: Diagnosis not present

## 2016-08-19 DIAGNOSIS — Z8673 Personal history of transient ischemic attack (TIA), and cerebral infarction without residual deficits: Secondary | ICD-10-CM | POA: Diagnosis not present

## 2016-08-19 DIAGNOSIS — E785 Hyperlipidemia, unspecified: Secondary | ICD-10-CM | POA: Diagnosis not present

## 2016-08-19 DIAGNOSIS — Z7951 Long term (current) use of inhaled steroids: Secondary | ICD-10-CM | POA: Diagnosis not present

## 2016-08-19 DIAGNOSIS — Z7902 Long term (current) use of antithrombotics/antiplatelets: Secondary | ICD-10-CM | POA: Diagnosis not present

## 2016-08-19 DIAGNOSIS — R296 Repeated falls: Secondary | ICD-10-CM | POA: Diagnosis not present

## 2016-08-19 DIAGNOSIS — J449 Chronic obstructive pulmonary disease, unspecified: Secondary | ICD-10-CM | POA: Diagnosis not present

## 2016-08-19 DIAGNOSIS — I10 Essential (primary) hypertension: Secondary | ICD-10-CM | POA: Diagnosis not present

## 2016-08-19 DIAGNOSIS — Z8546 Personal history of malignant neoplasm of prostate: Secondary | ICD-10-CM | POA: Diagnosis not present

## 2016-08-23 DIAGNOSIS — Z961 Presence of intraocular lens: Secondary | ICD-10-CM | POA: Diagnosis not present

## 2016-08-24 DIAGNOSIS — G4733 Obstructive sleep apnea (adult) (pediatric): Secondary | ICD-10-CM | POA: Diagnosis not present

## 2016-08-25 DIAGNOSIS — G4733 Obstructive sleep apnea (adult) (pediatric): Secondary | ICD-10-CM | POA: Diagnosis not present

## 2016-08-27 DIAGNOSIS — G20 Parkinson's disease: Secondary | ICD-10-CM | POA: Diagnosis not present

## 2016-08-27 DIAGNOSIS — M6281 Muscle weakness (generalized): Secondary | ICD-10-CM | POA: Diagnosis not present

## 2016-08-31 DIAGNOSIS — G20 Parkinson's disease: Secondary | ICD-10-CM | POA: Diagnosis not present

## 2016-08-31 DIAGNOSIS — M6281 Muscle weakness (generalized): Secondary | ICD-10-CM | POA: Diagnosis not present

## 2016-09-01 DIAGNOSIS — Z8546 Personal history of malignant neoplasm of prostate: Secondary | ICD-10-CM | POA: Diagnosis not present

## 2016-09-01 DIAGNOSIS — N3941 Urge incontinence: Secondary | ICD-10-CM | POA: Diagnosis not present

## 2016-09-01 DIAGNOSIS — N393 Stress incontinence (female) (male): Secondary | ICD-10-CM | POA: Diagnosis not present

## 2016-09-01 DIAGNOSIS — N200 Calculus of kidney: Secondary | ICD-10-CM | POA: Diagnosis not present

## 2016-09-02 DIAGNOSIS — G20 Parkinson's disease: Secondary | ICD-10-CM | POA: Diagnosis not present

## 2016-09-02 DIAGNOSIS — M6281 Muscle weakness (generalized): Secondary | ICD-10-CM | POA: Diagnosis not present

## 2016-09-07 DIAGNOSIS — M6281 Muscle weakness (generalized): Secondary | ICD-10-CM | POA: Diagnosis not present

## 2016-09-07 DIAGNOSIS — G20 Parkinson's disease: Secondary | ICD-10-CM | POA: Diagnosis not present

## 2016-09-09 DIAGNOSIS — G20 Parkinson's disease: Secondary | ICD-10-CM | POA: Diagnosis not present

## 2016-09-09 DIAGNOSIS — M6281 Muscle weakness (generalized): Secondary | ICD-10-CM | POA: Diagnosis not present

## 2016-09-13 DIAGNOSIS — M6281 Muscle weakness (generalized): Secondary | ICD-10-CM | POA: Diagnosis not present

## 2016-09-13 DIAGNOSIS — G20 Parkinson's disease: Secondary | ICD-10-CM | POA: Diagnosis not present

## 2016-09-16 DIAGNOSIS — G20 Parkinson's disease: Secondary | ICD-10-CM | POA: Diagnosis not present

## 2016-09-16 DIAGNOSIS — M6281 Muscle weakness (generalized): Secondary | ICD-10-CM | POA: Diagnosis not present

## 2016-09-20 DIAGNOSIS — K210 Gastro-esophageal reflux disease with esophagitis: Secondary | ICD-10-CM | POA: Diagnosis not present

## 2016-09-20 DIAGNOSIS — R079 Chest pain, unspecified: Secondary | ICD-10-CM | POA: Diagnosis not present

## 2016-09-22 DIAGNOSIS — G20 Parkinson's disease: Secondary | ICD-10-CM | POA: Diagnosis not present

## 2016-09-22 DIAGNOSIS — M6281 Muscle weakness (generalized): Secondary | ICD-10-CM | POA: Diagnosis not present

## 2016-09-22 DIAGNOSIS — G4733 Obstructive sleep apnea (adult) (pediatric): Secondary | ICD-10-CM | POA: Diagnosis not present

## 2016-09-23 DIAGNOSIS — G4733 Obstructive sleep apnea (adult) (pediatric): Secondary | ICD-10-CM | POA: Diagnosis not present

## 2016-09-29 DIAGNOSIS — B078 Other viral warts: Secondary | ICD-10-CM | POA: Diagnosis not present

## 2016-09-29 DIAGNOSIS — L812 Freckles: Secondary | ICD-10-CM | POA: Diagnosis not present

## 2016-09-29 DIAGNOSIS — L570 Actinic keratosis: Secondary | ICD-10-CM | POA: Diagnosis not present

## 2016-09-29 DIAGNOSIS — L821 Other seborrheic keratosis: Secondary | ICD-10-CM | POA: Diagnosis not present

## 2016-09-29 DIAGNOSIS — D485 Neoplasm of uncertain behavior of skin: Secondary | ICD-10-CM | POA: Diagnosis not present

## 2016-09-29 DIAGNOSIS — Z85828 Personal history of other malignant neoplasm of skin: Secondary | ICD-10-CM | POA: Diagnosis not present

## 2016-09-29 DIAGNOSIS — D1800 Hemangioma unspecified site: Secondary | ICD-10-CM | POA: Diagnosis not present

## 2016-09-29 DIAGNOSIS — Z1283 Encounter for screening for malignant neoplasm of skin: Secondary | ICD-10-CM | POA: Diagnosis not present

## 2016-09-29 DIAGNOSIS — L719 Rosacea, unspecified: Secondary | ICD-10-CM | POA: Diagnosis not present

## 2016-09-29 DIAGNOSIS — L2489 Irritant contact dermatitis due to other agents: Secondary | ICD-10-CM | POA: Diagnosis not present

## 2016-09-29 DIAGNOSIS — L820 Inflamed seborrheic keratosis: Secondary | ICD-10-CM | POA: Diagnosis not present

## 2016-09-29 DIAGNOSIS — L578 Other skin changes due to chronic exposure to nonionizing radiation: Secondary | ICD-10-CM | POA: Diagnosis not present

## 2016-10-11 DIAGNOSIS — G4733 Obstructive sleep apnea (adult) (pediatric): Secondary | ICD-10-CM | POA: Diagnosis not present

## 2016-10-11 DIAGNOSIS — G20 Parkinson's disease: Secondary | ICD-10-CM | POA: Diagnosis not present

## 2016-10-11 DIAGNOSIS — R4189 Other symptoms and signs involving cognitive functions and awareness: Secondary | ICD-10-CM | POA: Diagnosis not present

## 2016-10-12 DIAGNOSIS — R079 Chest pain, unspecified: Secondary | ICD-10-CM | POA: Diagnosis not present

## 2016-10-13 DIAGNOSIS — I10 Essential (primary) hypertension: Secondary | ICD-10-CM | POA: Diagnosis not present

## 2016-10-13 DIAGNOSIS — E782 Mixed hyperlipidemia: Secondary | ICD-10-CM | POA: Diagnosis not present

## 2016-10-13 DIAGNOSIS — I63422 Cerebral infarction due to embolism of left anterior cerebral artery: Secondary | ICD-10-CM | POA: Diagnosis not present

## 2016-10-13 DIAGNOSIS — G20 Parkinson's disease: Secondary | ICD-10-CM | POA: Diagnosis not present

## 2016-10-20 DIAGNOSIS — R259 Unspecified abnormal involuntary movements: Secondary | ICD-10-CM | POA: Diagnosis not present

## 2016-10-20 DIAGNOSIS — G3184 Mild cognitive impairment, so stated: Secondary | ICD-10-CM | POA: Diagnosis not present

## 2016-10-20 DIAGNOSIS — G4752 REM sleep behavior disorder: Secondary | ICD-10-CM | POA: Diagnosis not present

## 2016-10-20 DIAGNOSIS — R2689 Other abnormalities of gait and mobility: Secondary | ICD-10-CM | POA: Diagnosis not present

## 2016-10-25 DIAGNOSIS — I63422 Cerebral infarction due to embolism of left anterior cerebral artery: Secondary | ICD-10-CM | POA: Diagnosis not present

## 2016-10-25 DIAGNOSIS — E782 Mixed hyperlipidemia: Secondary | ICD-10-CM | POA: Diagnosis not present

## 2016-10-25 DIAGNOSIS — Z125 Encounter for screening for malignant neoplasm of prostate: Secondary | ICD-10-CM | POA: Diagnosis not present

## 2016-11-01 DIAGNOSIS — Z0000 Encounter for general adult medical examination without abnormal findings: Secondary | ICD-10-CM | POA: Diagnosis not present

## 2016-11-01 DIAGNOSIS — Z9989 Dependence on other enabling machines and devices: Secondary | ICD-10-CM | POA: Diagnosis not present

## 2016-11-01 DIAGNOSIS — G4733 Obstructive sleep apnea (adult) (pediatric): Secondary | ICD-10-CM | POA: Diagnosis not present

## 2016-11-01 DIAGNOSIS — F330 Major depressive disorder, recurrent, mild: Secondary | ICD-10-CM | POA: Diagnosis not present

## 2016-11-01 DIAGNOSIS — E039 Hypothyroidism, unspecified: Secondary | ICD-10-CM | POA: Diagnosis not present

## 2016-11-02 ENCOUNTER — Encounter: Payer: Self-pay | Admitting: Physical Therapy

## 2016-11-02 ENCOUNTER — Ambulatory Visit: Payer: PPO | Attending: Internal Medicine | Admitting: Physical Therapy

## 2016-11-02 DIAGNOSIS — R262 Difficulty in walking, not elsewhere classified: Secondary | ICD-10-CM | POA: Diagnosis not present

## 2016-11-02 NOTE — Therapy (Signed)
[place] MAIN [facility] SERVICES [address] [place], [place], [address] Phone: [phone]   Fax:  [phone]  Physical Therapy Evaluation/Discharge Summary  Patient Details  Name: Fred ZINDA Sr. MRN: [id number] Date of Birth: 04/18/1931 Referring Provider: Alena Bills  Encounter Date: 11/02/2016      PT End of Session - 11/02/16 1536    Visit Number 1   Number of Visits 1   Date for PT Re-Evaluation 11/02/16   Authorization Type gcode   PT Start Time 5170   PT Stop Time 1530   PT Time Calculation (min) 56 min   Activity Tolerance Patient tolerated treatment well;No increased pain   Behavior During Therapy WFL for tasks assessed/performed      Past Medical History:  Diagnosis Date  . Arthritis    fingers, neck  . Cancer of prostate ([place])   . Cancer of skin   . Depression   . GERD (gastroesophageal reflux disease)    rare  . Hypertension    Echo s/p fall 11/15    Past Surgical History:  Procedure Laterality Date  . CATARACT EXTRACTION W/PHACO Right 11/27/2014   Procedure: CATARACT EXTRACTION PHACO AND INTRAOCULAR LENS PLACEMENT (IOC);  Surgeon: Leandrew Koyanagi, MD;  Location: [facility];  Service: Ophthalmology;  Laterality: Right;  . EYE SURGERY Left 09/25/14   cataract @[facility]   . HERNIA REPAIR  1999  . KIDNEY STONE SURGERY    . MELANOMA EXCISION    . PROSTATE SURGERY  2007    There were no vitals filed for this visit.       Subjective Assessment - 11/02/16 1441    Subjective 83 yo Male reports having CVA in June 2017. He reports impaired balance and gait deficits since then. He reports getting outpatient rehab following stroke which helped some. He has been referred to PT for Parkinson's disease and limited mobility; He reports having trouble with transfers. He reports falling about a month ago. He reports tripping over something on the floor inside the house; He denies any episodes of freezing. He reports  occasional tremors in UE; He reports that he thinks his mobility has improved;    Pertinent History History of CVA, not driving, lives with wife level entry home, HTN (Controlled), recent falls;    How long can you stand comfortably? >1 hour   How long can you walk comfortably? NA   Diagnostic tests Head CT in Dec 2017 shows no acute abnormality, chronic small vessel disease;    Patient Stated Goals Get back to driving;    Currently in Pain? No/denies            [facility] PT Assessment - 11/02/16 0001      Assessment   Medical Diagnosis Parkinson's Disease   Referring Provider Alena Bills   Onset Date/Surgical Date --  June 2017   Hand Dominance Right   Next MD Visit none scheduled   Prior Therapy had PT in June 2017 for a few sessions with some improvement;      Precautions   Precautions Fall     Restrictions   Weight Bearing Restrictions No     Balance Screen   Has the patient fallen in the past 6 months Yes   How many times? 3   Has the patient had a decrease in activity level because of a fear of falling?  No   Is the patient reluctant to leave their home because of a fear of falling?  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;    Furniture conservator/restorer;    Leisure used to play golf, watch tv, likes to travel, goes to dinner;      Cognition   Overall Cognitive Status Within Functional Limits for tasks assessed    Memory --  overall reports that memory is okay;      Observation/Other Assessments   Observations Very pleasant man;      Sensation   Light Touch Appears Intact   Proprioception Appears Intact   Additional Comments no deficits noted;      Coordination   Gross Motor Movements are Fluid and Coordinated Yes   Fine Motor Movements are Fluid and Coordinated Yes  did have trouble with alternate hand movements (fast)   Finger Nose Finger Test accurate bilaterally;      Posture/Postural Control   Posture Comments WFL, does have some stiffness in cervical spine but does have erect posture;      ROM / Strength   AROM / PROM / Strength AROM;Strength     AROM   Overall AROM Comments BUE and BLE are [facility]     Strength   Overall Strength Comments BLE grossly WFL     Palpation   Palpation comment denies any tenderness to palpation;      Transfers   Comments able to transfer sit<>stand without pushing with hands;      Ambulation/Gait   Gait Comments ambulates without AD, normal gait pattern, no abnormalities noted;      Standardized Balance Assessment   Five times sit to stand comments  12.5 sec without HHA (<15 sec indicates low fall risk)   10 Meter Walk 1.17 m/s without AD (community ambulator)     Timed Up and Go Test   Normal TUG (seconds) 9.5   TUG Comments without AD, low fall risk     Functional Gait  Assessment   Gait Level Surface Walks 20 ft in less than 5.5 sec, no assistive devices, good speed, no evidence for imbalance, normal gait pattern, deviates no more than 6 in outside of the 12 in walkway width.   Change in Gait Speed Able to smoothly change walking speed without loss of balance or gait deviation. Deviate no more than 6 in outside of the 12 in walkway width.   Gait with Horizontal Head Turns Performs head turns smoothly with no change in gait. Deviates no more than 6 in outside 12 in walkway width   Gait with Vertical Head Turns Performs head turns with no  change in gait. Deviates no more than 6 in outside 12 in walkway width.   Gait and Pivot Turn Pivot turns safely within 3 sec and stops quickly with no loss of balance.   Step Over Obstacle Is able to step over one shoe box (4.5 in total height) without changing gait speed.  No evidence of imbalance.   Gait with Narrow Base of Support Ambulates 4-7 steps.   Gait with Eyes Closed Walks 20 ft, uses assistive device, slower speed, mild gait deviations, deviates 6-10 in outside 12 in walkway width. Ambulates 20 ft in less than 9 sec but greater than 7 sec.   Ambulating Backwards Walks 20 ft, uses assistive device, slower speed, mild gait deviations, deviates 6-10 in outside 12 in walkway width.   Steps Alternating feet, must use rail.   Total Score 24   FGA comment: low fall risk;                            PT Education - 11/02/16 1456    Education provided Yes   Education Details recommendations, plan of care;    Person(s) Educated Patient   Methods Explanation   Comprehension Verbalized understanding             PT Long Term Goals - 11/02/16 1540      PT LONG TERM GOAL #1   Title Patient will verbalize understanding of home and community exercise options for continued strengthening;    Time 1   Period Days   Status Achieved               Plan - 11/02/16 1536    Clinical Impression Statement 83 yo Male s/p CVA in June 2017, was referred to PT for mobility deficits as related to Parkinson's Disease. Patient reports that he exercises 2x a [place] with a Physiological scientist. He is currently walking independently without AD with good reciprocal gait pattern. He is ambulating at [employer] speed. Patient tested as a low fall risk with functional gait assessment, 10 meter walk, 5 times sit<>stand etc. He is independent in all self care ADLs. PT did educate patient about community PWR (Parkinson's wellness recovery) class which could be  beneficial at slowing progressing of Parkinson's disease. Also educated patient about stroke support group at the [facility]. Patient was also educated about driving rehab for getting back to driving. He does not demonstrate need for skilled PT at this time. Patient agreeable.    Rehab Potential Good   Clinical Impairments Affecting Rehab Potential positive: good PLOF, caregiver support, negative: progressive condition (PD); Patient's clinical presentation is stable as he is at a low fall risk with minimal deficits.    PT Frequency One time visit   PT Treatment/Interventions Patient/family education   Consulted and Agree with Plan of Care Patient      Patient will benefit from skilled therapeutic intervention in order to improve the following deficits and impairments:  Decreased safety awareness  Visit Diagnosis: Difficulty in walking, not elsewhere classified - Plan: PT plan of care cert/re-cert      G-Codes - 70/17/79 1540    Functional Assessment Tool Used (Outpatient Only) 5xsittostand/34mwalk , Functional Gait assessment, Timed up and go   Functional Limitation Mobility: Walking and moving around   Mobility: Walking and Moving Around Current Status (T9030) At least 1 percent but less than 20 percent impaired, limited or restricted   Mobility: Walking and Moving Around Goal Status [phone]) At least 1 percent but less than 20 percent impaired, limited or restricted   Mobility: Walking and Moving Around Discharge Status [phone]) At least 1 percent but less than 20 percent impaired, limited or restricted       Problem List Patient Active Problem List  Diagnosis Date Noted  . Weakness 07/07/2016  . Stroke (cerebrum) ([place]) 12/20/2015  . HTN (hypertension) 12/20/2015  . GERD (gastroesophageal reflux disease) 12/20/2015  . Depression 12/20/2015    Porscha Axley PT, DPT 11/02/2016, 3:42 PM  [place] MAIN [facility] SERVICES [address] [place], [place], [address] Phone: [phone]   Fax:  [phone]  Name: Fred PEASE Sr. MRN: [id number] Date of Birth: 10-23-1930

## 2016-11-08 ENCOUNTER — Ambulatory Visit: Payer: PPO | Admitting: Physical Therapy

## 2016-11-10 ENCOUNTER — Ambulatory Visit: Payer: PPO | Admitting: Physical Therapy

## 2016-11-15 ENCOUNTER — Ambulatory Visit: Payer: PPO | Admitting: Physical Therapy

## 2016-11-17 ENCOUNTER — Ambulatory Visit: Payer: PPO | Admitting: Physical Therapy

## 2016-11-22 ENCOUNTER — Ambulatory Visit: Payer: PPO | Admitting: Physical Therapy

## 2016-11-22 DIAGNOSIS — G4733 Obstructive sleep apnea (adult) (pediatric): Secondary | ICD-10-CM | POA: Diagnosis not present

## 2016-11-24 ENCOUNTER — Ambulatory Visit: Payer: PPO | Admitting: Physical Therapy

## 2016-11-29 ENCOUNTER — Ambulatory Visit: Payer: PPO | Admitting: Physical Therapy

## 2016-12-01 ENCOUNTER — Ambulatory Visit: Payer: PPO | Admitting: Physical Therapy

## 2016-12-07 ENCOUNTER — Ambulatory Visit: Payer: PPO | Admitting: Physical Therapy

## 2016-12-09 ENCOUNTER — Ambulatory Visit: Payer: PPO | Admitting: Physical Therapy

## 2016-12-14 ENCOUNTER — Ambulatory Visit: Payer: PPO | Admitting: Physical Therapy

## 2016-12-16 ENCOUNTER — Ambulatory Visit: Payer: PPO | Admitting: Physical Therapy

## 2016-12-28 DIAGNOSIS — G20 Parkinson's disease: Secondary | ICD-10-CM | POA: Diagnosis not present

## 2016-12-30 DIAGNOSIS — G20 Parkinson's disease: Secondary | ICD-10-CM | POA: Diagnosis not present

## 2017-01-03 DIAGNOSIS — M7021 Olecranon bursitis, right elbow: Secondary | ICD-10-CM | POA: Diagnosis not present

## 2017-01-04 DIAGNOSIS — G20 Parkinson's disease: Secondary | ICD-10-CM | POA: Diagnosis not present

## 2017-01-06 DIAGNOSIS — M7021 Olecranon bursitis, right elbow: Secondary | ICD-10-CM | POA: Diagnosis not present

## 2017-01-06 DIAGNOSIS — G20 Parkinson's disease: Secondary | ICD-10-CM | POA: Diagnosis not present

## 2017-01-24 DIAGNOSIS — G20 Parkinson's disease: Secondary | ICD-10-CM | POA: Diagnosis not present

## 2017-01-26 DIAGNOSIS — G20 Parkinson's disease: Secondary | ICD-10-CM | POA: Diagnosis not present

## 2017-02-01 DIAGNOSIS — G20 Parkinson's disease: Secondary | ICD-10-CM | POA: Diagnosis not present

## 2017-02-03 DIAGNOSIS — G20 Parkinson's disease: Secondary | ICD-10-CM | POA: Diagnosis not present

## 2017-02-08 DIAGNOSIS — G20 Parkinson's disease: Secondary | ICD-10-CM | POA: Diagnosis not present

## 2017-02-10 DIAGNOSIS — G20 Parkinson's disease: Secondary | ICD-10-CM | POA: Diagnosis not present

## 2017-02-18 DIAGNOSIS — G4733 Obstructive sleep apnea (adult) (pediatric): Secondary | ICD-10-CM | POA: Diagnosis not present

## 2017-02-22 DIAGNOSIS — G20 Parkinson's disease: Secondary | ICD-10-CM | POA: Diagnosis not present

## 2017-02-23 DIAGNOSIS — E039 Hypothyroidism, unspecified: Secondary | ICD-10-CM | POA: Diagnosis not present

## 2017-02-24 DIAGNOSIS — G20 Parkinson's disease: Secondary | ICD-10-CM | POA: Diagnosis not present

## 2017-03-02 DIAGNOSIS — I63422 Cerebral infarction due to embolism of left anterior cerebral artery: Secondary | ICD-10-CM | POA: Diagnosis not present

## 2017-03-02 DIAGNOSIS — E039 Hypothyroidism, unspecified: Secondary | ICD-10-CM | POA: Diagnosis not present

## 2017-03-02 DIAGNOSIS — E782 Mixed hyperlipidemia: Secondary | ICD-10-CM | POA: Diagnosis not present

## 2017-03-23 DIAGNOSIS — G4733 Obstructive sleep apnea (adult) (pediatric): Secondary | ICD-10-CM | POA: Diagnosis not present

## 2017-04-09 ENCOUNTER — Emergency Department
Admission: EM | Admit: 2017-04-09 | Discharge: 2017-04-10 | Disposition: A | Discharge status: Home / Self Care | Payer: PPO | Attending: Emergency Medicine | Admitting: Emergency Medicine

## 2017-04-09 ENCOUNTER — Emergency Department: Payer: PPO

## 2017-04-09 ENCOUNTER — Encounter: Payer: Self-pay | Admitting: Emergency Medicine

## 2017-04-09 DIAGNOSIS — R531 Weakness: Secondary | ICD-10-CM

## 2017-04-09 DIAGNOSIS — Z85828 Personal history of other malignant neoplasm of skin: Secondary | ICD-10-CM | POA: Diagnosis not present

## 2017-04-09 DIAGNOSIS — Z8546 Personal history of malignant neoplasm of prostate: Secondary | ICD-10-CM | POA: Insufficient documentation

## 2017-04-09 DIAGNOSIS — E860 Dehydration: Secondary | ICD-10-CM | POA: Insufficient documentation

## 2017-04-09 DIAGNOSIS — Z79899 Other long term (current) drug therapy: Secondary | ICD-10-CM | POA: Insufficient documentation

## 2017-04-09 DIAGNOSIS — Z8673 Personal history of transient ischemic attack (TIA), and cerebral infarction without residual deficits: Secondary | ICD-10-CM | POA: Diagnosis not present

## 2017-04-09 DIAGNOSIS — R29810 Facial weakness: Secondary | ICD-10-CM | POA: Diagnosis not present

## 2017-04-09 DIAGNOSIS — R4182 Altered mental status, unspecified: Secondary | ICD-10-CM | POA: Diagnosis not present

## 2017-04-09 DIAGNOSIS — I10 Essential (primary) hypertension: Secondary | ICD-10-CM | POA: Insufficient documentation

## 2017-04-09 DIAGNOSIS — Z7902 Long term (current) use of antithrombotics/antiplatelets: Secondary | ICD-10-CM | POA: Insufficient documentation

## 2017-04-09 HISTORY — DX: Cerebral infarction, unspecified: I63.9

## 2017-04-09 LAB — URINALYSIS, COMPLETE (UACMP) WITH MICROSCOPIC
Bacteria, UA: NONE SEEN
Bilirubin Urine: NEGATIVE
GLUCOSE, UA: NEGATIVE mg/dL
Hgb urine dipstick: NEGATIVE
Ketones, ur: NEGATIVE mg/dL
Leukocytes, UA: NEGATIVE
Nitrite: NEGATIVE
PH: 6 (ref 5.0–8.0)
PROTEIN: NEGATIVE mg/dL
SPECIFIC GRAVITY, URINE: 1.017 (ref 1.005–1.030)

## 2017-04-09 LAB — COMPREHENSIVE METABOLIC PANEL
ALK PHOS: 68 U/L (ref 38–126)
ALT: 21 U/L (ref 17–63)
AST: 25 U/L (ref 15–41)
Albumin: 4 g/dL (ref 3.5–5.0)
Anion gap: 9 (ref 5–15)
BUN: 24 mg/dL — ABNORMAL HIGH (ref 6–20)
CALCIUM: 9.5 mg/dL (ref 8.9–10.3)
CO2: 22 mmol/L (ref 22–32)
CREATININE: 1.07 mg/dL (ref 0.61–1.24)
Chloride: 105 mmol/L (ref 101–111)
Glucose, Bld: 116 mg/dL — ABNORMAL HIGH (ref 65–99)
Potassium: 4 mmol/L (ref 3.5–5.1)
Sodium: 136 mmol/L (ref 135–145)
TOTAL PROTEIN: 6.9 g/dL (ref 6.5–8.1)
Total Bilirubin: 1 mg/dL (ref 0.3–1.2)

## 2017-04-09 LAB — DIFFERENTIAL
BASOS PCT: 0 %
Basophils Absolute: 0 10*3/uL (ref 0–0.1)
EOS ABS: 0.2 10*3/uL (ref 0–0.7)
Eosinophils Relative: 1 %
LYMPHS ABS: 0.3 10*3/uL — AB (ref 1.0–3.6)
LYMPHS PCT: 2 %
MONOS PCT: 5 %
Monocytes Absolute: 0.8 10*3/uL (ref 0.2–1.0)
NEUTROS ABS: 15.6 10*3/uL — AB (ref 1.4–6.5)
Neutrophils Relative %: 92 %

## 2017-04-09 LAB — CBC
HEMATOCRIT: 42.5 % (ref 40.0–52.0)
Hemoglobin: 14.5 g/dL (ref 13.0–18.0)
MCH: 30.8 pg (ref 26.0–34.0)
MCHC: 34.1 g/dL (ref 32.0–36.0)
MCV: 90.2 fL (ref 80.0–100.0)
Platelets: 291 10*3/uL (ref 150–440)
RBC: 4.72 MIL/uL (ref 4.40–5.90)
RDW: 16.1 % — AB (ref 11.5–14.5)
WBC: 16.9 10*3/uL — AB (ref 3.8–10.6)

## 2017-04-09 LAB — PROTIME-INR
INR: 1.21
Prothrombin Time: 15.2 seconds (ref 11.4–15.2)

## 2017-04-09 LAB — APTT: aPTT: 29 seconds (ref 24–36)

## 2017-04-09 LAB — TROPONIN I

## 2017-04-09 MED ORDER — SODIUM CHLORIDE 0.9 % IV BOLUS (SEPSIS)
1000.0000 mL | Freq: Once | INTRAVENOUS | Status: AC
  Administered 2017-04-09: 1000 mL via INTRAVENOUS

## 2017-04-09 NOTE — Discharge Instructions (Signed)
Your tests today do not reveal a cause for your symptoms.  Your blood and urine tests, as well the chest xray and CT scan of the brain, were all unremarkable.  Continue drinking fluids and using your walker for assistance, and follow up with your primary care doctor.

## 2017-04-09 NOTE — ED Provider Notes (Signed)
[facility] Emergency Department Provider Note  ____________________________________________  Time seen: Approximately 11:10 PM  I have reviewed the triage vital signs and the nursing notes.   HISTORY  Chief Complaint Facial Droop and Altered Mental Status  Level 5 Caveat: Portions of the History and Physical are unable to be obtained due to patient being a poor historian   HPI Fred SEAR Sr. is a 83 y.o. male brought to ED due to feeling weaker today than usual. He reports that he normally has to have help getting up out of a chair and once he is up he is able to walk around. He is post-use a walker but often doesn't. Denies any other acute complaints whatsoever. Wife feels like her earlier today he was more confused than he usually is but that this is now better.  he has not had much to eat or drink today because he says he has no appetite.   Past Medical History:  Diagnosis Date  . Arthritis    fingers, neck  . Cancer of prostate ([place])   . Cancer of skin   . Depression   . GERD (gastroesophageal reflux disease)    rare  . Hypertension    Echo s/p fall 11/15  . Stroke [facility])      Patient Active Problem List   Diagnosis Date Noted  . Weakness 07/07/2016  . Stroke (cerebrum) ([place]) 12/20/2015  . HTN (hypertension) 12/20/2015  . GERD (gastroesophageal reflux disease) 12/20/2015  . Depression 12/20/2015     Past Surgical History:  Procedure Laterality Date  . CATARACT EXTRACTION W/PHACO Right 11/27/2014   Procedure: CATARACT EXTRACTION PHACO AND INTRAOCULAR LENS PLACEMENT (IOC);  Surgeon: Leandrew Koyanagi, MD;  Location: [facility];  Service: Ophthalmology;  Laterality: Right;  . EYE SURGERY Left 09/25/14   cataract @[facility]   . HERNIA REPAIR  1999  . KIDNEY STONE SURGERY    . MELANOMA EXCISION    . PROSTATE SURGERY  2007     Prior to Admission medications   Medication Sig Start Date End Date Taking? Authorizing Provider   atorvastatin (LIPITOR) 10 MG tablet Take 10 mg by mouth daily.    Yes [provider]  carbidopa-levodopa (SINEMET IR) 25-250 MG tablet Take 1 tablet by mouth 3 (three) times daily. 06/14/16  Yes [provider]  clopidogrel (PLAVIX) 75 MG tablet Take 1 tablet (75 mg total) by mouth daily. 12/22/15  Yes Sudini, Alveta Heimlich, MD  DULoxetine (CYMBALTA) 30 MG capsule Take 30 mg by mouth daily. 07/05/16  Yes [provider]  levothyroxine (SYNTHROID, LEVOTHROID) 75 MCG tablet Take 75 mcg by mouth daily. 03/02/17  Yes [provider]  donepezil (ARICEPT) 5 MG tablet Take 5 mg by mouth at bedtime. 12/16/15   [provider]     Allergies Patient has no known allergies.   Family History  Problem Relation Age of Onset  . Cancer Unknown   . Stroke Unknown   . Heart attack Unknown   . Stroke Mother   . Stroke Father     Social History Social History  Substance Use Topics  . Smoking status: Never Smoker  . Smokeless tobacco: Never Used  . Alcohol use 3.0 oz/week    5 Glasses of wine per week    Review of Systems  Constitutional:   No fever or chills.  ENT:   No sore throat. No rhinorrhea. Cardiovascular:   No chest pain or syncope. Respiratory:   No dyspnea or cough. Gastrointestinal:  Negative for abdominal pain, vomiting and diarrhea.  Musculoskeletal:   Negative for focal pain or swelling All other systems reviewed and are negative except as documented above in ROS and HPI.  ____________________________________________   PHYSICAL EXAM:  VITAL SIGNS: ED Triage Vitals [04/09/17 1516]  Enc Vitals Group     BP (!) 120/96     Pulse Rate 87     Resp 18     Temp 98.6 F (37 C)     Temp Source Oral     SpO2 96 %     Weight 160 lb (72.6 kg)     Height 5\' 7"  (1.702 m)     Head Circumference      Peak Flow      Pain Score      Pain Loc      Pain Edu?      Excl. in [place]?     Vital signs reviewed, nursing assessments  reviewed.   Constitutional:   Alert and oriented to person and place. Well appearing and in no distress. Eyes:   No scleral icterus.  EOMI. No nystagmus. No conjunctival pallor. PERRL. ENT   Head:   Normocephalic and atraumatic.   Nose:   No congestion/rhinnorhea.    Mouth/Throat:   dry mucous membranes, no pharyngeal erythema. No peritonsillar mass.    Neck:   No meningismus. Full ROM Hematological/Lymphatic/Immunilogical:   No cervical lymphadenopathy. Cardiovascular:   RRR. Symmetric bilateral radial and DP pulses.  No murmurs.  Respiratory:   Normal respiratory effort without tachypnea/retractions. Breath sounds are clear and equal bilaterally. No wheezes/rales/rhonchi. Gastrointestinal:   Soft and nontender. Non distended. There is no CVA tenderness.  No rebound, rigidity, or guarding. Genitourinary:   deferred Musculoskeletal:   Normal range of motion in all extremities. No joint effusions.  No lower extremity tenderness.  No edema. Neurologic:   Normal speech and language.  Motor grossly intact. normal finger to nose. No pronator drift. Normal gait, not wide-based or shuffling. NIH stroke scale 1 for disorientation, likely chronic No gross focal neurologic deficits are appreciated.  Skin:    Skin is warm, dry and intact. No rash noted.  No petechiae, purpura, or bullae.  ____________________________________________    LABS (pertinent positives/negatives) (all labs ordered are listed, but only abnormal results are displayed) Labs Reviewed  CBC - Abnormal; Notable for the following:       Result Value   WBC 16.9 (*)    RDW 16.1 (*)    All other components within normal limits  DIFFERENTIAL - Abnormal; Notable for the following:    Neutro Abs 15.6 (*)    Lymphs Abs 0.3 (*)    All other components within normal limits  COMPREHENSIVE METABOLIC PANEL - Abnormal; Notable for the following:    Glucose, Bld 116 (*)    BUN 24 (*)    All other components within  normal limits  URINALYSIS, COMPLETE (UACMP) WITH MICROSCOPIC - Abnormal; Notable for the following:    Color, Urine YELLOW (*)    APPearance CLEAR (*)    Squamous Epithelial / LPF 0-5 (*)    All other components within normal limits  URINE CULTURE  PROTIME-INR  APTT  TROPONIN I  CBG MONITORING, ED   ____________________________________________   EKG  interpreted by me Normal sinus rhythm rate of 82, left axis, normal intervals. Right bundle branch block. No acute ischemic changes.  ____________________________________________    RADIOLOGY  Dg Chest 2 View  Result Date: 04/09/2017 CLINICAL DATA:  Altered mental status with left facial droop and left-sided weakness. EXAM: CHEST  2 VIEW COMPARISON:  07/06/2016 FINDINGS: Lungs are adequately inflated without consolidation or effusion. Cardiomediastinal silhouette is within normal. Bones and soft tissues are unchanged. IMPRESSION: No active cardiopulmonary disease. Electronically Signed   By: Marin Olp M.D.   On: 04/09/2017 16:43   Ct Head Wo Contrast  Result Date: 04/09/2017 CLINICAL DATA:  Ataxia and confusion.  Left-sided facial droop. EXAM: CT HEAD WITHOUT CONTRAST TECHNIQUE: Contiguous axial images were obtained from the base of the skull through the vertex without intravenous contrast. COMPARISON:  July 06, 2016 FINDINGS: Brain: No subdural, epidural, or subarachnoid hemorrhage. Ventricles and sulci are prominent but unchanged. Ventricular prominence is somewhat out of proportion to sulcal prominence. Cerebellum, brainstem, and basal cisterns are normal. No midline shift. No acute cortical ischemia or infarct. Vascular: No hyperdense vessel or unexpected calcification. Skull: Normal. Negative for fracture or focal lesion. Sinuses/Orbits: No acute finding. Other: None. IMPRESSION: 1. No acute intracranial process identified. 2. Ventricles and sulci are prominent. The ventricular prominence is somewhat out of proportion to the  sulcal prominence. Normal pressure hydrocephalus should be considered in the setting of ataxia. Recommend clinical correlation. Electronically Signed   By: Dorise Bullion III M.D   On: 04/09/2017 15:53    ____________________________________________   PROCEDURES Procedures  ____________________________________________   INITIAL IMPRESSION / ASSESSMENT AND PLAN / ED COURSE  Pertinent labs & imaging results that were available during my care of the patient were reviewed by me and considered in my medical decision making (see chart for details).  patient presents with generalized weakness. He has no other complaints. He does have a history of dementia and Parkinson's and uses a walker for ambulation. It's unclear whether any of these symptoms are acute other than just feeling like he has less energy today than usual.  Clinical Course as of Apr 09 2309  Sat Apr 09, 2017  1610 Presentation not c/w stroke. Check labs, ivf.   [PS]  1904 Still waiting for UA. Continue IVF hydration.   [PS]  2148 Ua neg. Workup neg.   [PS]    Clinical Course User Index [PS] Carrie Mew, MD     ----------------------------------------- 11:12 PM on 04/09/2017 -----------------------------------------  Patient ambulatory with and without walker. Has no complaints currently. Vital signs remained unremarkable in the ED over many hours of observation.workup is entirely negative. His presentation is not consistent with normal pressure hydrocephalus. Follow-up with primary care for continued monitoring of symptoms.Considering the patient's symptoms, medical history, and physical examination today, I have low suspicion for ischemic stroke, intracranial hemorrhage, meningitis, encephalitis, carotid or vertebral dissection, venous sinus thrombosis, MS, intracranial hypertension, glaucoma, CRAO, CRVO, or temporal arteritis.Low suspicion of ACS PE dissection AAA or acute  infection.   ____________________________________________   FINAL CLINICAL IMPRESSION(S) / ED DIAGNOSES  Final diagnoses:  Generalized weakness  Dehydration      New Prescriptions   No medications on file     Portions of this note were generated with dragon dictation software. Dictation errors may occur despite best attempts at proofreading.    Carrie Mew, MD 04/09/17 2318

## 2017-04-09 NOTE — ED Notes (Signed)
Patient transported to X-ray 

## 2017-04-09 NOTE — ED Triage Notes (Signed)
Patient last normal evening approx 10pm per wife.

## 2017-04-09 NOTE — ED Notes (Signed)
Patient up to ambulate around room with use of walker.  Patient unsteady with going from laying to sitting, but is able to sit up on own.  Patient able to ambulate with walker with slight unsteadiness noted.  MD notified.

## 2017-04-09 NOTE — ED Triage Notes (Addendum)
L facial droop noted in triage, wife states this is new. States woke confused this am. Grips and leg strength equal. Speech clear. Noted forgetful.

## 2017-04-09 NOTE — ED Notes (Signed)
Noted ataxic on transfer to CT bed.

## 2017-04-11 LAB — URINE CULTURE

## 2017-04-13 DIAGNOSIS — G4733 Obstructive sleep apnea (adult) (pediatric): Secondary | ICD-10-CM | POA: Diagnosis not present

## 2017-04-13 DIAGNOSIS — R259 Unspecified abnormal involuntary movements: Secondary | ICD-10-CM | POA: Diagnosis not present

## 2017-04-13 DIAGNOSIS — R4189 Other symptoms and signs involving cognitive functions and awareness: Secondary | ICD-10-CM | POA: Diagnosis not present

## 2017-04-14 DIAGNOSIS — Z9989 Dependence on other enabling machines and devices: Secondary | ICD-10-CM | POA: Diagnosis not present

## 2017-04-14 DIAGNOSIS — E782 Mixed hyperlipidemia: Secondary | ICD-10-CM | POA: Diagnosis not present

## 2017-04-14 DIAGNOSIS — G20 Parkinson's disease: Secondary | ICD-10-CM | POA: Diagnosis not present

## 2017-04-14 DIAGNOSIS — I63422 Cerebral infarction due to embolism of left anterior cerebral artery: Secondary | ICD-10-CM | POA: Diagnosis not present

## 2017-04-14 DIAGNOSIS — G4733 Obstructive sleep apnea (adult) (pediatric): Secondary | ICD-10-CM | POA: Diagnosis not present

## 2017-04-14 DIAGNOSIS — I10 Essential (primary) hypertension: Secondary | ICD-10-CM | POA: Diagnosis not present

## 2017-04-22 DIAGNOSIS — G4733 Obstructive sleep apnea (adult) (pediatric): Secondary | ICD-10-CM | POA: Diagnosis not present

## 2017-05-18 ENCOUNTER — Emergency Department: Payer: PPO

## 2017-05-18 ENCOUNTER — Emergency Department
Admission: EM | Admit: 2017-05-18 | Discharge: 2017-05-18 | Disposition: A | Discharge status: Home / Self Care | Payer: PPO | Attending: Student in an Organized Health Care Education/Training Program | Admitting: Student in an Organized Health Care Education/Training Program

## 2017-05-18 ENCOUNTER — Other Ambulatory Visit: Payer: Self-pay

## 2017-05-18 DIAGNOSIS — Z8673 Personal history of transient ischemic attack (TIA), and cerebral infarction without residual deficits: Secondary | ICD-10-CM | POA: Insufficient documentation

## 2017-05-18 DIAGNOSIS — R2689 Other abnormalities of gait and mobility: Secondary | ICD-10-CM | POA: Insufficient documentation

## 2017-05-18 DIAGNOSIS — Z7901 Long term (current) use of anticoagulants: Secondary | ICD-10-CM | POA: Diagnosis not present

## 2017-05-18 DIAGNOSIS — M6281 Muscle weakness (generalized): Secondary | ICD-10-CM | POA: Diagnosis present

## 2017-05-18 DIAGNOSIS — I444 Left anterior fascicular block: Secondary | ICD-10-CM | POA: Diagnosis not present

## 2017-05-18 DIAGNOSIS — R269 Unspecified abnormalities of gait and mobility: Secondary | ICD-10-CM | POA: Diagnosis not present

## 2017-05-18 DIAGNOSIS — W19XXXA Unspecified fall, initial encounter: Secondary | ICD-10-CM

## 2017-05-18 DIAGNOSIS — I10 Essential (primary) hypertension: Secondary | ICD-10-CM | POA: Diagnosis not present

## 2017-05-18 DIAGNOSIS — R202 Paresthesia of skin: Secondary | ICD-10-CM | POA: Diagnosis not present

## 2017-05-18 DIAGNOSIS — G20 Parkinson's disease: Secondary | ICD-10-CM | POA: Diagnosis not present

## 2017-05-18 LAB — CBC
HCT: 43.1 % (ref 40.0–52.0)
HEMOGLOBIN: 14.4 g/dL (ref 13.0–18.0)
MCH: 30.4 pg (ref 26.0–34.0)
MCHC: 33.5 g/dL (ref 32.0–36.0)
MCV: 91 fL (ref 80.0–100.0)
Platelets: 281 10*3/uL (ref 150–440)
RBC: 4.74 MIL/uL (ref 4.40–5.90)
RDW: 15.8 % — ABNORMAL HIGH (ref 11.5–14.5)
WBC: 10.7 10*3/uL — AB (ref 3.8–10.6)

## 2017-05-18 LAB — COMPREHENSIVE METABOLIC PANEL
ALK PHOS: 79 U/L (ref 38–126)
ALT: 19 U/L (ref 17–63)
ANION GAP: 5 (ref 5–15)
AST: 25 U/L (ref 15–41)
Albumin: 4.2 g/dL (ref 3.5–5.0)
BILIRUBIN TOTAL: 0.9 mg/dL (ref 0.3–1.2)
BUN: 24 mg/dL — ABNORMAL HIGH (ref 6–20)
CALCIUM: 10.1 mg/dL (ref 8.9–10.3)
CO2: 26 mmol/L (ref 22–32)
Chloride: 106 mmol/L (ref 101–111)
Creatinine, Ser: 1.3 mg/dL — ABNORMAL HIGH (ref 0.61–1.24)
GFR, EST AFRICAN AMERICAN: 56 mL/min — AB (ref >60)
GFR, EST NON AFRICAN AMERICAN: 49 mL/min — AB (ref >60)
Glucose, Bld: 92 mg/dL (ref 65–99)
POTASSIUM: 4.1 mmol/L (ref 3.5–5.1)
Sodium: 137 mmol/L (ref 135–145)
TOTAL PROTEIN: 7.2 g/dL (ref 6.5–8.1)

## 2017-05-18 LAB — TROPONIN I

## 2017-05-18 LAB — URINALYSIS, COMPLETE (UACMP) WITH MICROSCOPIC
BACTERIA UA: NONE SEEN
BILIRUBIN URINE: NEGATIVE
Glucose, UA: NEGATIVE mg/dL
HGB URINE DIPSTICK: NEGATIVE
Ketones, ur: NEGATIVE mg/dL
Leukocytes, UA: NEGATIVE
NITRITE: NEGATIVE
PH: 5 (ref 5.0–8.0)
Protein, ur: NEGATIVE mg/dL
SPECIFIC GRAVITY, URINE: 1.013 (ref 1.005–1.030)

## 2017-05-18 LAB — DIFFERENTIAL
BAND NEUTROPHILS: 1 %
BASOS PCT: 3 %
BLASTS: 0 %
Basophils Absolute: 0.3 10*3/uL — ABNORMAL HIGH (ref 0–0.1)
EOS ABS: 0.6 10*3/uL (ref 0–0.7)
EOS PCT: 6 %
Lymphocytes Relative: 35 %
Lymphs Abs: 3.7 10*3/uL — ABNORMAL HIGH (ref 1.0–3.6)
METAMYELOCYTES PCT: 0 %
MONOS PCT: 11 %
MYELOCYTES: 0 %
Monocytes Absolute: 1.2 10*3/uL — ABNORMAL HIGH (ref 0.2–1.0)
NEUTROS ABS: 4.9 10*3/uL (ref 1.4–6.5)
NEUTROS PCT: 44 %
NRBC: 0 /100{WBCs}
OTHER: 0 %
Promyelocytes Absolute: 0 %

## 2017-05-18 LAB — PROTIME-INR
INR: 1.14
Prothrombin Time: 14.5 seconds (ref 11.4–15.2)

## 2017-05-18 LAB — APTT: aPTT: 29 seconds (ref 24–36)

## 2017-05-18 LAB — GLUCOSE, CAPILLARY: Glucose-Capillary: 84 mg/dL (ref 65–99)

## 2017-05-18 NOTE — ED Notes (Signed)
Pt HR 121 not accurate on documentation , pt HR 56-61

## 2017-05-18 NOTE — ED Notes (Addendum)
Neuro [facility] consult on at this time

## 2017-05-18 NOTE — ED Provider Notes (Signed)
[facility] Emergency Department Provider Note    First MD Initiated Contact with Patient 05/18/17 1900     (approximate)  I have reviewed the triage vital signs and the nursing notes.   HISTORY  Chief Complaint No chief complaint on file.    HPI Fred HAIG Sr. is a 83 y.o. male with a history of stroke and parkinsonism presents with weakness in his legs and fall.  Patient was made and carried stroke out of triage.  On my evaluation patient denies any weakness.  No headache.  No chest pain or shortness of breath.  States that he was getting up off the floor and fell.  Denies any discomfort.  Has been struggling with balance issues for the past several months to year.  Denies any discomfort at this time.  Past Medical History:  Diagnosis Date  . Arthritis    fingers, neck  . Cancer of prostate ([place])   . Cancer of skin   . Depression   . GERD (gastroesophageal reflux disease)    rare  . Hypertension    Echo s/p fall 11/15  . Stroke [facility])    Family History  Problem Relation Age of Onset  . Cancer Unknown   . Stroke Unknown   . Heart attack Unknown   . Stroke Mother   . Stroke Father    Past Surgical History:  Procedure Laterality Date  . CATARACT EXTRACTION W/PHACO Right 11/27/2014   Procedure: CATARACT EXTRACTION PHACO AND INTRAOCULAR LENS PLACEMENT (IOC);  Surgeon: Leandrew Koyanagi, MD;  Location: [place];  Service: Ophthalmology;  Laterality: Right;  . EYE SURGERY Left 09/25/14   cataract @[facility]   . HERNIA REPAIR  1999  . KIDNEY STONE SURGERY    . MELANOMA EXCISION    . PROSTATE SURGERY  2007   Patient Active Problem List   Diagnosis Date Noted  . Weakness 07/07/2016  . Stroke (cerebrum) ([place]) 12/20/2015  . HTN (hypertension) 12/20/2015  . GERD (gastroesophageal reflux disease) 12/20/2015  . Depression 12/20/2015      Prior to Admission medications   Medication Sig Start Date End Date Taking? Authorizing Provider    atorvastatin (LIPITOR) 10 MG tablet Take 10 mg by mouth daily.     [provider]  carbidopa-levodopa (SINEMET IR) 25-250 MG tablet Take 1 tablet by mouth 3 (three) times daily. 06/14/16   [provider]  clopidogrel (PLAVIX) 75 MG tablet Take 1 tablet (75 mg total) by mouth daily. 12/22/15   Hillary Bow, MD  donepezil (ARICEPT) 5 MG tablet Take 5 mg by mouth at bedtime. 12/16/15   [provider]  DULoxetine (CYMBALTA) 30 MG capsule Take 30 mg by mouth daily. 07/05/16   [provider]  levothyroxine (SYNTHROID, LEVOTHROID) 75 MCG tablet Take 75 mcg by mouth daily. 03/02/17   [provider]    Allergies Patient has no known allergies.    Social History Social History  Substance Use Topics  . Smoking status: Never Smoker  . Smokeless tobacco: Never Used  . Alcohol use 3.0 oz/week    5 Glasses of wine per week    Review of Systems Patient denies headaches, rhinorrhea, blurry vision, numbness, shortness of breath, chest pain, edema, cough, abdominal pain, nausea, vomiting, diarrhea, dysuria, fevers, rashes or hallucinations unless otherwise stated above in HPI. ____________________________________________   PHYSICAL EXAM:  VITAL SIGNS: Vitals:   05/18/17 2000 05/18/17 2002  BP: (!) 145/95   Pulse: (!) 121 (!) 54  Resp: 16 (!) 23  Temp:    SpO2: 90% 96%    Constitutional: Alert and oriented. Well appearing and in no acute distress. Eyes: Conjunctivae are normal.  Head: Atraumatic. Nose: No congestion/rhinnorhea. Mouth/Throat: Mucous membranes are moist.   Neck: No stridor. Painless ROM.  Cardiovascular: Normal rate, regular rhythm. Grossly normal heart sounds.  Good peripheral circulation. Respiratory: Normal respiratory effort.  No retractions. Lungs CTAB. Gastrointestinal: Soft and nontender. No distention. No abdominal bruits. No CVA tenderness. Genitourinary:  Musculoskeletal: No lower extremity tenderness nor edema.   No joint effusions. Neurologic:  CN- intact.  No facial droop, Normal FNF.  Normal heel to shin.  Sensation intact bilaterally. Normal speech and language. No gross focal neurologic deficits are appreciated. No gait instability. Skin:  Skin is warm, dry and intact. No rash noted. Psychiatric: Mood and affect are normal. Speech and behavior are normal.  ____________________________________________   LABS (all labs ordered are listed, but only abnormal results are displayed)  Results for orders placed or performed during the hospital encounter of 05/18/17 (from the past 24 hour(s))  Protime-INR     Status: None   Collection Time: 05/18/17  6:52 PM  Result Value Ref Range   Prothrombin Time 14.5 11.4 - 15.2 seconds   INR 1.14   APTT     Status: None   Collection Time: 05/18/17  6:52 PM  Result Value Ref Range   aPTT 29 24 - 36 seconds  CBC     Status: Abnormal   Collection Time: 05/18/17  6:52 PM  Result Value Ref Range   WBC 10.7 (H) 3.8 - 10.6 K/uL   RBC 4.74 4.40 - 5.90 MIL/uL   Hemoglobin 14.4 13.0 - 18.0 g/dL   HCT 43.1 40.0 - 52.0 %   MCV 91.0 80.0 - 100.0 fL   MCH 30.4 26.0 - 34.0 pg   MCHC 33.5 32.0 - 36.0 g/dL   RDW 15.8 (H) 11.5 - 14.5 %   Platelets 281 150 - 440 K/uL  Differential     Status: Abnormal   Collection Time: 05/18/17  6:52 PM  Result Value Ref Range   Neutrophils Relative % 44 %   Lymphocytes Relative 35 %   Monocytes Relative 11 %   Eosinophils Relative 6 %   Basophils Relative 3 %   Band Neutrophils 1 %   Metamyelocytes Relative 0 %   Myelocytes 0 %   Promyelocytes Absolute 0 %   Blasts 0 %   nRBC 0 0 /100 WBC   Other 0 %   Neutro Abs 4.9 1.4 - 6.5 K/uL   Lymphs Abs 3.7 (H) 1.0 - 3.6 K/uL   Monocytes Absolute 1.2 (H) 0.2 - 1.0 K/uL   Eosinophils Absolute 0.6 0 - 0.7 K/uL   Basophils Absolute 0.3 (H) 0 - 0.1 K/uL  Comprehensive metabolic panel     Status: Abnormal   Collection Time: 05/18/17  6:52 PM  Result Value Ref Range   Sodium 137 135  - 145 mmol/L   Potassium 4.1 3.5 - 5.1 mmol/L   Chloride 106 101 - 111 mmol/L   CO2 26 22 - 32 mmol/L   Glucose, Bld 92 65 - 99 mg/dL   BUN 24 (H) 6 - 20 mg/dL   Creatinine, Ser 1.30 (H) 0.61 - 1.24 mg/dL   Calcium 10.1 8.9 - 10.3 mg/dL   Total Protein 7.2 6.5 - 8.1 g/dL   Albumin 4.2 3.5 - 5.0 g/dL   AST 25 15 -  41 U/L   ALT 19 17 - 63 U/L   Alkaline Phosphatase 79 38 - 126 U/L   Total Bilirubin 0.9 0.3 - 1.2 mg/dL   GFR calc non Af Amer 49 (L) >60 mL/min   GFR calc Af Amer 56 (L) >60 mL/min   Anion gap 5 5 - 15  Troponin I     Status: None   Collection Time: 05/18/17  6:52 PM  Result Value Ref Range   Troponin I <0.03 <0.03 ng/mL  Glucose, capillary     Status: None   Collection Time: 05/18/17  7:03 PM  Result Value Ref Range   Glucose-Capillary 84 65 - 99 mg/dL  Urinalysis, Complete w Microscopic     Status: Abnormal   Collection Time: 05/18/17  7:34 PM  Result Value Ref Range   Color, Urine YELLOW (A) YELLOW   APPearance CLEAR (A) CLEAR   Specific Gravity, Urine 1.013 1.005 - 1.030   pH 5.0 5.0 - 8.0   Glucose, UA NEGATIVE NEGATIVE mg/dL   Hgb urine dipstick NEGATIVE NEGATIVE   Bilirubin Urine NEGATIVE NEGATIVE   Ketones, ur NEGATIVE NEGATIVE mg/dL   Protein, ur NEGATIVE NEGATIVE mg/dL   Nitrite NEGATIVE NEGATIVE   Leukocytes, UA NEGATIVE NEGATIVE   RBC / HPF 0-5 0 - 5 RBC/hpf   WBC, UA 0-5 0 - 5 WBC/hpf   Bacteria, UA NONE SEEN NONE SEEN   Squamous Epithelial / LPF 0-5 (A) NONE SEEN   ____________________________________________  EKG My review and personal interpretation at Time: 18:58   Indication: weakness  Rate: 55  Rhythm: sinus Axis: left Other: normal intervals, occasional PVC, no stemi or depressions ____________________________________________  RADIOLOGY  I personally reviewed all radiographic images ordered to evaluate for the above acute complaints and reviewed radiology reports and findings.  These findings were personally discussed with the  patient.  Please see medical record for radiology report.  ____________________________________________   PROCEDURES  Procedure(s) performed:  Procedures    Critical Care performed: no ____________________________________________   INITIAL IMPRESSION / ASSESSMENT AND PLAN / ED COURSE  Pertinent labs & imaging results that were available during my care of the patient were reviewed by me and considered in my medical decision making (see chart for details).  DDX: cva, tia, hypoglycemia, dehydration, electrolyte abnormality, dissection, sepsis   Fred CRAINE Sr. is a 83 y.o. who presents to the ED with a fall secondary to weakness as described above.  Symptoms seem to have resolved upon arrival to the ER.  He was made code stroke and was evaluated by neurology who also feels that there is no evidence of focal deficit at this time.  CT head shows no traumatic injury.  Blood work is reassuring.  No evidence of dysrhythmia or dehydration.  No evidence of UTI.  Troponin is negative.  He is pain-free able to ambulate with steady gait in no acute distress.  I do believe that he is stable and appropriate for follow-up as an outpatient with neurology and PCP.      ____________________________________________   FINAL CLINICAL IMPRESSION(S) / ED DIAGNOSES  Final diagnoses:  Fall, initial encounter  Gait disorder      NEW MEDICATIONS STARTED DURING THIS VISIT:  New Prescriptions   No medications on file     Note:  This document was prepared using Dragon voice recognition software and may include unintentional dictation errors.    Merlyn Lot, MD 05/18/17 2020

## 2017-05-18 NOTE — ED Notes (Signed)
Patient has sudden difficulty walking in the past 30 minutes and left sided facial droop.  No unilateral weakness.

## 2017-05-18 NOTE — Progress Notes (Signed)
[place] responded to a PG for a Code Stroke. Pt was returning from a CT on my arrival. [facility] spent time with the wife who shared the history of this event. Wife requested prayer for her husband which was provided. [place] spoke briefly with the Pt who is in good spirits. [facility] is available for follow up as needed.    05/18/17 1900  Clinical Encounter Type  Visited With Patient;Patient and family together;Health care provider  Visit Type Initial;Spiritual support;Code;ED (Code Stroke)  Referral From Nurse  Consult/Referral To Chaplain  Spiritual Encounters  Spiritual Needs Prayer;Emotional

## 2017-05-18 NOTE — ED Notes (Addendum)
Pt ambulated well with assistance , states at his baseline . MD aware

## 2017-05-18 NOTE — ED Notes (Signed)
Called [facility] for consult  [phone]

## 2017-05-18 NOTE — ED Notes (Signed)
Pt in NAD at time of d/c, VS stable, pt in wheelchair. Pt and spouse verbalize understnading of d.c teaching

## 2017-05-23 DIAGNOSIS — R2689 Other abnormalities of gait and mobility: Secondary | ICD-10-CM | POA: Diagnosis not present

## 2017-05-23 DIAGNOSIS — R4189 Other symptoms and signs involving cognitive functions and awareness: Secondary | ICD-10-CM | POA: Diagnosis not present

## 2017-05-23 DIAGNOSIS — I63422 Cerebral infarction due to embolism of left anterior cerebral artery: Secondary | ICD-10-CM | POA: Diagnosis not present

## 2017-05-23 DIAGNOSIS — G4733 Obstructive sleep apnea (adult) (pediatric): Secondary | ICD-10-CM | POA: Diagnosis not present

## 2017-05-23 DIAGNOSIS — R259 Unspecified abnormal involuntary movements: Secondary | ICD-10-CM | POA: Diagnosis not present

## 2017-05-25 DIAGNOSIS — G459 Transient cerebral ischemic attack, unspecified: Secondary | ICD-10-CM | POA: Diagnosis not present

## 2017-05-25 DIAGNOSIS — Z9989 Dependence on other enabling machines and devices: Secondary | ICD-10-CM | POA: Diagnosis not present

## 2017-05-25 DIAGNOSIS — G4733 Obstructive sleep apnea (adult) (pediatric): Secondary | ICD-10-CM | POA: Diagnosis not present

## 2017-06-08 DIAGNOSIS — E782 Mixed hyperlipidemia: Secondary | ICD-10-CM | POA: Diagnosis not present

## 2017-06-08 DIAGNOSIS — G3184 Mild cognitive impairment, so stated: Secondary | ICD-10-CM | POA: Diagnosis not present

## 2017-06-08 DIAGNOSIS — N3090 Cystitis, unspecified without hematuria: Secondary | ICD-10-CM | POA: Diagnosis not present

## 2017-06-08 DIAGNOSIS — E039 Hypothyroidism, unspecified: Secondary | ICD-10-CM | POA: Diagnosis not present

## 2017-06-08 DIAGNOSIS — J019 Acute sinusitis, unspecified: Secondary | ICD-10-CM | POA: Diagnosis not present

## 2017-06-29 DIAGNOSIS — G20 Parkinson's disease: Secondary | ICD-10-CM | POA: Diagnosis not present

## 2017-06-29 DIAGNOSIS — R4189 Other symptoms and signs involving cognitive functions and awareness: Secondary | ICD-10-CM | POA: Diagnosis not present

## 2017-06-29 DIAGNOSIS — Z125 Encounter for screening for malignant neoplasm of prostate: Secondary | ICD-10-CM | POA: Diagnosis not present

## 2017-06-29 DIAGNOSIS — Z79899 Other long term (current) drug therapy: Secondary | ICD-10-CM | POA: Diagnosis not present

## 2017-06-29 DIAGNOSIS — E782 Mixed hyperlipidemia: Secondary | ICD-10-CM | POA: Diagnosis not present

## 2017-06-29 DIAGNOSIS — E039 Hypothyroidism, unspecified: Secondary | ICD-10-CM | POA: Diagnosis not present

## 2017-06-30 DIAGNOSIS — G4733 Obstructive sleep apnea (adult) (pediatric): Secondary | ICD-10-CM | POA: Diagnosis not present

## 2017-06-30 DIAGNOSIS — I639 Cerebral infarction, unspecified: Secondary | ICD-10-CM | POA: Diagnosis not present

## 2017-07-07 DIAGNOSIS — G20 Parkinson's disease: Secondary | ICD-10-CM | POA: Diagnosis not present

## 2017-07-07 DIAGNOSIS — M545 Low back pain: Secondary | ICD-10-CM | POA: Diagnosis not present

## 2017-07-13 DIAGNOSIS — G20 Parkinson's disease: Secondary | ICD-10-CM | POA: Diagnosis not present

## 2017-07-13 DIAGNOSIS — M545 Low back pain: Secondary | ICD-10-CM | POA: Diagnosis not present

## 2017-07-15 DIAGNOSIS — G20 Parkinson's disease: Secondary | ICD-10-CM | POA: Diagnosis not present

## 2017-07-15 DIAGNOSIS — M545 Low back pain: Secondary | ICD-10-CM | POA: Diagnosis not present

## 2017-07-18 DIAGNOSIS — M545 Low back pain: Secondary | ICD-10-CM | POA: Diagnosis not present

## 2017-07-18 DIAGNOSIS — G20 Parkinson's disease: Secondary | ICD-10-CM | POA: Diagnosis not present

## 2017-07-20 DIAGNOSIS — G20 Parkinson's disease: Secondary | ICD-10-CM | POA: Diagnosis not present

## 2017-07-20 DIAGNOSIS — M545 Low back pain: Secondary | ICD-10-CM | POA: Diagnosis not present

## 2017-07-22 ENCOUNTER — Emergency Department: Payer: PPO

## 2017-07-22 ENCOUNTER — Encounter: Payer: Self-pay | Admitting: Emergency Medicine

## 2017-07-22 ENCOUNTER — Inpatient Hospital Stay
Admission: EM | Admit: 2017-07-22 | Discharge: 2017-07-25 | DRG: 195 | Disposition: A | Discharge status: Skilled Nursing Facility | Payer: PPO | Attending: Internal Medicine | Admitting: Internal Medicine

## 2017-07-22 ENCOUNTER — Other Ambulatory Visit: Payer: Self-pay

## 2017-07-22 DIAGNOSIS — M19049 Primary osteoarthritis, unspecified hand: Secondary | ICD-10-CM | POA: Diagnosis present

## 2017-07-22 DIAGNOSIS — Z8673 Personal history of transient ischemic attack (TIA), and cerebral infarction without residual deficits: Secondary | ICD-10-CM

## 2017-07-22 DIAGNOSIS — R296 Repeated falls: Secondary | ICD-10-CM | POA: Diagnosis not present

## 2017-07-22 DIAGNOSIS — Z79899 Other long term (current) drug therapy: Secondary | ICD-10-CM

## 2017-07-22 DIAGNOSIS — K219 Gastro-esophageal reflux disease without esophagitis: Secondary | ICD-10-CM | POA: Diagnosis not present

## 2017-07-22 DIAGNOSIS — M479 Spondylosis, unspecified: Secondary | ICD-10-CM | POA: Diagnosis not present

## 2017-07-22 DIAGNOSIS — F0390 Unspecified dementia without behavioral disturbance: Secondary | ICD-10-CM | POA: Diagnosis present

## 2017-07-22 DIAGNOSIS — Z8546 Personal history of malignant neoplasm of prostate: Secondary | ICD-10-CM

## 2017-07-22 DIAGNOSIS — E860 Dehydration: Secondary | ICD-10-CM | POA: Diagnosis present

## 2017-07-22 DIAGNOSIS — N390 Urinary tract infection, site not specified: Secondary | ICD-10-CM | POA: Diagnosis not present

## 2017-07-22 DIAGNOSIS — J019 Acute sinusitis, unspecified: Secondary | ICD-10-CM | POA: Diagnosis not present

## 2017-07-22 DIAGNOSIS — I499 Cardiac arrhythmia, unspecified: Secondary | ICD-10-CM | POA: Diagnosis present

## 2017-07-22 DIAGNOSIS — J189 Pneumonia, unspecified organism: Secondary | ICD-10-CM | POA: Diagnosis present

## 2017-07-22 DIAGNOSIS — G4733 Obstructive sleep apnea (adult) (pediatric): Secondary | ICD-10-CM | POA: Diagnosis not present

## 2017-07-22 DIAGNOSIS — E039 Hypothyroidism, unspecified: Secondary | ICD-10-CM | POA: Diagnosis not present

## 2017-07-22 DIAGNOSIS — J181 Lobar pneumonia, unspecified organism: Principal | ICD-10-CM | POA: Diagnosis present

## 2017-07-22 DIAGNOSIS — R531 Weakness: Secondary | ICD-10-CM | POA: Diagnosis not present

## 2017-07-22 DIAGNOSIS — F329 Major depressive disorder, single episode, unspecified: Secondary | ICD-10-CM | POA: Diagnosis not present

## 2017-07-22 DIAGNOSIS — Z7902 Long term (current) use of antithrombotics/antiplatelets: Secondary | ICD-10-CM | POA: Diagnosis not present

## 2017-07-22 DIAGNOSIS — R402411 Glasgow coma scale score 13-15, in the field [EMT or ambulance]: Secondary | ICD-10-CM | POA: Diagnosis not present

## 2017-07-22 DIAGNOSIS — I10 Essential (primary) hypertension: Secondary | ICD-10-CM | POA: Diagnosis not present

## 2017-07-22 DIAGNOSIS — N3090 Cystitis, unspecified without hematuria: Secondary | ICD-10-CM | POA: Diagnosis present

## 2017-07-22 DIAGNOSIS — I639 Cerebral infarction, unspecified: Secondary | ICD-10-CM | POA: Diagnosis not present

## 2017-07-22 DIAGNOSIS — G20 Parkinson's disease: Secondary | ICD-10-CM | POA: Diagnosis not present

## 2017-07-22 DIAGNOSIS — S069X9A Unspecified intracranial injury with loss of consciousness of unspecified duration, initial encounter: Secondary | ICD-10-CM | POA: Diagnosis not present

## 2017-07-22 DIAGNOSIS — J018 Other acute sinusitis: Secondary | ICD-10-CM

## 2017-07-22 DIAGNOSIS — F0280 Dementia in other diseases classified elsewhere without behavioral disturbance: Secondary | ICD-10-CM | POA: Diagnosis not present

## 2017-07-22 DIAGNOSIS — R5383 Other fatigue: Secondary | ICD-10-CM | POA: Diagnosis not present

## 2017-07-22 DIAGNOSIS — E785 Hyperlipidemia, unspecified: Secondary | ICD-10-CM | POA: Diagnosis not present

## 2017-07-22 DIAGNOSIS — N3000 Acute cystitis without hematuria: Secondary | ICD-10-CM | POA: Diagnosis not present

## 2017-07-22 DIAGNOSIS — M6281 Muscle weakness (generalized): Secondary | ICD-10-CM | POA: Diagnosis not present

## 2017-07-22 DIAGNOSIS — F330 Major depressive disorder, recurrent, mild: Secondary | ICD-10-CM | POA: Diagnosis not present

## 2017-07-22 LAB — URINALYSIS, COMPLETE (UACMP) WITH MICROSCOPIC
Bilirubin Urine: NEGATIVE
Glucose, UA: NEGATIVE mg/dL
HGB URINE DIPSTICK: NEGATIVE
Ketones, ur: NEGATIVE mg/dL
Leukocytes, UA: NEGATIVE
Nitrite: NEGATIVE
Protein, ur: NEGATIVE mg/dL
SPECIFIC GRAVITY, URINE: 1.006 (ref 1.005–1.030)
pH: 7 (ref 5.0–8.0)

## 2017-07-22 LAB — COMPREHENSIVE METABOLIC PANEL
ALBUMIN: 4.2 g/dL (ref 3.5–5.0)
ALK PHOS: 80 U/L (ref 38–126)
ALT: 19 U/L (ref 17–63)
AST: 22 U/L (ref 15–41)
Anion gap: 7 (ref 5–15)
BUN: 20 mg/dL (ref 6–20)
CALCIUM: 9.7 mg/dL (ref 8.9–10.3)
CHLORIDE: 106 mmol/L (ref 101–111)
CO2: 23 mmol/L (ref 22–32)
CREATININE: 1.15 mg/dL (ref 0.61–1.24)
GFR calc non Af Amer: 57 mL/min — ABNORMAL LOW (ref >60)
GLUCOSE: 102 mg/dL — AB (ref 65–99)
Potassium: 3.8 mmol/L (ref 3.5–5.1)
SODIUM: 136 mmol/L (ref 135–145)
Total Bilirubin: 0.7 mg/dL (ref 0.3–1.2)
Total Protein: 7 g/dL (ref 6.5–8.1)

## 2017-07-22 LAB — CBC WITH DIFFERENTIAL/PLATELET
BASOS ABS: 0 10*3/uL (ref 0–0.1)
BASOS PCT: 0 %
Band Neutrophils: 3 %
Blasts: 0 %
Eosinophils Absolute: 0.7 10*3/uL (ref 0–0.7)
Eosinophils Relative: 5 %
HCT: 42.3 % (ref 40.0–52.0)
HEMOGLOBIN: 13.8 g/dL (ref 13.0–18.0)
Lymphocytes Relative: 8 %
Lymphs Abs: 1.1 10*3/uL (ref 1.0–3.6)
MCH: 30.1 pg (ref 26.0–34.0)
MCHC: 32.7 g/dL (ref 32.0–36.0)
MCV: 91.9 fL (ref 80.0–100.0)
METAMYELOCYTES PCT: 2 %
MONO ABS: 0.5 10*3/uL (ref 0.2–1.0)
MYELOCYTES: 0 %
Monocytes Relative: 4 %
NEUTROS PCT: 78 %
NRBC: 0 /100{WBCs}
Neutro Abs: 11.2 10*3/uL — ABNORMAL HIGH (ref 1.4–6.5)
Other: 0 %
PLATELETS: 232 10*3/uL (ref 150–440)
Promyelocytes Absolute: 0 %
RBC: 4.6 MIL/uL (ref 4.40–5.90)
RDW: 16 % — ABNORMAL HIGH (ref 11.5–14.5)
WBC: 13.5 10*3/uL — AB (ref 3.8–10.6)

## 2017-07-22 LAB — TROPONIN I: Troponin I: <0.03 ng/mL (ref <0.03)

## 2017-07-22 MED ORDER — LEVOFLOXACIN IN D5W 750 MG/150ML IV SOLN
750.0000 mg | Freq: Once | INTRAVENOUS | Status: AC
  Administered 2017-07-22: 750 mg via INTRAVENOUS
  Filled 2017-07-22: qty 150

## 2017-07-22 NOTE — ED Triage Notes (Signed)
Patient reports he has been sleeping a lot today.  Wife reports pulse has been fluctuating.

## 2017-07-22 NOTE — ED Notes (Signed)
Pt eating and drinking at this time. 

## 2017-07-22 NOTE — ED Provider Notes (Signed)
[facility] Emergency Department Provider Note       Time seen: ----------------------------------------- 8:52 PM on 07/22/2017 -----------------------------------------   I have reviewed the triage vital signs and the nursing notes.  HISTORY   Chief Complaint Weakness    HPI Fred Rieth. is a 84 y.o. male with a history of arthritis, prostate cancer, depression and GERD who presents to the ED for fatigue and irregular heartbeat.  Patient was also incontinent of urine today at some point but typically wears a diaper.  He states that urinating on himself is unusual.  He thinks he may have had a fever, he denies chest pain or difficulty breathing.  Past Medical History:  Diagnosis Date  . Arthritis    fingers, neck  . Cancer of prostate ([place])   . Cancer of skin   . Depression   . GERD (gastroesophageal reflux disease)    rare  . Hypertension    Echo s/p fall 11/15  . Stroke [facility])     Patient Active Problem List   Diagnosis Date Noted  . Weakness 07/07/2016  . Stroke (cerebrum) ([place]) 12/20/2015  . HTN (hypertension) 12/20/2015  . GERD (gastroesophageal reflux disease) 12/20/2015  . Depression 12/20/2015    Past Surgical History:  Procedure Laterality Date  . CATARACT EXTRACTION W/PHACO Right 11/27/2014   Procedure: CATARACT EXTRACTION PHACO AND INTRAOCULAR LENS PLACEMENT (IOC);  Surgeon: Leandrew Koyanagi, MD;  Location: [place];  Service: Ophthalmology;  Laterality: Right;  . EYE SURGERY Left 09/25/14   cataract @[facility]   . HERNIA REPAIR  1999  . KIDNEY STONE SURGERY    . MELANOMA EXCISION    . PROSTATE SURGERY  2007    Allergies Patient has no known allergies.  Social History Social History   Tobacco Use  . Smoking status: Never Smoker  . Smokeless tobacco: Never Used  Substance Use Topics  . Alcohol use: Yes    Alcohol/week: 3.0 oz    Types: 5 Glasses of wine per week  . Drug use: No    Review of  Systems Constitutional: Negative for fever. Cardiovascular: Negative for chest pain.  Positive for palpitations Respiratory: Negative for shortness of breath. Gastrointestinal: Negative for abdominal pain, vomiting and diarrhea. Genitourinary: Negative for dysuria. Musculoskeletal: Negative for back pain. Skin: Negative for rash. Neurological: Negative for headaches, positive for generalized weakness  All systems negative/normal/unremarkable except as stated in the HPI  ____________________________________________   PHYSICAL EXAM:  VITAL SIGNS: ED Triage Vitals  Enc Vitals Group     BP 07/22/17 2020 (!) 165/96     Pulse Rate 07/22/17 2021 (!) 56     Resp --      Temp 07/22/17 2021 98 F (36.7 C)     Temp Source 07/22/17 2021 Oral     SpO2 07/22/17 2020 97 %     Weight 07/22/17 2017 160 lb (72.6 kg)     Height 07/22/17 2017 5\' 7"  (1.702 m)     Head Circumference --      Peak Flow --      Pain Score --      Pain Loc --      Pain Edu? --      Excl. in [place]? --     Constitutional: Alert and oriented.  No distress Eyes: Conjunctivae are normal. Normal extraocular movements. ENT   Head: Normocephalic and atraumatic.   Nose: Rhinorrhea is noted   Mouth/Throat: Mucous membranes are moist.   Neck: No stridor. Cardiovascular:  Normal rate, regular rhythm. No murmurs, rubs, or gallops. Respiratory: Normal respiratory effort without tachypnea nor retractions. Breath sounds are clear and equal bilaterally. No wheezes/rales/rhonchi. Gastrointestinal: Soft and nontender. Normal bowel sounds Musculoskeletal: Nontender with normal range of motion in extremities. No lower extremity tenderness nor edema. Neurologic:  Normal speech and language. No gross focal neurologic deficits are appreciated.  Generalized weakness, nothing focal Skin:  Skin is warm, dry and intact. No rash noted. Psychiatric: Mood and affect are normal. Speech and behavior are normal.   ____________________________________________  EKG: Interpreted by me.   Sinus rhythm with occasional PVCs, left axis deviation, LVH branch block  ____________________________________________  ED COURSE:  As part of my medical decision making, I reviewed the following data within the [place] History obtained from family if available, nursing notes, old chart and ekg, as well as notes from prior ED visits. Patient presented for weakness, we will assess with labs and imaging as indicated at this time.   Procedures ____________________________________________   LABS (pertinent positives/negatives)  Labs Reviewed  CBC WITH DIFFERENTIAL/PLATELET - Abnormal; Notable for the following components:      Result Value   WBC 13.5 (*)    RDW 16.0 (*)    Neutro Abs 11.2 (*)    All other components within normal limits  COMPREHENSIVE METABOLIC PANEL - Abnormal; Notable for the following components:   Glucose, Bld 102 (*)    GFR calc non Af Amer 57 (*)    All other components within normal limits  URINALYSIS, COMPLETE (UACMP) WITH MICROSCOPIC - Abnormal; Notable for the following components:   Color, Urine STRAW (*)    APPearance CLEAR (*)    Bacteria, UA RARE (*)    Squamous Epithelial / LPF 0-5 (*)    All other components within normal limits  TROPONIN I  CBG MONITORING, ED    RADIOLOGY Images were viewed by me  CT head, chest x-ray IMPRESSION: Cardiomegaly with low volumes and right base atelectasis or Infiltrate. IMPRESSION: 1. No acute intracranial abnormality. Unchanged atrophy and chronic small vessel ischemia. 2. Paranasal sinus inflammation with fluid level in the left maxillary sinus, new from prior exam. ____________________________________________  DIFFERENTIAL DIAGNOSIS   CVA, dehydration, electrolyte abnormality, medication side effect, medication noncompliance, occult infection, pneumonia, UTI  FINAL ASSESSMENT AND PLAN  Weakness,  sinusitis, possible pneumonia   Plan: Patient had presented for weakness. Patient's labs revealed leukocytosis without other acute process. Patient's imaging revealed sinusitis and possible infiltrate on the right base.  We have ordered some IV Levaquin for him.  He does appear to week to stand and would likely benefit from hospitalization, antibiotics, fluids and physical therapy.   Earleen Newport, MD   Note: This note was generated in part or whole with voice recognition software. Voice recognition is usually quite accurate but there are transcription errors that can and very often do occur. I apologize for any typographical errors that were not detected and corrected.     Earleen Newport, MD 07/22/17 2228

## 2017-07-22 NOTE — ED Notes (Signed)
Pt transported to CT ?

## 2017-07-22 NOTE — ED Notes (Signed)
Pt up to void. Pt unable to void at this time.

## 2017-07-23 ENCOUNTER — Other Ambulatory Visit: Payer: Self-pay

## 2017-07-23 DIAGNOSIS — Z8673 Personal history of transient ischemic attack (TIA), and cerebral infarction without residual deficits: Secondary | ICD-10-CM | POA: Diagnosis not present

## 2017-07-23 DIAGNOSIS — I10 Essential (primary) hypertension: Secondary | ICD-10-CM | POA: Diagnosis present

## 2017-07-23 DIAGNOSIS — F0390 Unspecified dementia without behavioral disturbance: Secondary | ICD-10-CM | POA: Diagnosis present

## 2017-07-23 DIAGNOSIS — G20 Parkinson's disease: Secondary | ICD-10-CM | POA: Diagnosis present

## 2017-07-23 DIAGNOSIS — E039 Hypothyroidism, unspecified: Secondary | ICD-10-CM | POA: Diagnosis present

## 2017-07-23 DIAGNOSIS — E860 Dehydration: Secondary | ICD-10-CM | POA: Diagnosis present

## 2017-07-23 DIAGNOSIS — R402411 Glasgow coma scale score 13-15, in the field [EMT or ambulance]: Secondary | ICD-10-CM | POA: Diagnosis present

## 2017-07-23 DIAGNOSIS — J181 Lobar pneumonia, unspecified organism: Secondary | ICD-10-CM | POA: Diagnosis present

## 2017-07-23 DIAGNOSIS — F329 Major depressive disorder, single episode, unspecified: Secondary | ICD-10-CM | POA: Diagnosis present

## 2017-07-23 DIAGNOSIS — I499 Cardiac arrhythmia, unspecified: Secondary | ICD-10-CM | POA: Diagnosis present

## 2017-07-23 DIAGNOSIS — J019 Acute sinusitis, unspecified: Secondary | ICD-10-CM | POA: Diagnosis present

## 2017-07-23 DIAGNOSIS — Z8546 Personal history of malignant neoplasm of prostate: Secondary | ICD-10-CM | POA: Diagnosis not present

## 2017-07-23 DIAGNOSIS — N3090 Cystitis, unspecified without hematuria: Secondary | ICD-10-CM | POA: Diagnosis present

## 2017-07-23 DIAGNOSIS — R531 Weakness: Secondary | ICD-10-CM | POA: Diagnosis present

## 2017-07-23 DIAGNOSIS — Z79899 Other long term (current) drug therapy: Secondary | ICD-10-CM | POA: Diagnosis not present

## 2017-07-23 DIAGNOSIS — J189 Pneumonia, unspecified organism: Secondary | ICD-10-CM | POA: Diagnosis present

## 2017-07-23 DIAGNOSIS — M19049 Primary osteoarthritis, unspecified hand: Secondary | ICD-10-CM | POA: Diagnosis present

## 2017-07-23 DIAGNOSIS — Z7902 Long term (current) use of antithrombotics/antiplatelets: Secondary | ICD-10-CM | POA: Diagnosis not present

## 2017-07-23 DIAGNOSIS — M479 Spondylosis, unspecified: Secondary | ICD-10-CM | POA: Diagnosis present

## 2017-07-23 LAB — CBC
HCT: 43.4 % (ref 40.0–52.0)
HEMOGLOBIN: 14.5 g/dL (ref 13.0–18.0)
MCH: 30.8 pg (ref 26.0–34.0)
MCHC: 33.3 g/dL (ref 32.0–36.0)
MCV: 92.5 fL (ref 80.0–100.0)
Platelets: 223 10*3/uL (ref 150–440)
RBC: 4.7 MIL/uL (ref 4.40–5.90)
RDW: 16.7 % — AB (ref 11.5–14.5)
WBC: 11.2 10*3/uL — ABNORMAL HIGH (ref 3.8–10.6)

## 2017-07-23 LAB — BASIC METABOLIC PANEL
ANION GAP: 8 (ref 5–15)
BUN: 16 mg/dL (ref 6–20)
CALCIUM: 9.7 mg/dL (ref 8.9–10.3)
CO2: 27 mmol/L (ref 22–32)
Chloride: 107 mmol/L (ref 101–111)
Creatinine, Ser: 1.11 mg/dL (ref 0.61–1.24)
GFR calc Af Amer: >60 mL/min (ref >60)
GFR calc non Af Amer: 59 mL/min — ABNORMAL LOW (ref >60)
GLUCOSE: 102 mg/dL — AB (ref 65–99)
Potassium: 4 mmol/L (ref 3.5–5.1)
Sodium: 142 mmol/L (ref 135–145)

## 2017-07-23 LAB — GLUCOSE, CAPILLARY: Glucose-Capillary: 99 mg/dL (ref 65–99)

## 2017-07-23 MED ORDER — HEPARIN SODIUM (PORCINE) 5000 UNIT/ML IJ SOLN
5000.0000 [IU] | Freq: Three times a day (TID) | INTRAMUSCULAR | Status: DC
  Administered 2017-07-23 – 2017-07-25 (×7): 5000 [IU] via SUBCUTANEOUS
  Filled 2017-07-23 (×7): qty 1

## 2017-07-23 MED ORDER — VITAMIN D 1000 UNITS PO TABS
2000.0000 [IU] | ORAL_TABLET | Freq: Every day | ORAL | Status: DC
  Administered 2017-07-23 – 2017-07-25 (×3): 2000 [IU] via ORAL
  Filled 2017-07-23 (×3): qty 2

## 2017-07-23 MED ORDER — LEVOTHYROXINE SODIUM 50 MCG PO TABS
75.0000 ug | ORAL_TABLET | Freq: Every day | ORAL | Status: DC
  Administered 2017-07-23 – 2017-07-25 (×3): 75 ug via ORAL
  Filled 2017-07-23 (×3): qty 1

## 2017-07-23 MED ORDER — LEVOFLOXACIN 500 MG PO TABS: 500.0000 mg | ORAL_TABLET | ORAL | Status: DC

## 2017-07-23 MED ORDER — ONDANSETRON HCL 4 MG/2ML IJ SOLN: 4.0000 mg | Freq: Four times a day (QID) | INTRAMUSCULAR | Status: DC | PRN

## 2017-07-23 MED ORDER — HYDROCODONE-ACETAMINOPHEN 5-325 MG PO TABS: 1.0000 | ORAL_TABLET | ORAL | Status: DC | PRN

## 2017-07-23 MED ORDER — DULOXETINE HCL 30 MG PO CPEP
30.0000 mg | ORAL_CAPSULE | Freq: Every day | ORAL | Status: DC
  Administered 2017-07-23 – 2017-07-25 (×3): 30 mg via ORAL
  Filled 2017-07-23 (×3): qty 1

## 2017-07-23 MED ORDER — CLOPIDOGREL BISULFATE 75 MG PO TABS
75.0000 mg | ORAL_TABLET | Freq: Every day | ORAL | Status: DC
  Administered 2017-07-23 – 2017-07-25 (×3): 75 mg via ORAL
  Filled 2017-07-23 (×3): qty 1

## 2017-07-23 MED ORDER — TRAZODONE HCL 50 MG PO TABS
25.0000 mg | ORAL_TABLET | Freq: Every evening | ORAL | Status: DC | PRN
Start: 2017-07-23 — End: 2017-07-25

## 2017-07-23 MED ORDER — SODIUM CHLORIDE 0.9 % IV SOLN
Freq: Once | INTRAVENOUS | Status: AC
  Administered 2017-07-23: 02:00:00 via INTRAVENOUS

## 2017-07-23 MED ORDER — LEVOFLOXACIN IN D5W 500 MG/100ML IV SOLN
500.0000 mg | INTRAVENOUS | Status: DC
  Administered 2017-07-23: 13:00:00 500 mg via INTRAVENOUS
  Filled 2017-07-23 (×2): qty 100

## 2017-07-23 MED ORDER — ACETAMINOPHEN 650 MG RE SUPP: 650.0000 mg | Freq: Four times a day (QID) | RECTAL | Status: DC | PRN

## 2017-07-23 MED ORDER — DONEPEZIL HCL 5 MG PO TABS
5.0000 mg | ORAL_TABLET | Freq: Every day | ORAL | Status: DC
  Administered 2017-07-23 – 2017-07-24 (×2): 5 mg via ORAL
  Filled 2017-07-23 (×3): qty 1

## 2017-07-23 MED ORDER — LEVOFLOXACIN 500 MG PO TABS: 500.0000 mg | ORAL_TABLET | Freq: Every day | ORAL | Status: DC

## 2017-07-23 MED ORDER — CARBIDOPA-LEVODOPA 25-250 MG PO TABS
1.0000 | ORAL_TABLET | Freq: Three times a day (TID) | ORAL | Status: DC
  Administered 2017-07-23 – 2017-07-25 (×7): 1 via ORAL
  Filled 2017-07-23 (×9): qty 1

## 2017-07-23 MED ORDER — BISACODYL 5 MG PO TBEC: 5.0000 mg | DELAYED_RELEASE_TABLET | Freq: Every day | ORAL | Status: DC | PRN

## 2017-07-23 MED ORDER — ONDANSETRON HCL 4 MG PO TABS: 4.0000 mg | ORAL_TABLET | Freq: Four times a day (QID) | ORAL | Status: DC | PRN

## 2017-07-23 MED ORDER — ATORVASTATIN CALCIUM 20 MG PO TABS
10.0000 mg | ORAL_TABLET | Freq: Every day | ORAL | Status: DC
  Administered 2017-07-23 – 2017-07-24 (×2): 10 mg via ORAL
  Filled 2017-07-23 (×2): qty 1

## 2017-07-23 MED ORDER — DOCUSATE SODIUM 100 MG PO CAPS
100.0000 mg | ORAL_CAPSULE | Freq: Two times a day (BID) | ORAL | Status: DC
  Administered 2017-07-23 – 2017-07-25 (×5): 100 mg via ORAL
  Filled 2017-07-23 (×5): qty 1

## 2017-07-23 MED ORDER — ACETAMINOPHEN 325 MG PO TABS: 650.0000 mg | ORAL_TABLET | Freq: Four times a day (QID) | ORAL | Status: DC | PRN

## 2017-07-23 NOTE — Plan of Care (Signed)
  Progressing Education: Knowledge of General Education information will improve 07/23/2017 0452 - Progressing by Sonda Primes, RN Health Behavior/Discharge Planning: Ability to manage health-related needs will improve 07/23/2017 0452 - Progressing by Sonda Primes, RN Clinical Measurements: Ability to maintain clinical measurements within normal limits will improve 07/23/2017 0452 - Progressing by Sonda Primes, RN Will remain free from infection 07/23/2017 0452 - Progressing by Sonda Primes, RN Diagnostic test results will improve 07/23/2017 0452 - Progressing by Sonda Primes, RN Respiratory complications will improve 07/23/2017 0452 - Progressing by Sonda Primes, RN Cardiovascular complication will be avoided 07/23/2017 0452 - Progressing by Sonda Primes, RN Activity: Risk for activity intolerance will decrease 07/23/2017 0452 - Progressing by Sonda Primes, RN Nutrition: Adequate nutrition will be maintained 07/23/2017 0452 - Progressing by Sonda Primes, RN Coping: Level of anxiety will decrease 07/23/2017 0452 - Progressing by Sonda Primes, RN Pain Managment: General experience of comfort will improve 07/23/2017 0452 - Progressing by Sonda Primes, RN Safety: Ability to remain free from injury will improve 07/23/2017 0452 - Progressing by Sonda Primes, RN Respiratory: Ability to maintain adequate ventilation will improve 07/23/2017 0452 - Progressing by Sonda Primes, RN Ability to maintain a clear airway will improve 07/23/2017 0452 - Progressing by Sonda Primes, RN

## 2017-07-23 NOTE — Clinical Social Work Note (Signed)
Clinical Social Work Assessment  Patient Details  Name: Fred COUFAL Sr. MRN: [id number] Date of Birth: 01-18-1931  Date of referral:  07/23/17               Reason for consult:  Facility Placement                Permission sought to share information with:  Chartered certified accountant granted to share information::  Yes, Verbal Permission Granted  Name::        Agency::  [facility]  Relationship::  Respite care facility (SNF)  Contact Information:     Housing/Transportation Living arrangements for the past 2 months:  [place] of Information:  Medical Team, Spouse Patient Interpreter Needed:  None Criminal Activity/Legal Involvement Pertinent to Current Situation/Hospitalization:  No - Comment as needed Significant Relationships:  Adult Children, Warehouse manager, Spouse Lives with:  Spouse Do you feel safe going back to the place where you live?  Yes Need for family participation in patient care:  No (Coment)  Care giving concerns:  RNCM received consult for respite placement from independent living   Social Worker assessment / plan:  CSW contacted the patient's spouse, Fred Jones, to discuss discharge planning. Fred Jones stated that she and her husband are residents at the [facility] [place], and she has arranged respite care at [facility] through [facility].   The CSW has sent a message to [facility] admission coordinator to confirm and is waiting for a returned call. The plan is for the patient to discharge to respite care when stable unless [facility] declines. CSW will continue to follow to facilitate discharge.  Employment status:  Retired Nurse, adult PT Recommendations:  Not assessed at this time Information / Referral to community resources:     Patient/Family's Response to care:  The patient's wife thanked the CSW for assistance.  Patient/Family's Understanding of and Emotional Response  to Diagnosis, Current Treatment, and Prognosis:  The patient's spouse is in agreement with discharge to respite care at a SNF.  Emotional Assessment Appearance:  Appears stated age Attitude/Demeanor/Rapport:  Lethargic(Patient was sleeping) Affect (typically observed):  Withdrawn, Other(Patient was sleeping) Orientation:  (Patient was sleeping/Baseline oriented x4) Alcohol / Substance use:  Never Used Psych involvement (Current and /or in the community):  No (Comment)  Discharge Needs  Concerns to be addressed:  Care Coordination, Discharge Planning Concerns Readmission within the last 30 days:  No Current discharge risk:  None Barriers to Discharge:  Continued Medical Work up   [employer], LCSW 07/23/2017, 4:31 PM

## 2017-07-23 NOTE — NC FL2 (Signed)
[place] LEVEL OF CARE SCREENING TOOL     IDENTIFICATION  Patient Name: Fred TOUT Sr. Birthdate: [birth year] Sex: male Admission Date (Current Location): 07/22/2017  [place] and [place] Number:  Engineering geologist and Address:  [facility], [address], [place],  [address]      Provider Number: [id number]  Attending Physician Name and Address:  Dustin Flock, MD  Relative Name and Phone Number:  Craig Ionescu (Spouse) [phone]    Current Level of Care: Hospital Recommended Level of Care: [place] Prior Approval Number:    Date Approved/Denied:   PASRR Number: [id number]  Discharge Plan: SNF    Current Diagnoses: Patient Active Problem List   Diagnosis Date Noted  . CAP (community acquired pneumonia) 07/23/2017  . Weakness 07/07/2016  . Stroke (cerebrum) ([place]) 12/20/2015  . HTN (hypertension) 12/20/2015  . GERD (gastroesophageal reflux disease) 12/20/2015  . Depression 12/20/2015    Orientation RESPIRATION BLADDER Height & Weight     Self, Time  Normal Continent Weight: 169 lb 3.2 oz (76.7 kg) Height:  5\' 7"  (170.2 cm)  BEHAVIORAL SYMPTOMS/MOOD NEUROLOGICAL BOWEL NUTRITION STATUS      Continent Diet(Heart healthy)  AMBULATORY STATUS COMMUNICATION OF NEEDS Skin   Supervision Verbally Normal                       Personal Care Assistance Level of Assistance  Bathing, Feeding, Dressing Bathing Assistance: Limited assistance Feeding assistance: Independent Dressing Assistance: Limited assistance     Functional Limitations Info             SPECIAL CARE FACTORS FREQUENCY                       Contractures Contractures Info: Not present    Additional Factors Info  Code Status, Allergies, Psychotropic Code Status Info: Full Allergies Info: No Known Allergies Psychotropic Info: Cymbalta, Desyrel         Current Medications (07/23/2017):  This is the current  hospital active medication list Current Facility-Administered Medications  Medication Dose Route Frequency Provider Last Rate Last Dose  . acetaminophen (TYLENOL) tablet 650 mg  650 mg Oral Q6H PRN Amelia Jo, MD       Or  . acetaminophen (TYLENOL) suppository 650 mg  650 mg Rectal Q6H PRN Amelia Jo, MD      . atorvastatin (LIPITOR) tablet 10 mg  10 mg Oral Daily Amelia Jo, MD      . bisacodyl (DULCOLAX) EC tablet 5 mg  5 mg Oral Daily PRN Amelia Jo, MD      . carbidopa-levodopa (SINEMET IR) 25-250 MG per tablet immediate release 1 tablet  1 tablet Oral TID Amelia Jo, MD   1 tablet at 07/23/17 0850  . cholecalciferol (VITAMIN D) tablet 2,000 Units  2,000 Units Oral Daily Amelia Jo, MD   2,000 Units at 07/23/17 0850  . clopidogrel (PLAVIX) tablet 75 mg  75 mg Oral Daily Amelia Jo, MD   75 mg at 07/23/17 0850  . docusate sodium (COLACE) capsule 100 mg  100 mg Oral BID Amelia Jo, MD   100 mg at 07/23/17 0851  . donepezil (ARICEPT) tablet 5 mg  5 mg Oral QHS Amelia Jo, MD      . DULoxetine (CYMBALTA) DR capsule 30 mg  30 mg Oral Daily Amelia Jo, MD   30 mg at 07/23/17 0850  . heparin injection 5,000 Units  5,000  Units Subcutaneous Q8H Amelia Jo, MD   5,000 Units at 07/23/17 1550  . HYDROcodone-acetaminophen (NORCO/VICODIN) 5-325 MG per tablet 1-2 tablet  1-2 tablet Oral Q4H PRN Amelia Jo, MD      . levofloxacin (LEVAQUIN) IVPB 500 mg  500 mg Intravenous Q48H Dustin Flock, MD   Stopped at 07/23/17 1400  . levothyroxine (SYNTHROID, LEVOTHROID) tablet 75 mcg  75 mcg Oral QAC breakfast Amelia Jo, MD   75 mcg at 07/23/17 0850  . ondansetron (ZOFRAN) tablet 4 mg  4 mg Oral Q6H PRN Amelia Jo, MD       Or  . ondansetron [facility]) injection 4 mg  4 mg Intravenous Q6H PRN Amelia Jo, MD      . traZODone (DESYREL) tablet 25 mg  25 mg Oral QHS PRN Amelia Jo, MD         Discharge Medications: Please see discharge summary for a list of discharge  medications.  Relevant Imaging Results:  Relevant Lab Results:   Additional Information SS# [id number]  Zettie Pho, LCSW

## 2017-07-23 NOTE — H&P (Signed)
[place] [facility] [place] NAME: Fred Jones    MR#:  [id number]  DATE OF BIRTH:  1930/12/04  DATE OF ADMISSION:  07/22/2017  PRIMARY CARE PHYSICIAN: Rusty Aus, MD   REQUESTING/REFERRING PHYSICIAN:   CHIEF COMPLAINT:   Chief Complaint  Patient presents with  . Weakness    HISTORY OF PRESENT ILLNESS: Fred Jones  is a 84 y.o. male with a known history of tension prostate cancer and remote stroke. Patient was brought to emergency room for generalized weakness going on for the past 2-3 days.  No fever/chills, no chest pain, no bleeding no nausea/vomiting, abdominal pain or diarrhea.  He admits to occasional cough the past week. The patient is a poor historian; most of the info was taken from reviewing the medical records and from discussion with the family.  Upon evaluation in the emergency room, he was noted to have elevated WBC at 13.5.  Brain CT showed acute sinusitis and chest x-ray showed right lower lobe pneumonia.  UA is positive for UTI.  PAST MEDICAL HISTORY:   Past Medical History:  Diagnosis Date  . Arthritis    fingers, neck  . Cancer of prostate ([place])   . Cancer of skin   . Depression   . GERD (gastroesophageal reflux disease)    rare  . Hypertension    Echo s/p fall 11/15  . Stroke [facility])     PAST SURGICAL HISTORY:  Past Surgical History:  Procedure Laterality Date  . CATARACT EXTRACTION W/PHACO Right 11/27/2014   Procedure: CATARACT EXTRACTION PHACO AND INTRAOCULAR LENS PLACEMENT (IOC);  Surgeon: Leandrew Koyanagi, MD;  Location: [facility];  Service: Ophthalmology;  Laterality: Right;  . EYE SURGERY Left 09/25/14   cataract @[facility]   . HERNIA REPAIR  1999  . KIDNEY STONE SURGERY    . MELANOMA EXCISION    . PROSTATE SURGERY  2007    SOCIAL HISTORY:  Social History   Tobacco Use  . Smoking status: Never Smoker  . Smokeless tobacco: Never Used  Substance Use Topics  . Alcohol use: Yes     Alcohol/week: 2.4 oz    Types: 4 Glasses of wine per week    FAMILY HISTORY:  Family History  Problem Relation Age of Onset  . Cancer Unknown   . Stroke Unknown   . Heart attack Unknown   . Stroke Mother   . Stroke Father     DRUG ALLERGIES: No Known Allergies  REVIEW OF SYSTEMS:   CONSTITUTIONAL: No fever, fatigue or weakness.  EYES: No blurred or double vision.  EARS, NOSE, AND THROAT: No tinnitus or ear pain.  RESPIRATORY: No cough, shortness of breath, wheezing or hemoptysis.  CARDIOVASCULAR: No chest pain, orthopnea, edema.  GASTROINTESTINAL: No nausea, vomiting, diarrhea or abdominal pain.  GENITOURINARY: No dysuria, hematuria.  ENDOCRINE: No polyuria, nocturia,  HEMATOLOGY: No anemia, easy bruising or bleeding SKIN: No rash or lesion. MUSCULOSKELETAL: No joint pain or arthritis.   NEUROLOGIC: No tingling, numbness, weakness.  PSYCHIATRY: No anxiety or depression.   MEDICATIONS AT HOME:  Prior to Admission medications   Medication Sig Start Date End Date Taking? Authorizing Provider  atorvastatin (LIPITOR) 10 MG tablet Take 10 mg by mouth daily.    Yes [provider]  carbidopa-levodopa (SINEMET IR) 25-250 MG tablet Take 1 tablet by mouth 3 (three) times daily. 06/14/16  Yes [provider]  Cholecalciferol (VITAMIN D3) 2000 units capsule Take 1 capsule by mouth daily.  Yes [provider]  clopidogrel (PLAVIX) 75 MG tablet Take 1 tablet (75 mg total) by mouth daily. 12/22/15  Yes Sudini, Alveta Heimlich, MD  donepezil (ARICEPT) 5 MG tablet Take 5 mg by mouth at bedtime. 12/16/15  Yes [provider]  DULoxetine (CYMBALTA) 30 MG capsule Take 30 mg by mouth daily. 07/05/16  Yes [provider]  levothyroxine (SYNTHROID, LEVOTHROID) 75 MCG tablet Take 75 mcg by mouth daily. 03/02/17  Yes [provider]      PHYSICAL EXAMINATION:   VITAL SIGNS: Blood pressure (!) 159/69, pulse (!) 55, temperature 98.4 F (36.9 C),  temperature source Oral, resp. rate 18, height 5\' 7"  (1.702 m), weight 76.7 kg (169 lb 3.2 oz), SpO2 100 %.  GENERAL:  84 y.o.-year-old patient lying in the bed with no acute distress.  EYES:  No scleral icterus.   HEENT: Head atraumatic, normocephalic. Oropharynx and nasopharynx clear.  NECK:  Supple, no jugular venous distention. No thyroid enlargement, no tenderness.  LUNGS: Reduced breath sounds bilaterally; no wheezing, rales,rhonchi or crepitation. No use of accessory muscles of respiration.  CARDIOVASCULAR: S1, S2 normal. No S3/S4.  ABDOMEN: Soft, nontender, nondistended. Bowel sounds present. No organomegaly or mass.  EXTREMITIES: No pedal edema, cyanosis, or clubbing.  NEUROLOGIC: No focal weakness. Gait not checked.  PSYCHIATRIC: The patient is alert and oriented x 2.  SKIN: No obvious rash, lesion, or ulcer.   LABORATORY PANEL:   CBC Recent Labs  Lab 07/22/17 2037  WBC 13.5*  HGB 13.8  HCT 42.3  PLT 232  MCV 91.9  MCH 30.1  MCHC 32.7  RDW 16.0*  LYMPHSABS 1.1  MONOABS 0.5  EOSABS 0.7  BASOSABS 0.0   ------------------------------------------------------------------------------------------------------------------  Chemistries  Recent Labs  Lab 07/22/17 2037  NA 136  K 3.8  CL 106  CO2 23  GLUCOSE 102*  BUN 20  CREATININE 1.15  CALCIUM 9.7  AST 22  ALT 19  ALKPHOS 80  BILITOT 0.7   ------------------------------------------------------------------------------------------------------------------ estimated creatinine clearance is 44.7 mL/min (by C-G formula based on SCr of 1.15 mg/dL). ------------------------------------------------------------------------------------------------------------------ No results for input(s): TSH, T4TOTAL, T3FREE, THYROIDAB in the last 72 hours.  Invalid input(s): FREET3   Coagulation profile No results for input(s): INR, PROTIME in the last 168  hours. ------------------------------------------------------------------------------------------------------------------- No results for input(s): DDIMER in the last 72 hours. -------------------------------------------------------------------------------------------------------------------  Cardiac Enzymes Recent Labs  Lab 07/22/17 2037  TROPONINI <0.03   ------------------------------------------------------------------------------------------------------------------ Invalid input(s): POCBNP  ---------------------------------------------------------------------------------------------------------------  Urinalysis    Component Value Date/Time   COLORURINE STRAW (A) 07/22/2017 2037   APPEARANCEUR CLEAR (A) 07/22/2017 2037   APPEARANCEUR Clear 06/12/2014 2255   LABSPEC 1.006 07/22/2017 2037   LABSPEC 1.005 06/12/2014 2255   PHURINE 7.0 07/22/2017 2037   GLUCOSEU NEGATIVE 07/22/2017 2037   GLUCOSEU Negative 06/12/2014 2255   HGBUR NEGATIVE 07/22/2017 2037   BILIRUBINUR NEGATIVE 07/22/2017 2037   BILIRUBINUR Negative 06/12/2014 2255   KETONESUR NEGATIVE 07/22/2017 2037   PROTEINUR NEGATIVE 07/22/2017 2037   NITRITE NEGATIVE 07/22/2017 2037   LEUKOCYTESUR NEGATIVE 07/22/2017 2037   LEUKOCYTESUR Negative 06/12/2014 2255     RADIOLOGY: Dg Chest 1 View  Result Date: 07/22/2017 CLINICAL DATA:  Lethargy EXAM: CHEST 1 VIEW COMPARISON:  04/09/2017 FINDINGS: 2048 hours. Low lung volumes. The cardio pericardial silhouette is enlarged. Atelectasis or infiltrate noted at the right base. The visualized bony structures of the thorax are intact. Telemetry leads overlie the chest. IMPRESSION: Cardiomegaly with low volumes and right base atelectasis or infiltrate. Electronically Signed  By: Misty Stanley M.D.   On: 07/22/2017 21:20   Ct Head Wo Contrast  Result Date: 07/22/2017 CLINICAL DATA:  Altered level of consciousness. Weakness. Fall striking head. EXAM: CT HEAD WITHOUT CONTRAST  TECHNIQUE: Contiguous axial images were obtained from the base of the skull through the vertex without intravenous contrast. COMPARISON:  Head CT 05/18/2017 FINDINGS: Brain: No intracranial hemorrhage, mass effect, or midline shift. Unchanged atrophy and chronic small vessel ischemia. No hydrocephalus, the basilar cisterns are patent. No evidence of territorial infarct or acute ischemia. No extra-axial or intracranial fluid collection. Vascular: Skullbase atherosclerosis without hyperdense vessel. Skull: No fracture or focal lesion. Sinuses/Orbits: Paranasal sinus mucosal thickening involving bilateral ethmoid air cells, both frontal sinuses and left maxillary sinus. Small fluid level in the left maxillary sinus. Bilateral cataract resection. Other:  None. IMPRESSION: 1. No acute intracranial abnormality. Unchanged atrophy and chronic small vessel ischemia. 2. Paranasal sinus inflammation with fluid level in the left maxillary sinus, new from prior exam. Electronically Signed   By: Jeb Levering M.D.   On: 07/22/2017 21:13    EKG: Orders placed or performed during the hospital encounter of 07/22/17  . EKG 12-Lead  . EKG 12-Lead    IMPRESSION AND PLAN:  1.  Right lower lobe community-acquired pneumonia.  We will start antibiotics per protocol.  Continue to monitor clinically closely. 2.  Acute sinusitis, started on Levaquin. 3.  UTI, noted on Levaquin, awaiting urine culture results. 4.  Dementia stable continue home meds.  All the records are reviewed and case discussed with ED provider. Management plans discussed with the patient, family and they are in agreement.  CODE STATUS:    Code Status Orders  (From admission, onward)        Start     Ordered   07/23/17 0154  Full code  Continuous     07/23/17 0153    Code Status History    Date Active Date Inactive Code Status Order ID Comments User Context   07/07/2016 05:34 07/08/2016 18:32 Full Code [id number]  Harrie Foreman, MD ED    12/20/2015 23:30 12/22/2015 20:25 Full Code [id number]  Lance Coon, MD Inpatient    Advance Directive Documentation     Most Recent Value  Type of Advance Directive  Healthcare Power of Attorney  Pre-existing out of facility DNR order (yellow form or pink MOST form)  No data  "MOST" Form in Place?  No data       TOTAL TIME TAKING CARE OF THIS PATIENT: 35 minutes.    Amelia Jo M.D on 07/23/2017 at 4:30 AM  Between 7am to 6pm - Pager - [phone]  After 6pm go to [email or web address] - password [email or web address] [facility]  Office  [phone]  CC: Primary care physician; Rusty Aus, MD

## 2017-07-23 NOTE — ED Notes (Signed)
Pt wet in bed at this time. Pt changed and cleaned before transport to floor.

## 2017-07-23 NOTE — Care Management Note (Signed)
Case Management Note  Patient Details  Name: TAREQ DWAN Sr. MRN: [id number] Date of Birth: 1931/02/23  Subjective/Objective:   Per CSW, Mr Isadore has a respite bed at [facility] following hospital discharge.                  Action/Plan:   Expected Discharge Date:                  Expected Discharge Plan:     In-House Referral:     Discharge planning Services     Post Acute Care Choice:    Choice offered to:     DME Arranged:    DME Agency:     HH Arranged:    HH Agency:     Status of Service:     If discussed at [employer] of Stay Meetings, dates discussed:    Additional Comments:  Kahley Leib A, RN 07/23/2017, 10:05 AM

## 2017-07-23 NOTE — Progress Notes (Signed)
[place] at [facility] - Inpatient                                                                                                                                                                                  Patient Demographics   Fred Jones, is a 84 y.o. male, DOB - [birth year], JQB:[id number]  Admit date - 07/22/2017   Admitting Physician Amelia Jo, MD  Outpatient Primary MD for the patient is Rusty Aus, MD   LOS - 0  Subjective: Patient feeling better No cough   Review of Systems:   CONSTITUTIONAL: No documented fever. No fatigue, weakness. No weight gain, no weight loss.  EYES: No blurry or double vision.  ENT: No tinnitus. No postnasal drip. No redness of the oropharynx.  RESPIRATORY: No cough, no wheeze, no hemoptysis. No dyspnea.  CARDIOVASCULAR: No chest pain. No orthopnea. No palpitations. No syncope.  GASTROINTESTINAL: No nausea, no vomiting or diarrhea. No abdominal pain. No melena or hematochezia.  GENITOURINARY: No dysuria or hematuria.  ENDOCRINE: No polyuria or nocturia. No heat or cold intolerance.  HEMATOLOGY: No anemia. No bruising. No bleeding.  INTEGUMENTARY: No rashes. No lesions.  MUSCULOSKELETAL: No arthritis. No swelling. No gout.  NEUROLOGIC: No numbness, tingling, or ataxia. No seizure-type activity.  PSYCHIATRIC: No anxiety. No insomnia. No ADD.    Vitals:   Vitals:   07/23/17 0030 07/23/17 0100 07/23/17 0158 07/23/17 1313  BP: 139/82 (!) 141/69 (!) 159/69 (!) 151/76  Pulse: (!) 56 (!) 55 (!) 55 (!) 54  Resp: (!) 22 18 18    Temp:   98.4 F (36.9 C)   TempSrc:   Oral   SpO2: 95% 96% 100% 100%  Weight:   169 lb 3.2 oz (76.7 kg)   Height:   5\' 7"  (1.702 m)     Wt Readings from Last 3 Encounters:  07/23/17 169 lb 3.2 oz (76.7 kg)  04/09/17 160 lb (72.6 kg)  07/08/16 163 lb 12.8 oz (74.3 kg)     Intake/Output Summary (Last 24 hours) at 07/23/2017 1419 Last data filed at 07/23/2017 1305 Gross per 24 hour  Intake  1167.16 ml  Output 200 ml  Net 967.16 ml    Physical Exam:   GENERAL: Pleasant-appearing in no apparent distress.  HEAD, EYES, EARS, NOSE AND THROAT: Atraumatic, normocephalic. Extraocular muscles are intact. Pupils equal and reactive to light. Sclerae anicteric. No conjunctival injection. No oro-pharyngeal erythema.  NECK: Supple. There is no jugular venous distention. No bruits, no lymphadenopathy, no thyromegaly.  HEART: Regular rate and rhythm,. No murmurs, no rubs, no clicks.  LUNGS: Clear to auscultation bilaterally. No rales or rhonchi. No wheezes.  ABDOMEN: Soft,  flat, nontender, nondistended. Has good bowel sounds. No hepatosplenomegaly appreciated.  EXTREMITIES: No evidence of any cyanosis, clubbing, or peripheral edema.  +2 pedal and radial pulses bilaterally.  NEUROLOGIC: The patient is alert, awake, and oriented x3 with no focal motor or sensory deficits appreciated bilaterally.  SKIN: Moist and warm with no rashes appreciated.  Psych: Not anxious, depressed LN: No inguinal LN enlargement    Antibiotics   Anti-infectives (From admission, onward)   Start     Dose/Rate Route Frequency Ordered Stop   07/24/17 2200  levofloxacin (LEVAQUIN) tablet 500 mg  Status:  Discontinued     500 mg Oral Every 48 hours 07/23/17 0250 07/23/17 1158   07/23/17 1200  levofloxacin (LEVAQUIN) IVPB 500 mg     500 mg 100 mL/hr over 60 Minutes Intravenous Every 48 hours 07/23/17 1158     07/23/17 1000  levofloxacin (LEVAQUIN) tablet 500 mg  Status:  Discontinued     500 mg Oral Daily 07/23/17 0021 07/23/17 0250   07/22/17 2145  levofloxacin (LEVAQUIN) IVPB 750 mg     750 mg 100 mL/hr over 90 Minutes Intravenous  Once 07/22/17 2136 07/22/17 2342      Medications   Scheduled Meds: . atorvastatin  10 mg Oral Daily  . carbidopa-levodopa  1 tablet Oral TID  . cholecalciferol  2,000 Units Oral Daily  . clopidogrel  75 mg Oral Daily  . docusate sodium  100 mg Oral BID  . donepezil  5 mg Oral  QHS  . DULoxetine  30 mg Oral Daily  . heparin  5,000 Units Subcutaneous Q8H  . levothyroxine  75 mcg Oral QAC breakfast   Continuous Infusions: . levofloxacin (LEVAQUIN) IV 500 mg (07/23/17 1300)   PRN Meds:.acetaminophen **OR** acetaminophen, bisacodyl, HYDROcodone-acetaminophen, ondansetron **OR** ondansetron (ZOFRAN) IV, traZODone   Data Review:   Micro Results No results found for this or any previous visit (from the past 240 hour(s)).  Radiology Reports Dg Chest 1 View  Result Date: 07/22/2017 CLINICAL DATA:  Lethargy EXAM: CHEST 1 VIEW COMPARISON:  04/09/2017 FINDINGS: 2048 hours. Low lung volumes. The cardio pericardial silhouette is enlarged. Atelectasis or infiltrate noted at the right base. The visualized bony structures of the thorax are intact. Telemetry leads overlie the chest. IMPRESSION: Cardiomegaly with low volumes and right base atelectasis or infiltrate. Electronically Signed   By: Misty Stanley M.D.   On: 07/22/2017 21:20   Ct Head Wo Contrast  Result Date: 07/22/2017 CLINICAL DATA:  Altered level of consciousness. Weakness. Fall striking head. EXAM: CT HEAD WITHOUT CONTRAST TECHNIQUE: Contiguous axial images were obtained from the base of the skull through the vertex without intravenous contrast. COMPARISON:  Head CT 05/18/2017 FINDINGS: Brain: No intracranial hemorrhage, mass effect, or midline shift. Unchanged atrophy and chronic small vessel ischemia. No hydrocephalus, the basilar cisterns are patent. No evidence of territorial infarct or acute ischemia. No extra-axial or intracranial fluid collection. Vascular: Skullbase atherosclerosis without hyperdense vessel. Skull: No fracture or focal lesion. Sinuses/Orbits: Paranasal sinus mucosal thickening involving bilateral ethmoid air cells, both frontal sinuses and left maxillary sinus. Small fluid level in the left maxillary sinus. Bilateral cataract resection. Other:  None. IMPRESSION: 1. No acute intracranial  abnormality. Unchanged atrophy and chronic small vessel ischemia. 2. Paranasal sinus inflammation with fluid level in the left maxillary sinus, new from prior exam. Electronically Signed   By: Jeb Levering M.D.   On: 07/22/2017 21:13     CBC Recent Labs  Lab 07/22/17 2037 07/23/17 0420  WBC 13.5* 11.2*  HGB 13.8 14.5  HCT 42.3 43.4  PLT 232 223  MCV 91.9 92.5  MCH 30.1 30.8  MCHC 32.7 33.3  RDW 16.0* 16.7*  LYMPHSABS 1.1  --   MONOABS 0.5  --   EOSABS 0.7  --   BASOSABS 0.0  --     Chemistries  Recent Labs  Lab 07/22/17 2037 07/23/17 0420  NA 136 142  K 3.8 4.0  CL 106 107  CO2 23 27  GLUCOSE 102* 102*  BUN 20 16  CREATININE 1.15 1.11  CALCIUM 9.7 9.7  AST 22  --   ALT 19  --   ALKPHOS 80  --   BILITOT 0.7  --    ------------------------------------------------------------------------------------------------------------------ estimated creatinine clearance is 46.3 mL/min (by C-G formula based on SCr of 1.11 mg/dL). ------------------------------------------------------------------------------------------------------------------ No results for input(s): HGBA1C in the last 72 hours. ------------------------------------------------------------------------------------------------------------------ No results for input(s): CHOL, HDL, LDLCALC, TRIG, CHOLHDL, LDLDIRECT in the last 72 hours. ------------------------------------------------------------------------------------------------------------------ No results for input(s): TSH, T4TOTAL, T3FREE, THYROIDAB in the last 72 hours.  Invalid input(s): FREET3 ------------------------------------------------------------------------------------------------------------------ No results for input(s): VITAMINB12, FOLATE, FERRITIN, TIBC, IRON, RETICCTPCT in the last 72 hours.  Coagulation profile No results for input(s): INR, PROTIME in the last 168 hours.  No results for input(s): DDIMER in the last 72 hours.  Cardiac  Enzymes Recent Labs  Lab 07/22/17 2037  TROPONINI <0.03   ------------------------------------------------------------------------------------------------------------------ Invalid input(s): POCBNP    Assessment & Plan   IMPRESSION AND PLAN:  1.  Right lower lobe community-acquired pneumonia.    Continue IV Levaquin 2.  Acute sinusitis,  continue IV Levaquin. 3.    Possible UTI continue Levaquin 4.  Dementia stable continue home meds. 5. Essential hypertension currently not on any medications monitor blood pressure 6.  Parkinson's disease continue carbidopa levodopa 7.  Previous history of stroke continue Plavix 8.  Hypothyroidism continue Synthroid       Code Status Orders  (From admission, onward)        Start     Ordered   07/23/17 0154  Full code  Continuous     07/23/17 0153    Code Status History    Date Active Date Inactive Code Status Order ID Comments User Context   07/07/2016 05:34 07/08/2016 18:32 Full Code [id number]  Harrie Foreman, MD ED   12/20/2015 23:30 12/22/2015 20:25 Full Code [id number]  Lance Coon, MD Inpatient    Advance Directive Documentation     Most Recent Value  Type of Advance Directive  Healthcare Power of Attorney  Pre-existing out of facility DNR order (yellow form or pink MOST form)  No data  "MOST" Form in Place?  No data           Consults none  DVT Prophylaxis  Lovenox  Lab Results  Component Value Date   PLT 223 07/23/2017     Time Spent in minutes   35 minutes greater than 50% of time spent in care coordination and counseling patient regarding the condition and plan of care.   Dustin Flock M.D on 07/23/2017 at 2:19 PM  Between 7am to 6pm - Pager - [phone]  After 6pm go to [email or web address] - password [email or web address] [facility]   Office  [phone]

## 2017-07-23 NOTE — Plan of Care (Signed)
  Clinical Measurements: Diagnostic test results will improve 07/23/2017 1517 - Progressing by Oris Drone, RN  WBC 11.2 this am Safety: Ability to remain free from injury will improve 07/23/2017 1517 - Progressing by Oris Drone, RN  Maintained this shift Nutrition: Adequate nutrition will be maintained 07/23/2017 1517 - Progressing by Oris Drone, RN  Pt's family helping with menu orders this shift

## 2017-07-24 ENCOUNTER — Encounter
Admission: RE | Admit: 2017-07-24 | Discharge: 2017-07-24 | Disposition: A | Discharge status: Home / Self Care | Payer: PPO | Source: Ambulatory Visit | Attending: Internal Medicine | Admitting: Internal Medicine

## 2017-07-24 LAB — GLUCOSE, CAPILLARY: Glucose-Capillary: 108 mg/dL — ABNORMAL HIGH (ref 65–99)

## 2017-07-24 NOTE — Progress Notes (Signed)
[place] at [facility]                                                                                                                                                                                  Patient Demographics   Fred Jones, is a 84 y.o. male, DOB - 11/25/1930, XBD:[id number]  Admit date - 07/22/2017   Admitting Physician Amelia Jo, MD  Outpatient Primary MD for the patient is Rusty Aus, MD   LOS - 1  Subjective: Patient's symptoms improved. Still very weak  Review of Systems:   CONSTITUTIONAL: No documented fever. No fatigue, positive weakness. No weight gain, no weight loss.  EYES: No blurry or double vision.  ENT: No tinnitus. No postnasal drip. No redness of the oropharynx.  RESPIRATORY: No cough, no wheeze, no hemoptysis. No dyspnea.  CARDIOVASCULAR: No chest pain. No orthopnea. No palpitations. No syncope.  GASTROINTESTINAL: No nausea, no vomiting or diarrhea. No abdominal pain. No melena or hematochezia.  GENITOURINARY: No dysuria or hematuria.  ENDOCRINE: No polyuria or nocturia. No heat or cold intolerance.  HEMATOLOGY: No anemia. No bruising. No bleeding.  INTEGUMENTARY: No rashes. No lesions.  MUSCULOSKELETAL: No arthritis. No swelling. No gout.  NEUROLOGIC: No numbness, tingling, or ataxia. No seizure-type activity.  PSYCHIATRIC: No anxiety. No insomnia. No ADD.    Vitals:   Vitals:   07/23/17 1939 07/24/17 0500 07/24/17 0509 07/24/17 1236  BP: 134/64  (!) 153/62 (!) 171/63  Pulse: (!) 52  (!) 59 (!) 49  Resp: 16  16 18   Temp: 98.1 F (36.7 C)  (!) 97.4 F (36.3 C) 98.3 F (36.8 C)  TempSrc: Oral  Oral Oral  SpO2: 98%  97% 98%  Weight:  168 lb 11.2 oz (76.5 kg)    Height:        Wt Readings from Last 3 Encounters:  07/24/17 168 lb 11.2 oz (76.5 kg)  04/09/17 160 lb (72.6 kg)  07/08/16 163 lb 12.8 oz (74.3 kg)     Intake/Output Summary (Last 24 hours) at 07/24/2017 1305 Last data filed at 07/24/2017  0900 Gross per 24 hour  Intake 833.72 ml  Output 115 ml  Net 718.72 ml    Physical Exam:   GENERAL: Pleasant-appearing in no apparent distress.  HEAD, EYES, EARS, NOSE AND THROAT: Atraumatic, normocephalic. Extraocular muscles are intact. Pupils equal and reactive to light. Sclerae anicteric. No conjunctival injection. No oro-pharyngeal erythema.  NECK: Supple. There is no jugular venous distention. No bruits, no lymphadenopathy, no thyromegaly.  HEART: Regular rate and rhythm,. No murmurs, no rubs, no clicks.  LUNGS: Clear to auscultation bilaterally. No rales or rhonchi. No wheezes.  ABDOMEN: Soft, flat, nontender, nondistended. Has good bowel sounds. No hepatosplenomegaly appreciated.  EXTREMITIES: No evidence of any cyanosis, clubbing, or peripheral edema.  +2 pedal and radial pulses bilaterally.  NEUROLOGIC: The patient is alert, awake, and oriented x3 with no focal motor or sensory deficits appreciated bilaterally.  SKIN: Moist and warm with no rashes appreciated.  Psych: Not anxious, depressed LN: No inguinal LN enlargement    Antibiotics   Anti-infectives (From admission, onward)   Start     Dose/Rate Route Frequency Ordered Stop   07/24/17 2200  levofloxacin (LEVAQUIN) tablet 500 mg  Status:  Discontinued     500 mg Oral Every 48 hours 07/23/17 0250 07/23/17 1158   07/23/17 1200  levofloxacin (LEVAQUIN) IVPB 500 mg     500 mg 100 mL/hr over 60 Minutes Intravenous Every 48 hours 07/23/17 1158     07/23/17 1000  levofloxacin (LEVAQUIN) tablet 500 mg  Status:  Discontinued     500 mg Oral Daily 07/23/17 0021 07/23/17 0250   07/22/17 2145  levofloxacin (LEVAQUIN) IVPB 750 mg     750 mg 100 mL/hr over 90 Minutes Intravenous  Once 07/22/17 2136 07/22/17 2342      Medications   Scheduled Meds: . atorvastatin  10 mg Oral Daily  . carbidopa-levodopa  1 tablet Oral TID  . cholecalciferol  2,000 Units Oral Daily  . clopidogrel  75 mg Oral Daily  . docusate sodium  100 mg  Oral BID  . donepezil  5 mg Oral QHS  . DULoxetine  30 mg Oral Daily  . heparin  5,000 Units Subcutaneous Q8H  . levothyroxine  75 mcg Oral QAC breakfast   Continuous Infusions: . levofloxacin (LEVAQUIN) IV Stopped (07/23/17 1400)   PRN Meds:.acetaminophen **OR** acetaminophen, bisacodyl, HYDROcodone-acetaminophen, ondansetron **OR** ondansetron (ZOFRAN) IV, traZODone   Data Review:   Micro Results No results found for this or any previous visit (from the past 240 hour(s)).  Radiology Reports Dg Chest 1 View  Result Date: 07/22/2017 CLINICAL DATA:  Lethargy EXAM: CHEST 1 VIEW COMPARISON:  04/09/2017 FINDINGS: 2048 hours. Low lung volumes. The cardio pericardial silhouette is enlarged. Atelectasis or infiltrate noted at the right base. The visualized bony structures of the thorax are intact. Telemetry leads overlie the chest. IMPRESSION: Cardiomegaly with low volumes and right base atelectasis or infiltrate. Electronically Signed   By: Misty Stanley M.D.   On: 07/22/2017 21:20   Ct Head Wo Contrast  Result Date: 07/22/2017 CLINICAL DATA:  Altered level of consciousness. Weakness. Fall striking head. EXAM: CT HEAD WITHOUT CONTRAST TECHNIQUE: Contiguous axial images were obtained from the base of the skull through the vertex without intravenous contrast. COMPARISON:  Head CT 05/18/2017 FINDINGS: Brain: No intracranial hemorrhage, mass effect, or midline shift. Unchanged atrophy and chronic small vessel ischemia. No hydrocephalus, the basilar cisterns are patent. No evidence of territorial infarct or acute ischemia. No extra-axial or intracranial fluid collection. Vascular: Skullbase atherosclerosis without hyperdense vessel. Skull: No fracture or focal lesion. Sinuses/Orbits: Paranasal sinus mucosal thickening involving bilateral ethmoid air cells, both frontal sinuses and left maxillary sinus. Small fluid level in the left maxillary sinus. Bilateral cataract resection. Other:  None. IMPRESSION:  1. No acute intracranial abnormality. Unchanged atrophy and chronic small vessel ischemia. 2. Paranasal sinus inflammation with fluid level in the left maxillary sinus, new from prior exam. Electronically Signed   By: Jeb Levering M.D.   On: 07/22/2017 21:13     CBC Recent Labs  Lab 07/22/17 2037 07/23/17 0420  WBC 13.5* 11.2*  HGB 13.8 14.5  HCT 42.3 43.4  PLT 232 223  MCV 91.9 92.5  MCH 30.1 30.8  MCHC 32.7 33.3  RDW 16.0* 16.7*  LYMPHSABS 1.1  --   MONOABS 0.5  --   EOSABS 0.7  --   BASOSABS 0.0  --     Chemistries  Recent Labs  Lab 07/22/17 2037 07/23/17 0420  NA 136 142  K 3.8 4.0  CL 106 107  CO2 23 27  GLUCOSE 102* 102*  BUN 20 16  CREATININE 1.15 1.11  CALCIUM 9.7 9.7  AST 22  --   ALT 19  --   ALKPHOS 80  --   BILITOT 0.7  --    ------------------------------------------------------------------------------------------------------------------ estimated creatinine clearance is 46.3 mL/min (by C-G formula based on SCr of 1.11 mg/dL). ------------------------------------------------------------------------------------------------------------------ No results for input(s): HGBA1C in the last 72 hours. ------------------------------------------------------------------------------------------------------------------ No results for input(s): CHOL, HDL, LDLCALC, TRIG, CHOLHDL, LDLDIRECT in the last 72 hours. ------------------------------------------------------------------------------------------------------------------ No results for input(s): TSH, T4TOTAL, T3FREE, THYROIDAB in the last 72 hours.  Invalid input(s): FREET3 ------------------------------------------------------------------------------------------------------------------ No results for input(s): VITAMINB12, FOLATE, FERRITIN, TIBC, IRON, RETICCTPCT in the last 72 hours.  Coagulation profile No results for input(s): INR, PROTIME in the last 168 hours.  No results for input(s): DDIMER in the  last 72 hours.  Cardiac Enzymes Recent Labs  Lab 07/22/17 2037  TROPONINI <0.03   ------------------------------------------------------------------------------------------------------------------ Invalid input(s): POCBNP    Assessment & Plan   IMPRESSION AND PLAN:  1.  Right lower lobe community-acquired pneumonia.    Continue IV Levaquin, can switch to p.o. tomorrow 2.  Acute sinusitis,  continue IV Levaquin. 3.    Possible UTI urine culture shows multiple bacteria likely cystitis 4.  Dementia stable continue home meds. 5. Essential hypertension currently not on any medications monitor blood pressure 6.  Parkinson's disease continue carbidopa levodopa 7.  Previous history of stroke continue Plavix 8.  Hypothyroidism continue Synthroid   Disposition to rehab for respite care    Code Status Orders  (From admission, onward)        Start     Ordered   07/23/17 0154  Full code  Continuous     07/23/17 0153    Code Status History    Date Active Date Inactive Code Status Order ID Comments User Context   07/07/2016 05:34 07/08/2016 18:32 Full Code [id number]  Harrie Foreman, MD ED   12/20/2015 23:30 12/22/2015 20:25 Full Code [id number]  Lance Coon, MD Inpatient    Advance Directive Documentation     Most Recent Value  Type of Advance Directive  Healthcare Power of Attorney  Pre-existing out of facility DNR order (yellow form or pink MOST form)  No data  "MOST" Form in Place?  No data           Consults none  DVT Prophylaxis  Lovenox  Lab Results  Component Value Date   PLT 223 07/23/2017     Time Spent in minutes   35 minutes greater than 50% of time spent in care coordination and counseling patient regarding the condition and plan of care.   Dustin Flock M.D on 07/24/2017 at 1:05 PM  Between 7am to 6pm - Pager - [phone]  After 6pm go to [email or web address] - password [email or web address] [facility]   Office  [phone]

## 2017-07-24 NOTE — Plan of Care (Signed)
Patient is progressing and awaiting placement at respite [facility].  Fred Jones

## 2017-07-25 DIAGNOSIS — J159 Unspecified bacterial pneumonia: Secondary | ICD-10-CM | POA: Diagnosis not present

## 2017-07-25 DIAGNOSIS — J181 Lobar pneumonia, unspecified organism: Secondary | ICD-10-CM | POA: Diagnosis not present

## 2017-07-25 DIAGNOSIS — N3000 Acute cystitis without hematuria: Secondary | ICD-10-CM | POA: Diagnosis not present

## 2017-07-25 DIAGNOSIS — F0280 Dementia in other diseases classified elsewhere without behavioral disturbance: Secondary | ICD-10-CM | POA: Diagnosis not present

## 2017-07-25 DIAGNOSIS — K219 Gastro-esophageal reflux disease without esophagitis: Secondary | ICD-10-CM | POA: Diagnosis not present

## 2017-07-25 DIAGNOSIS — E785 Hyperlipidemia, unspecified: Secondary | ICD-10-CM | POA: Diagnosis not present

## 2017-07-25 DIAGNOSIS — I10 Essential (primary) hypertension: Secondary | ICD-10-CM | POA: Diagnosis not present

## 2017-07-25 DIAGNOSIS — F330 Major depressive disorder, recurrent, mild: Secondary | ICD-10-CM | POA: Diagnosis not present

## 2017-07-25 DIAGNOSIS — M6281 Muscle weakness (generalized): Secondary | ICD-10-CM | POA: Diagnosis not present

## 2017-07-25 DIAGNOSIS — Z8673 Personal history of transient ischemic attack (TIA), and cerebral infarction without residual deficits: Secondary | ICD-10-CM | POA: Diagnosis not present

## 2017-07-25 DIAGNOSIS — R296 Repeated falls: Secondary | ICD-10-CM | POA: Diagnosis not present

## 2017-07-25 DIAGNOSIS — J189 Pneumonia, unspecified organism: Secondary | ICD-10-CM | POA: Diagnosis not present

## 2017-07-25 DIAGNOSIS — G20 Parkinson's disease: Secondary | ICD-10-CM | POA: Diagnosis not present

## 2017-07-25 DIAGNOSIS — Z7902 Long term (current) use of antithrombotics/antiplatelets: Secondary | ICD-10-CM | POA: Diagnosis not present

## 2017-07-25 DIAGNOSIS — E039 Hypothyroidism, unspecified: Secondary | ICD-10-CM | POA: Diagnosis not present

## 2017-07-25 DIAGNOSIS — J019 Acute sinusitis, unspecified: Secondary | ICD-10-CM | POA: Diagnosis not present

## 2017-07-25 LAB — CBC
HCT: 42.6 % (ref 40.0–52.0)
HEMOGLOBIN: 14.3 g/dL (ref 13.0–18.0)
MCH: 30.9 pg (ref 26.0–34.0)
MCHC: 33.6 g/dL (ref 32.0–36.0)
MCV: 91.9 fL (ref 80.0–100.0)
PLATELETS: 243 10*3/uL (ref 150–440)
RBC: 4.64 MIL/uL (ref 4.40–5.90)
RDW: 16.2 % — ABNORMAL HIGH (ref 11.5–14.5)
WBC: 8.7 10*3/uL (ref 3.8–10.6)

## 2017-07-25 LAB — BASIC METABOLIC PANEL
ANION GAP: 7 (ref 5–15)
BUN: 18 mg/dL (ref 6–20)
CHLORIDE: 104 mmol/L (ref 101–111)
CO2: 25 mmol/L (ref 22–32)
Calcium: 9.6 mg/dL (ref 8.9–10.3)
Creatinine, Ser: 1.06 mg/dL (ref 0.61–1.24)
GFR calc Af Amer: >60 mL/min (ref >60)
Glucose, Bld: 144 mg/dL — ABNORMAL HIGH (ref 65–99)
Potassium: 3.6 mmol/L (ref 3.5–5.1)
SODIUM: 136 mmol/L (ref 135–145)

## 2017-07-25 LAB — GLUCOSE, CAPILLARY: Glucose-Capillary: 117 mg/dL — ABNORMAL HIGH (ref 65–99)

## 2017-07-25 MED ORDER — LEVOFLOXACIN IN D5W 750 MG/150ML IV SOLN
750.0000 mg | INTRAVENOUS | Status: DC
  Administered 2017-07-25: 13:00:00 750 mg via INTRAVENOUS
  Filled 2017-07-25: qty 150

## 2017-07-25 MED ORDER — AMOXICILLIN-POT CLAVULANATE 875-125 MG PO TABS: 1.0000 | ORAL_TABLET | Freq: Two times a day (BID) | ORAL | 0 refills | Status: AC

## 2017-07-25 NOTE — Progress Notes (Signed)
Patient discharged to [facility] via wheelchair by volunteer services. Madlyn Frankel, RN

## 2017-07-25 NOTE — Discharge Instructions (Signed)

## 2017-07-25 NOTE — Discharge Summary (Signed)
[place] at [place] NAME: Fred Jones    MR#:  [id number]  DATE OF BIRTH:  30-Aug-1930  DATE OF ADMISSION:  07/22/2017   ADMITTING PHYSICIAN: Amelia Jo, MD  DATE OF DISCHARGE:. 07/25/2017  PRIMARY CARE PHYSICIAN: Rusty Aus, MD   ADMISSION DIAGNOSIS:  Weakness [R53.1] Other acute sinusitis, recurrence not specified [J01.80] DISCHARGE DIAGNOSIS:  Active Problems:   CAP (community acquired pneumonia)  SECONDARY DIAGNOSIS:   Past Medical History:  Diagnosis Date  . Arthritis    fingers, neck  . Cancer of prostate ([place])   . Cancer of skin   . Depression   . GERD (gastroesophageal reflux disease)    rare  . Hypertension    Echo s/p fall 11/15  . Stroke [facility])    HOSPITAL COURSE:    1. Right lower lobe community-acquired pneumonia: Finish course of Abx 2.Acute sinusitis: finish course of Abx 3.UTI: based on UA, urine culture shows multiple bacteria likely cystitis, treated 4.Dementia stable continue home meds. 5. Essential hypertension: diet controlled 6.  Parkinson's disease continue carbidopa levodopa 7.  Previous history of stroke continue Plavix 8.  Hypothyroidism continue Synthroid DISCHARGE CONDITIONS:  stable CONSULTS OBTAINED:   DRUG ALLERGIES:  No Known Allergies DISCHARGE MEDICATIONS:   Allergies as of 07/25/2017   No Known Allergies     Medication List    TAKE these medications   amoxicillin-clavulanate 875-125 MG tablet Commonly known as:  AUGMENTIN Take 1 tablet by mouth 2 (two) times daily for 7 days.   atorvastatin 10 MG tablet Commonly known as:  LIPITOR Take 10 mg by mouth daily.   carbidopa-levodopa 25-250 MG tablet Commonly known as:  SINEMET IR Take 1 tablet by mouth 3 (three) times daily.   clopidogrel 75 MG tablet Commonly known as:  PLAVIX Take 1 tablet (75 mg total) by mouth daily.   donepezil 5 MG tablet Commonly known as:  ARICEPT Take 5 mg by mouth at bedtime.     DULoxetine 30 MG capsule Commonly known as:  CYMBALTA Take 30 mg by mouth daily.   levothyroxine 75 MCG tablet Commonly known as:  SYNTHROID, LEVOTHROID Take 75 mcg by mouth daily.   Vitamin D3 2000 units capsule Take 1 capsule by mouth daily.      DISCHARGE INSTRUCTIONS:   DIET:  Regular diet DISCHARGE CONDITION:  Stable ACTIVITY:  Activity as tolerated OXYGEN:  Home Oxygen: No.  Oxygen Delivery: room air DISCHARGE LOCATION:  nursing home   If you experience worsening of your admission symptoms, develop shortness of breath, life threatening emergency, suicidal or homicidal thoughts you must seek medical attention immediately by calling 911 or calling your MD immediately  if symptoms less severe.  You Must read complete instructions/literature along with all the possible adverse reactions/side effects for all the Medicines you take and that have been prescribed to you. Take any new Medicines after you have completely understood and accpet all the possible adverse reactions/side effects.   Please note  You were cared for by a hospitalist during your hospital stay. If you have any questions about your discharge medications or the care you received while you were in the hospital after you are discharged, you can call the unit and asked to speak with the hospitalist on call if the hospitalist that took care of you is not available. Once you are discharged, your primary care physician will handle any further medical issues. Please note that NO REFILLS for any discharge  medications will be authorized once you are discharged, as it is imperative that you return to your primary care physician (or establish a relationship with a primary care physician if you do not have one) for your aftercare needs so that they can reassess your need for medications and monitor your lab values.    On the day of Discharge:  VITAL SIGNS:  Blood pressure 137/66, pulse (!) 54, temperature 97.9 F (36.6  C), temperature source Oral, resp. rate 18, height 5\' 7"  (1.702 m), weight 75.8 kg (167 lb 3.2 oz), SpO2 96 %. PHYSICAL EXAMINATION:  GENERAL:  84 y.o.-year-old patient lying in the bed with no acute distress.  EYES: Pupils equal, round, reactive to light and accommodation. No scleral icterus. Extraocular muscles intact.  HEENT: Head atraumatic, normocephalic. Oropharynx and nasopharynx clear.  NECK:  Supple, no jugular venous distention. No thyroid enlargement, no tenderness.  LUNGS: Normal breath sounds bilaterally, no wheezing, rales,rhonchi or crepitation. No use of accessory muscles of respiration.  CARDIOVASCULAR: S1, S2 normal. No murmurs, rubs, or gallops.  ABDOMEN: Soft, non-tender, non-distended. Bowel sounds present. No organomegaly or mass.  EXTREMITIES: No pedal edema, cyanosis, or clubbing.  NEUROLOGIC: Cranial nerves II through XII are intact. Muscle strength 5/5 in all extremities. Sensation intact. Gait not checked.  PSYCHIATRIC: The patient is alert and oriented x 3.  SKIN: No obvious rash, lesion, or ulcer.  DATA REVIEW:   CBC Recent Labs  Lab 07/25/17 0930  WBC 8.7  HGB 14.3  HCT 42.6  PLT 243    Chemistries  Recent Labs  Lab 07/22/17 2037  07/25/17 0930  NA 136   < > 136  K 3.8   < > 3.6  CL 106   < > 104  CO2 23   < > 25  GLUCOSE 102*   < > 144*  BUN 20   < > 18  CREATININE 1.15   < > 1.06  CALCIUM 9.7   < > 9.6  AST 22  --   --   ALT 19  --   --   ALKPHOS 80  --   --   BILITOT 0.7  --   --    < > = values in this interval not displayed.     Contact information for follow-up providers    Rusty Aus, MD. Schedule an appointment as soon as possible for a visit in 1 week(s).   Specialty:  Internal Medicine Contact information: [place] [address] [phone]            Contact information for after-discharge care    Destination    [facility] Follow up.   Service:  Skilled Nursing Contact  information: [address] [place] [phone]                  Management plans discussed with the patient, family and they are in agreement.  CODE STATUS: FULL CODE  TOTAL TIME TAKING CARE OF THIS PATIENT: 45 minutes.    Max Sane M.D on 07/25/2017 at 12:50 PM  Between 7am to 6pm - Pager - [phone]  After 6pm go to [email or web address] - Proofreader  [facility]  Office  [phone]  CC: Primary care physician; Rusty Aus, MD   Note: This dictation was prepared with Dragon dictation along with smaller phrase technology. Any transcriptional errors that result from this process are unintentional.

## 2017-07-25 NOTE — Care Management Important Message (Signed)
Important Message  Patient Details  Name: Fred ULYSSE Sr. MRN: [id number] Date of Birth: 1931/02/12   Medicare Important Message Given:  Yes    Shelbie Ammons, RN 07/25/2017, 7:26 AM

## 2017-07-25 NOTE — Clinical Social Work Note (Addendum)
CSW received phone call from insurance company who has approved patient to go to [facility] number is [phone].  CSW updated SNF and physician, patient to be d/c'ed today to [facility].  Patient and family agreeable to plans will transport via ems RN to call report to [phone] room 346.  Evette Cristal, MSW, [employer]

## 2017-07-25 NOTE — Progress Notes (Signed)
Report called to [place], Therapist, sports at [facility] Family to transport. Madlyn Frankel, RN

## 2017-07-25 NOTE — Evaluation (Signed)
Physical Therapy Evaluation Patient Details Name: Fred Jones. MRN: [id number] DOB: 02/06/1931 Today's Date: 07/25/2017   History of Present Illness  Pt is an 84 y.o. male presenting to hospital with fatigue and irregular heartbeat.  Pt admitted to hospital with R [facility] community acquired PNA, acute sinusitis, and UTI.  PMH includes prostate CA, depression, skin CA, htn, stroke, Parkinson's disease.    Clinical Impression  Prior to hospital admission, pt was independent with ambulation (no assistive device); pt does have h/o falls.  Pt lives with his wife on main level of home but they are currently packing up everything and plan to be moved into [facility] by this Thursday.  Currently pt is SBA supine to sit (significant extra effort and time to scoot to edge of bed with vc's); CGA to min assist to stand with RW (pt unable to stand on own initially with RW and pt demonstrating posterior lean standing requiring assist to shift weight forward); and CGA to min assist for ambulation with RW to steady (3 loss of balance during ambulation requiring min to mod assist to regain balance).  Pt would benefit from skilled PT to address noted impairments and functional limitations (see below for any additional details).  Of note, pt did receive Parkinson medication earlier this morning prior to PT session.  Currently pt appears below baseline in terms of functional mobility and is at high risk of falls.  Upon hospital discharge, recommend pt discharge to STR to improve strength, balance, and independence with functional mobility.    Follow Up Recommendations SNF    Equipment Recommendations  Rolling walker with 5" wheels    Recommendations for Other Services       Precautions / Restrictions Precautions Precautions: Fall Restrictions Weight Bearing Restrictions: No      Mobility  Bed Mobility Overal bed mobility: Needs Assistance Bed Mobility: Supine to Sit      Supine to sit: Supervision     General bed mobility comments: bed flat; extra effort to sit up on edge of bed and then significant extra time and effort (and eventually requiring vc's for technique) to scoot to edge of bed (pt also used bottom post of bed to scoot forward)  Transfers Overall transfer level: Needs assistance Equipment used: Rolling walker (2 wheeled) Transfers: Sit to/from Stand Sit to Stand: Min assist         General transfer comment: pt attempted multiple times to stand on own but unable using RW; with vc's for LE and UE positioning and walker use, pt able to stand with CGA with min assist towards end of standing d/t posterior lean (LE's pushing against bed)  Ambulation/Gait Ambulation/Gait assistance: Min guard;Min assist;Mod assist Ambulation Distance (Feet): 75 Feet Assistive device: Rolling walker (2 wheeled)   Gait velocity: decreased   General Gait Details: shuffling gait; pt able to intermittently make changes to gait with vc's for longer step length and to stay closer and within the RW during ambulation;  3 loss of balance during ambulation requiring min to mod assist to steady (CGA to min assist to steady during the rest of ambulation)  Stairs            Wheelchair Mobility    Modified Rankin (Stroke Patients Only)       Balance Overall balance assessment: History of Falls;Needs assistance Sitting-balance support: Bilateral upper extremity supported;Feet supported Sitting balance-Leahy Scale: Fair Sitting balance - Comments: static sitting steady   Standing balance support: Bilateral  upper extremity supported Standing balance-Leahy Scale: Poor Standing balance comment: initial posterior lean standing with RW but then CGA with static standing with RW after that                             Pertinent Vitals/Pain Pain Assessment: No/denies pain  HR 58 bpm post ambulation; O2 WFL on room air.    Home Living Family/patient  expects to be discharged to:: Private residence Living Arrangements: Spouse/significant other Available Help at Discharge: Family Type of Home: House Home Access: Level entry     [place]: Two level;Able to live on main level with bedroom/bathroom Home Equipment: Gilford Rile - 2 wheels;Cane - single point      Prior Function Level of Independence: Independent with assistive device(s)         Comments: Pt reports ambulating without any AD (pt's daughter reports pt is encouraged to use an AD for ambulation but pt declines to use it).  Pt's daughter reports pt has had 5 falls in past week (anticipate d/t recent medical issues) but typically pt has about 2 falls per month.     Hand Dominance        Extremity/Trunk Assessment   Upper Extremity Assessment Upper Extremity Assessment: Overall WFL for tasks assessed(5/5 B UE shoulder flexion, elbow flexion/extension, and great B hand grip)    Lower Extremity Assessment Lower Extremity Assessment: Generalized weakness(4/5 B hip flexion, knee flexion/extension, and DF)    Cervical / Trunk Assessment Cervical / Trunk Assessment: Normal  Communication   Communication: No difficulties  Cognition Arousal/Alertness: Awake/alert Behavior During Therapy: WFL for tasks assessed/performed Overall Cognitive Status: Within Functional Limits for tasks assessed                                        General Comments General comments (skin integrity, edema, etc.): Pt resting in bed upon PT arrival; pt's daughter present.  Nursing cleared pt for participation in physical therapy.  Pt and pt's daughter agreeable to PT session.    Exercises  Gait and transfer training.   Assessment/Plan    PT Assessment Patient needs continued PT services  PT Problem List Decreased strength;Decreased balance;Decreased mobility;Decreased knowledge of use of DME;Decreased knowledge of precautions       PT Treatment Interventions DME  instruction;Gait training;Functional mobility training;Therapeutic activities;Therapeutic exercise;Balance training;Patient/family education    PT Goals (Current goals can be found in the Care Plan section)  Acute Rehab PT Goals Patient Stated Goal: to improve balance and independence with functional mobility PT Goal Formulation: With patient Time For Goal Achievement: 08/08/17 Potential to Achieve Goals: Good    Frequency Min 2X/week   Barriers to discharge Decreased caregiver support      Co-evaluation               AM-PAC PT "6 Clicks" Daily Activity  Outcome Measure Difficulty turning over in bed (including adjusting bedclothes, sheets and blankets)?: A Little Difficulty moving from lying on back to sitting on the side of the bed? : A Lot Difficulty sitting down on and standing up from a chair with arms (e.g., wheelchair, bedside commode, etc,.)?: Unable Help needed moving to and from a bed to chair (including a wheelchair)?: A Little Help needed walking in hospital room?: A Lot Help needed climbing 3-5 steps with a railing? : A Lot 6 Click  Score: 13    End of Session Equipment Utilized During Treatment: Gait belt Activity Tolerance: Patient tolerated treatment well Patient left: in chair;with call bell/phone within reach;with chair alarm set;with family/visitor present Nurse Communication: Mobility status;Precautions PT Visit Diagnosis: Other abnormalities of gait and mobility (R26.89);Unsteadiness on feet (R26.81);Repeated falls (R29.6);Muscle weakness (generalized) (M62.81);History of falling (Z91.81)    Time: 0900-0930 PT Time Calculation (min) (ACUTE ONLY): 30 min   Charges:   PT Evaluation $PT Eval Low Complexity: 1 Low PT Treatments $Gait Training: 8-22 mins   PT G Codes:   PT G-Codes **NOT FOR INPATIENT CLASS** Functional Assessment Tool Used: AM-PAC 6 Clicks Basic Mobility Functional Limitation: Mobility: Walking and moving around Mobility: Walking  and Moving Around Current Status (K3491): At least 40 percent but less than 60 percent impaired, limited or restricted Mobility: Walking and Moving Around Goal Status [phone]): At least 1 percent but less than 20 percent impaired, limited or restricted    Leitha Bleak, PT 07/25/17, 10:00 AM [phone]

## 2017-07-25 NOTE — Progress Notes (Signed)
Pharmacist-Provider Communication:  Order for levofloxacin 500 mg IV q48h was changed to levofloxacin 750 mg IV q48h based on renal function and indication. Per protocol.  Lenis Noon, PharmD 07/25/17 9:29 AM

## 2017-07-26 DIAGNOSIS — J159 Unspecified bacterial pneumonia: Secondary | ICD-10-CM | POA: Diagnosis not present

## 2017-07-26 DIAGNOSIS — G20 Parkinson's disease: Secondary | ICD-10-CM | POA: Diagnosis not present

## 2017-08-11 ENCOUNTER — Non-Acute Institutional Stay (SKILLED_NURSING_FACILITY): Payer: PPO | Admitting: Gerontology

## 2017-08-11 ENCOUNTER — Encounter: Payer: Self-pay | Admitting: Gerontology

## 2017-08-11 DIAGNOSIS — J189 Pneumonia, unspecified organism: Secondary | ICD-10-CM

## 2017-08-11 NOTE — Progress Notes (Signed)
Location:   [facility] Room Number: [phone] Place of Service:  SNF [phone]) Provider:  Toni Arthurs, NP-C  Rusty Aus, MD  Patient Care Team: Rusty Aus, MD as PCP - General (Internal Medicine)  Extended Emergency Contact Information Primary Emergency Contact: Etta Quill D Address: [address]          [place], [place] [address] Johnnette Litter of [place] Phone: [phone] Mobile Phone: [phone] Relation: Spouse Secondary Emergency Contact: Spiers,Jay Address: [address]          [place], [place] [address] Johnnette Litter of [place] Phone: [phone] Work Phone: [phone] Mobile Phone: [phone] Relation: Son  Code Status:  FULL Goals of care: Advanced Directive information Advanced Directives 08/11/2017  Does Patient Have a Medical Advance Directive? Yes  Type of Advance Directive [place]  Does patient want to make changes to medical advance directive? -  Copy of [place] in Chart? No - copy requested  Would patient like information on creating a medical advance directive? -     Chief Complaint  Patient presents with  . Discharge visit    Discharge visit    HPI:  Pt is a 84 y.o. male seen today for discharge evaluation. Pt was admitted to the facility for rehab following hospitalization at [facility] for CAP with weakness r/t Parkinsons. Pt has completed coarse of Augmentin. No cough or congestion. No shortness of breath. Pt has been participating in PT/OT. Pt denies pain. Pt reports his appetite is good, voiding well and having regular BMs. He reports he is feeling better and is anxious to go home. VSS. No other complaints.         Past Medical History:  Diagnosis Date  . Arthritis    fingers, neck  . Benign neoplasm of colon, unspecified   . Calculus of kidney   . Cancer of prostate [facility]) 12/21/2013   3/07 resection  . Cancer of skin   . Cerebrovascular accident (CVA) due  to embolism of left anterior cerebral artery ([facility]) 12/25/2015   Carotid negative, echo negative, right hand weakness, 6/17  . Chronic kidney disease, stage III (moderate) (HCC)   . Depression    unspecified  . Ganglion of tendon sheath   . GERD (gastroesophageal reflux disease)    rare  . History of urethral stricture    postoperative urethral stricture  . Hyperlipidemia, unspecified 12/21/2013  . Hypertension 12/31/2013   Echo s/p fall 11/15  . Kidney stone 12/21/2013  . Lumbar disc disease 12/21/2013  . Malignant neoplasm of prostate ([place])    08/2005 Gleason's  7 (3 4) left base, left mid medial, and left mid lateral. PSA 4.6 with a percent free PSA  . Melanoma ([place])    Stage I, s/p resection 12/2007  . Microscopic hematuria   . Nonspecific finding on examination of urine   . Other hammer toe(s) (acquired), unspecified foot   . Stress incontinence, male   . Stroke ([place])   . Unilateral inguinal hernia, without obstruction or gangrene, not specified as recurrent    unilateral or unspecified  . Vascular disorder of penis    Past Surgical History:  Procedure Laterality Date  . CATARACT EXTRACTION W/PHACO Right 11/27/2014   Procedure: CATARACT EXTRACTION PHACO AND INTRAOCULAR LENS PLACEMENT (IOC);  Surgeon: Leandrew Koyanagi, MD;  Location: [facility];  Service: Ophthalmology;  Laterality: Right;  . COLONOSCOPY     with removal of colonic polyps  .  EXTRACORPOREAL SHOCK WAVE LITHOTRIPSY Left 05/2012  . EYE SURGERY Left 09/25/14   cataract @[facility]   . HERNIA REPAIR  1999  . HERNIA REPAIR Bilateral 05/2008   Bilateral inguinal hernia repair --- Dr. Tamala Julian  . KIDNEY STONE SURGERY    . MELANOMA EXCISION  12/2007   melanoma resection  . RETROPUBIC PROSTATECTOMY  09/2005   Radical prostatectomy perineal - Dr. Jacqlyn Larsen  . WRIST GANGLION EXCISION Bilateral     No Known Allergies  Allergies as of 08/11/2017   No Known Allergies     Medication List        Accurate as of  08/11/17 12:09 PM. Always use your most recent med list.          atorvastatin 10 MG tablet Commonly known as:  LIPITOR Take 10 mg by mouth daily.   carbidopa-levodopa 25-250 MG tablet Commonly known as:  SINEMET IR Take 1 tablet by mouth 3 (three) times daily.   clopidogrel 75 MG tablet Commonly known as:  PLAVIX Take 75 mg by mouth daily.   donepezil 5 MG tablet Commonly known as:  ARICEPT Take 5 mg by mouth at bedtime.   DULoxetine 30 MG capsule Commonly known as:  CYMBALTA Take 30 mg by mouth daily.   levothyroxine 75 MCG tablet Commonly known as:  SYNTHROID, LEVOTHROID Take 75 mcg by mouth daily.   Vitamin D3 2000 units capsule Take 1 capsule by mouth daily.       Review of Systems  Immunization History  Administered Date(s) Administered  . Influenza Split 04/24/2014  . Influenza-Unspecified 04/18/2017  . Pneumococcal Polysaccharide-23 10/25/2003  . Tdap 08/05/2014  . Zoster 09/23/2005   Pertinent  Health Maintenance Due  Topic Date Due  . PNA vac Low Risk Adult (1 of 2 - PCV13) 12/26/1997  . INFLUENZA VACCINE  Completed   No flowsheet data found. Functional Status Survey:    Vitals:   08/11/17 1123  BP: 129/64  Pulse: (!) 51  Resp: 16  Temp: (!) 97.3 F (36.3 C)  TempSrc: Oral  SpO2: 95%  Weight: 164 lb 1.6 oz (74.4 kg)  Height: 5\' 7"  (1.702 m)   Body mass index is 25.7 kg/m. Physical Exam  Labs reviewed: Recent Labs    07/22/17 2037 07/23/17 0420 07/25/17 0930  NA 136 142 136  K 3.8 4.0 3.6  CL 106 107 104  CO2 23 27 25   GLUCOSE 102* 102* 144*  BUN 20 16 18   CREATININE 1.15 1.11 1.06  CALCIUM 9.7 9.7 9.6   Recent Labs    04/09/17 1523 05/18/17 1852 07/22/17 2037  AST 25 25 22   ALT 21 19 19   ALKPHOS 68 79 80  BILITOT 1.0 0.9 0.7  PROT 6.9 7.2 7.0  ALBUMIN 4.0 4.2 4.2   Recent Labs    04/09/17 1523 05/18/17 1852 07/22/17 2037 07/23/17 0420 07/25/17 0930  WBC 16.9* 10.7* 13.5* 11.2* 8.7  NEUTROABS 15.6* 4.9  11.2*  --   --   HGB 14.5 14.4 13.8 14.5 14.3  HCT 42.5 43.1 42.3 43.4 42.6  MCV 90.2 91.0 91.9 92.5 91.9  PLT 291 281 232 223 243   Lab Results  Component Value Date   TSH 3.631 07/06/2016   Lab Results  Component Value Date   HGBA1C 5.8 (H) 07/06/2016   Lab Results  Component Value Date   CHOL 179 12/21/2015   HDL 28 (L) 12/21/2015   LDLCALC 129 (H) 12/21/2015   TRIG 108 12/21/2015   CHOLHDL 6.4  12/21/2015    Significant Diagnostic Results in last 30 days:  Dg Chest 1 View  Result Date: 07/22/2017 CLINICAL DATA:  Lethargy EXAM: CHEST 1 VIEW COMPARISON:  04/09/2017 FINDINGS: 2048 hours. Low lung volumes. The cardio pericardial silhouette is enlarged. Atelectasis or infiltrate noted at the right base. The visualized bony structures of the thorax are intact. Telemetry leads overlie the chest. IMPRESSION: Cardiomegaly with low volumes and right base atelectasis or infiltrate. Electronically Signed   By: Misty Stanley M.D.   On: 07/22/2017 21:20   Ct Head Wo Contrast  Result Date: 07/22/2017 CLINICAL DATA:  Altered level of consciousness. Weakness. Fall striking head. EXAM: CT HEAD WITHOUT CONTRAST TECHNIQUE: Contiguous axial images were obtained from the base of the skull through the vertex without intravenous contrast. COMPARISON:  Head CT 05/18/2017 FINDINGS: Brain: No intracranial hemorrhage, mass effect, or midline shift. Unchanged atrophy and chronic small vessel ischemia. No hydrocephalus, the basilar cisterns are patent. No evidence of territorial infarct or acute ischemia. No extra-axial or intracranial fluid collection. Vascular: Skullbase atherosclerosis without hyperdense vessel. Skull: No fracture or focal lesion. Sinuses/Orbits: Paranasal sinus mucosal thickening involving bilateral ethmoid air cells, both frontal sinuses and left maxillary sinus. Small fluid level in the left maxillary sinus. Bilateral cataract resection. Other:  None. IMPRESSION: 1. No acute intracranial  abnormality. Unchanged atrophy and chronic small vessel ischemia. 2. Paranasal sinus inflammation with fluid level in the left maxillary sinus, new from prior exam. Electronically Signed   By: Jeb Levering M.D.   On: 07/22/2017 21:13    Assessment/Plan  Community acquired pneumonia, unspecified laterality   Continue PT/OT  Continue exercises as taught by PT/OT  Encourage ambulation  All other conditions stable  Continue current medication regimen  Follow up with PCP asap after discharge for continuity of care  Family/ staff Communication:   Total Time:  Documentation:  Face to Face:  Family/Phone:   Labs/tests ordered:    Patient is being discharged with the following home health services: HHPT/OT through [facility]    Patient is being discharged with the following durable medical equipment: none  Patient has been advised to f/u with their PCP in 1-2 weeks to bring them up to date on their rehab stay.  Social services at facility was responsible for arranging this appointment.  Pt was provided with a 30 day supply of prescriptions for medications and refills must be obtained from their PCP.  For controlled substances, a more limited supply may be provided adequate until PCP appointment only.  Medication list reviewed and assessed for continued appropriateness. Monthly medication orders reviewed and signed.  Vikki Ports, NP-C Geriatrics [facility] [phone], [place] [address] Cell Phone (Mon-Fri 8am-5pm):  [phone] On Call:  [phone] & follow prompts after 5pm & weekends Office Phone:  [phone] Office Fax:  [phone]

## 2017-08-15 DIAGNOSIS — R296 Repeated falls: Secondary | ICD-10-CM | POA: Diagnosis not present

## 2017-08-15 DIAGNOSIS — M6281 Muscle weakness (generalized): Secondary | ICD-10-CM | POA: Diagnosis not present

## 2017-08-15 DIAGNOSIS — F0280 Dementia in other diseases classified elsewhere without behavioral disturbance: Secondary | ICD-10-CM | POA: Diagnosis not present

## 2017-08-15 DIAGNOSIS — G20 Parkinson's disease: Secondary | ICD-10-CM | POA: Diagnosis not present

## 2017-08-16 DIAGNOSIS — Z9989 Dependence on other enabling machines and devices: Secondary | ICD-10-CM | POA: Diagnosis not present

## 2017-08-16 DIAGNOSIS — R4189 Other symptoms and signs involving cognitive functions and awareness: Secondary | ICD-10-CM | POA: Diagnosis not present

## 2017-08-16 DIAGNOSIS — G20 Parkinson's disease: Secondary | ICD-10-CM | POA: Diagnosis not present

## 2017-08-16 DIAGNOSIS — I63422 Cerebral infarction due to embolism of left anterior cerebral artery: Secondary | ICD-10-CM | POA: Diagnosis not present

## 2017-08-16 DIAGNOSIS — G4733 Obstructive sleep apnea (adult) (pediatric): Secondary | ICD-10-CM | POA: Diagnosis not present

## 2017-08-22 DIAGNOSIS — G20 Parkinson's disease: Secondary | ICD-10-CM | POA: Diagnosis not present

## 2017-08-22 DIAGNOSIS — F0280 Dementia in other diseases classified elsewhere without behavioral disturbance: Secondary | ICD-10-CM | POA: Diagnosis not present

## 2017-08-22 DIAGNOSIS — M6281 Muscle weakness (generalized): Secondary | ICD-10-CM | POA: Diagnosis not present

## 2017-08-22 DIAGNOSIS — R296 Repeated falls: Secondary | ICD-10-CM | POA: Diagnosis not present

## 2017-08-24 ENCOUNTER — Other Ambulatory Visit: Payer: Self-pay | Admitting: *Deleted

## 2017-08-24 NOTE — Patient Outreach (Addendum)
[facility]) Care Management  08/24/2017  Fred RESNIK Sr. 05-13-1931 [id number]   Patient referred to this social worker by the telephonic nurse for follow up while at the SNF. Phone call to patient, [facility] care management discussed. Per patient, he has just moved back in to the [place] at the [place] from skilled care and has verbalized having no social work needs at this time. Patient to be closed to social work at this time.  Plan: This Education officer, museum will refer patient to the telephonic RNCM.   Sheralyn Boatman [facility] Care Management [phone]

## 2017-08-24 NOTE — Patient Outreach (Signed)
[facility]) Care Management  08/24/2017  PERFECTO PURDY Sr. 20-Jan-1931 [id number]   Transition of Care Referral  Referral Date: 08/12/16 Referral Source: [facility] Date of Discharge: N/A Facility: [facility] Discharge Diagnosis: Pneumonia (PNA) Insurance: HTA  Outreach attempt #1 to patient. No answer. Patient was transitioned from the hospital to [facility] for rehab.   Plan: RN CM will send [facility] SW referral for possible assistance with transition from rehab to home.  Lake Bells, RN, BSN, MHA/MSL, [place] Telephonic Care Manager Coordinator [facility] Direct Phone: [phone] Toll Free: [phone] Fax: [phone]

## 2017-09-26 DIAGNOSIS — G20 Parkinson's disease: Secondary | ICD-10-CM | POA: Diagnosis not present

## 2017-09-29 DIAGNOSIS — G20 Parkinson's disease: Secondary | ICD-10-CM | POA: Diagnosis not present

## 2017-10-03 DIAGNOSIS — G20 Parkinson's disease: Secondary | ICD-10-CM | POA: Diagnosis not present

## 2017-10-06 DIAGNOSIS — Z9989 Dependence on other enabling machines and devices: Secondary | ICD-10-CM | POA: Diagnosis not present

## 2017-10-06 DIAGNOSIS — E559 Vitamin D deficiency, unspecified: Secondary | ICD-10-CM | POA: Diagnosis not present

## 2017-10-06 DIAGNOSIS — E782 Mixed hyperlipidemia: Secondary | ICD-10-CM | POA: Diagnosis not present

## 2017-10-06 DIAGNOSIS — G4733 Obstructive sleep apnea (adult) (pediatric): Secondary | ICD-10-CM | POA: Diagnosis not present

## 2017-10-06 DIAGNOSIS — I63422 Cerebral infarction due to embolism of left anterior cerebral artery: Secondary | ICD-10-CM | POA: Diagnosis not present

## 2017-10-06 DIAGNOSIS — I10 Essential (primary) hypertension: Secondary | ICD-10-CM | POA: Diagnosis not present

## 2017-10-07 DIAGNOSIS — G20 Parkinson's disease: Secondary | ICD-10-CM | POA: Diagnosis not present

## 2017-10-10 DIAGNOSIS — G20 Parkinson's disease: Secondary | ICD-10-CM | POA: Diagnosis not present

## 2017-10-12 DIAGNOSIS — G4733 Obstructive sleep apnea (adult) (pediatric): Secondary | ICD-10-CM | POA: Diagnosis not present

## 2017-10-12 DIAGNOSIS — I639 Cerebral infarction, unspecified: Secondary | ICD-10-CM | POA: Diagnosis not present

## 2017-10-13 DIAGNOSIS — G20 Parkinson's disease: Secondary | ICD-10-CM | POA: Diagnosis not present

## 2017-10-18 DIAGNOSIS — R300 Dysuria: Secondary | ICD-10-CM | POA: Diagnosis not present

## 2017-10-20 DIAGNOSIS — G20 Parkinson's disease: Secondary | ICD-10-CM | POA: Diagnosis not present

## 2017-10-24 DIAGNOSIS — G20 Parkinson's disease: Secondary | ICD-10-CM | POA: Diagnosis not present

## 2017-10-27 DIAGNOSIS — E782 Mixed hyperlipidemia: Secondary | ICD-10-CM | POA: Diagnosis not present

## 2017-10-27 DIAGNOSIS — Z125 Encounter for screening for malignant neoplasm of prostate: Secondary | ICD-10-CM | POA: Diagnosis not present

## 2017-10-27 DIAGNOSIS — G20 Parkinson's disease: Secondary | ICD-10-CM | POA: Diagnosis not present

## 2017-10-27 DIAGNOSIS — E559 Vitamin D deficiency, unspecified: Secondary | ICD-10-CM | POA: Diagnosis not present

## 2017-10-27 DIAGNOSIS — E039 Hypothyroidism, unspecified: Secondary | ICD-10-CM | POA: Diagnosis not present

## 2017-10-27 DIAGNOSIS — Z79899 Other long term (current) drug therapy: Secondary | ICD-10-CM | POA: Diagnosis not present

## 2017-10-31 DIAGNOSIS — G20 Parkinson's disease: Secondary | ICD-10-CM | POA: Diagnosis not present

## 2017-11-03 DIAGNOSIS — Z0000 Encounter for general adult medical examination without abnormal findings: Secondary | ICD-10-CM | POA: Diagnosis not present

## 2017-11-03 DIAGNOSIS — G20 Parkinson's disease: Secondary | ICD-10-CM | POA: Diagnosis not present

## 2017-11-03 DIAGNOSIS — I63422 Cerebral infarction due to embolism of left anterior cerebral artery: Secondary | ICD-10-CM | POA: Diagnosis not present

## 2017-11-10 DIAGNOSIS — G20 Parkinson's disease: Secondary | ICD-10-CM | POA: Diagnosis not present

## 2017-11-15 DIAGNOSIS — R4189 Other symptoms and signs involving cognitive functions and awareness: Secondary | ICD-10-CM | POA: Diagnosis not present

## 2017-11-15 DIAGNOSIS — G20 Parkinson's disease: Secondary | ICD-10-CM | POA: Diagnosis not present

## 2017-11-15 DIAGNOSIS — Z9989 Dependence on other enabling machines and devices: Secondary | ICD-10-CM | POA: Diagnosis not present

## 2017-11-15 DIAGNOSIS — G4733 Obstructive sleep apnea (adult) (pediatric): Secondary | ICD-10-CM | POA: Diagnosis not present

## 2017-11-17 DIAGNOSIS — G20 Parkinson's disease: Secondary | ICD-10-CM | POA: Diagnosis not present

## 2018-01-23 DIAGNOSIS — G4733 Obstructive sleep apnea (adult) (pediatric): Secondary | ICD-10-CM | POA: Diagnosis not present

## 2018-01-23 DIAGNOSIS — I639 Cerebral infarction, unspecified: Secondary | ICD-10-CM | POA: Diagnosis not present

## 2018-02-08 DIAGNOSIS — Z87440 Personal history of urinary (tract) infections: Secondary | ICD-10-CM | POA: Diagnosis not present

## 2018-02-10 DIAGNOSIS — S0993XA Unspecified injury of face, initial encounter: Secondary | ICD-10-CM | POA: Diagnosis not present

## 2018-02-10 DIAGNOSIS — Z7901 Long term (current) use of anticoagulants: Secondary | ICD-10-CM | POA: Diagnosis not present

## 2018-02-10 DIAGNOSIS — S80211A Abrasion, right knee, initial encounter: Secondary | ICD-10-CM | POA: Diagnosis not present

## 2018-02-10 DIAGNOSIS — W01190A Fall on same level from slipping, tripping and stumbling with subsequent striking against furniture, initial encounter: Secondary | ICD-10-CM | POA: Diagnosis not present

## 2018-02-10 DIAGNOSIS — S50311A Abrasion of right elbow, initial encounter: Secondary | ICD-10-CM | POA: Diagnosis not present

## 2018-02-13 DIAGNOSIS — R51 Headache: Secondary | ICD-10-CM | POA: Diagnosis not present

## 2018-02-13 DIAGNOSIS — W1800XA Striking against unspecified object with subsequent fall, initial encounter: Secondary | ICD-10-CM | POA: Diagnosis not present

## 2018-02-27 DIAGNOSIS — R5383 Other fatigue: Secondary | ICD-10-CM | POA: Diagnosis not present

## 2018-02-27 DIAGNOSIS — R2689 Other abnormalities of gait and mobility: Secondary | ICD-10-CM | POA: Diagnosis not present

## 2018-02-27 DIAGNOSIS — G20 Parkinson's disease: Secondary | ICD-10-CM | POA: Diagnosis not present

## 2018-03-05 IMAGING — CT CT HEAD W/O CM
3 series · 15 of 47 positions shown, 18 images · non-contrast
Comparison: 12/21/2015

CLINICAL DATA: Altered mental status. Difficulty walking today,
since [DATE].

EXAM:
CT HEAD WITHOUT CONTRAST
TECHNIQUE: Contiguous axial images were obtained from the base of the skull
through the vertex without intravenous contrast.

[Series 2: head wo · axial · 0.44mm/px · z∈[-71,+54]mm · 9 of 30 slices shown, 12 images]
[im 3/30  brain]
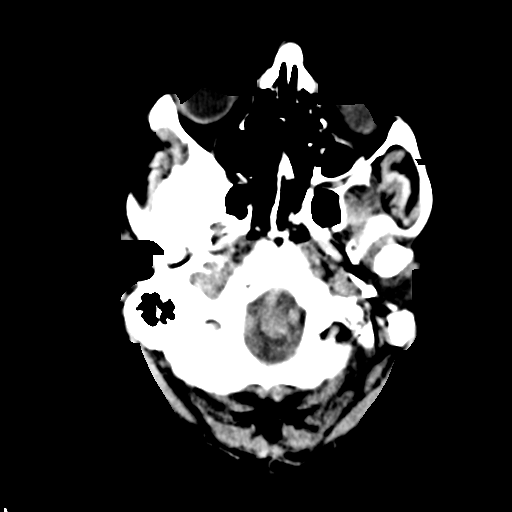
[im 3/30  bone]
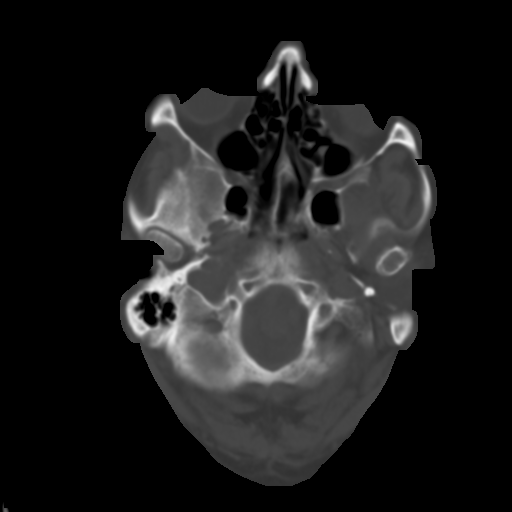
[im 6/30  brain]
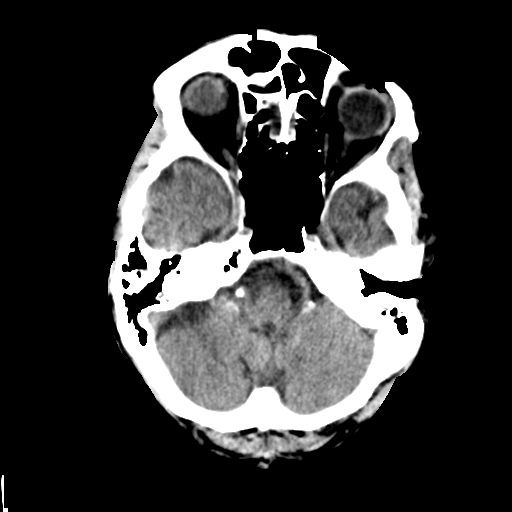
[im 9/30  brain]
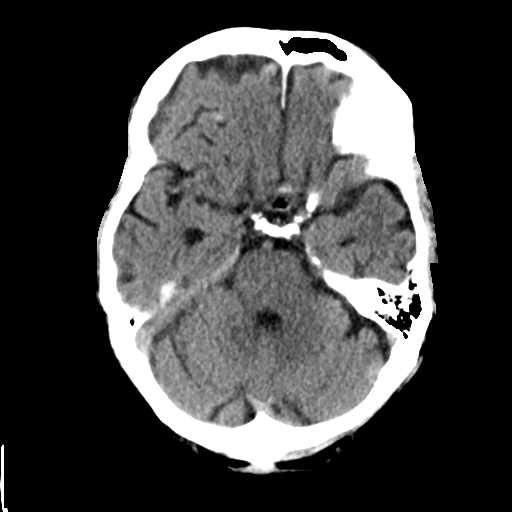
[im 12/30  brain]
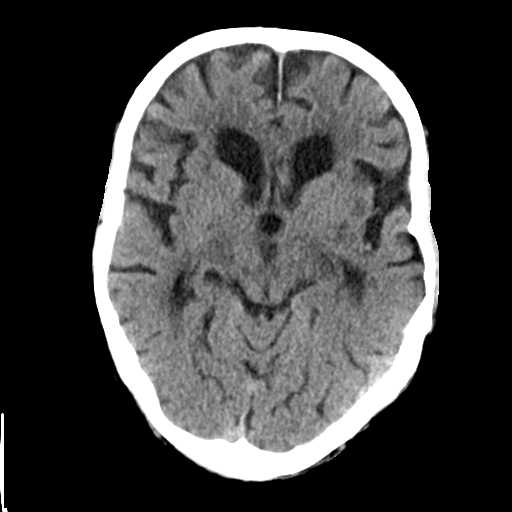
[im 16/30  brain]
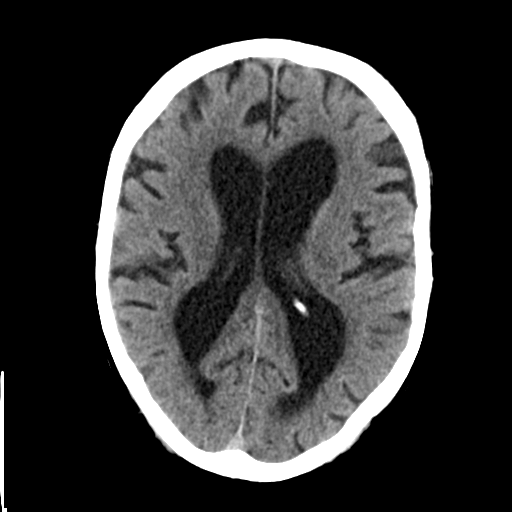
[im 16/30  bone]
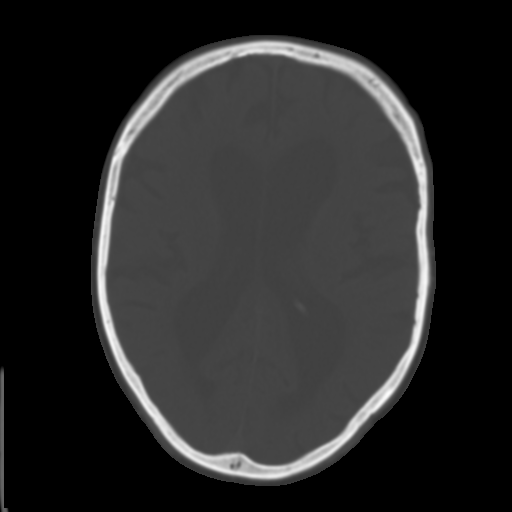
[im 19/30  brain]
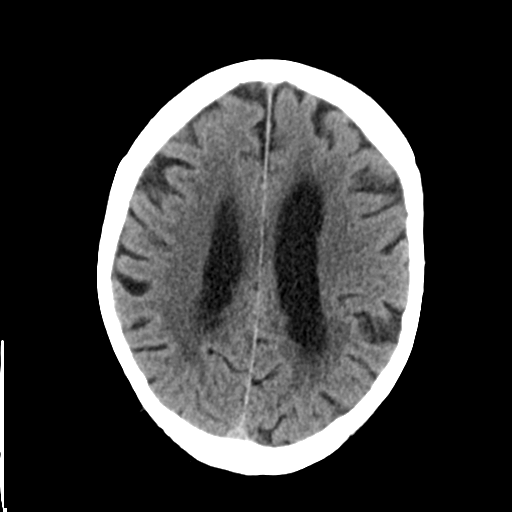
[im 22/30  brain]
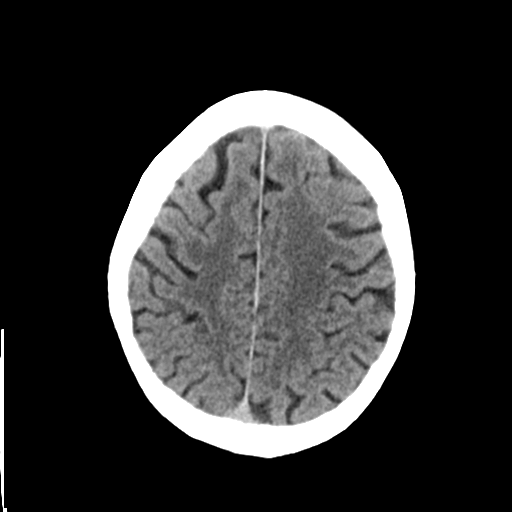
[im 25/30  brain]
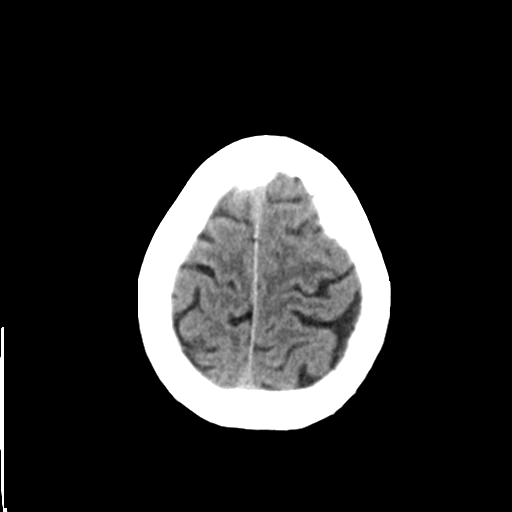
[im 28/30  brain]
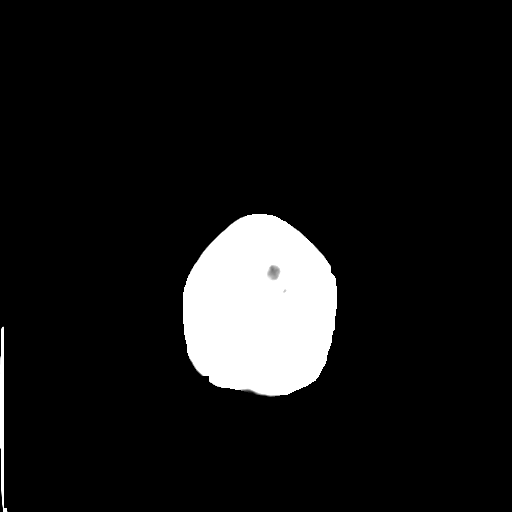
[im 28/30  bone]
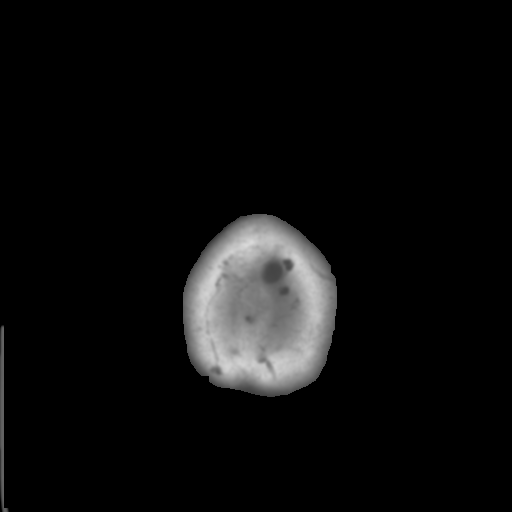

[Series 4: coronal soft tissue · coronal · 0.31mm/px · 3 of 66 slices shown]
[im 22/66  brain]
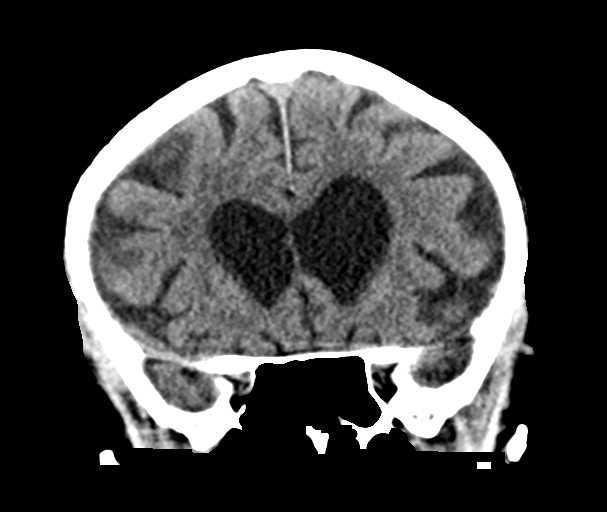
[im 29/66  brain]
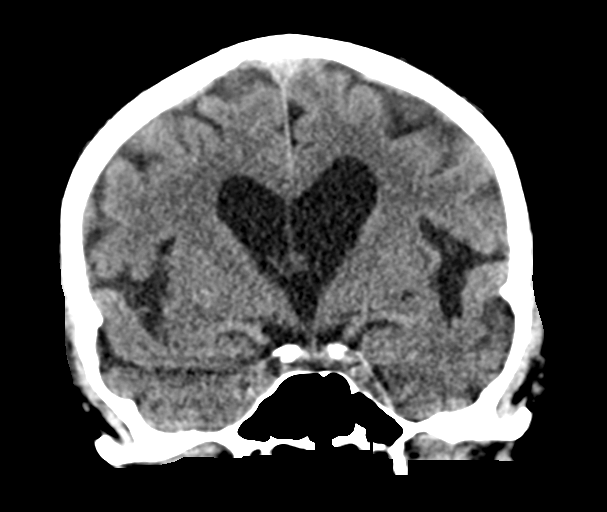
[im 37/66  brain]
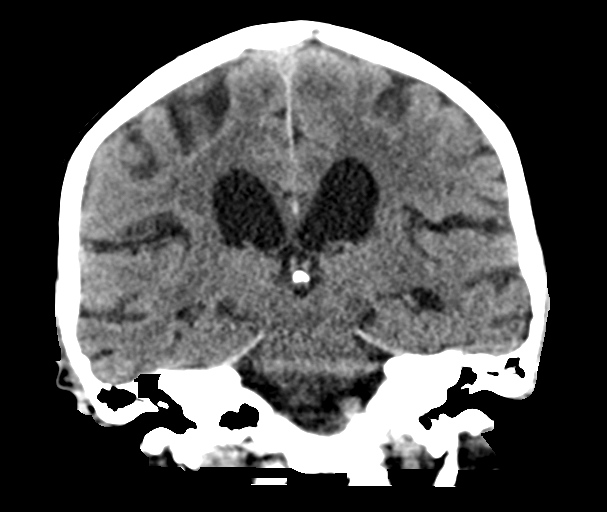

[Series 5: sagittal soft tissue · sagittal · 0.29mm/px · 3 of 50 slices shown]
[im 17/50  brain]
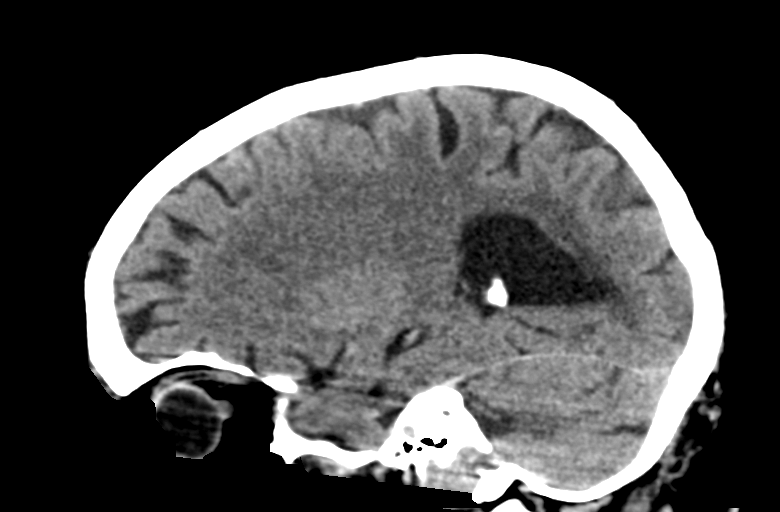
[im 25/50  brain]
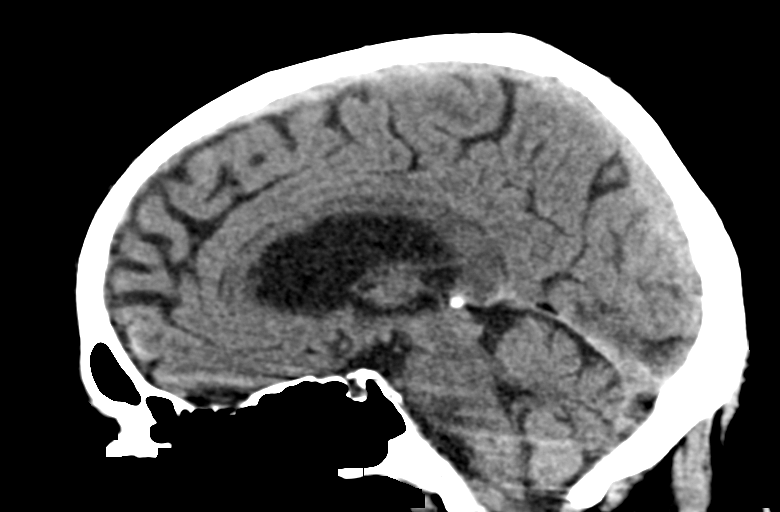
[im 33/50  brain]
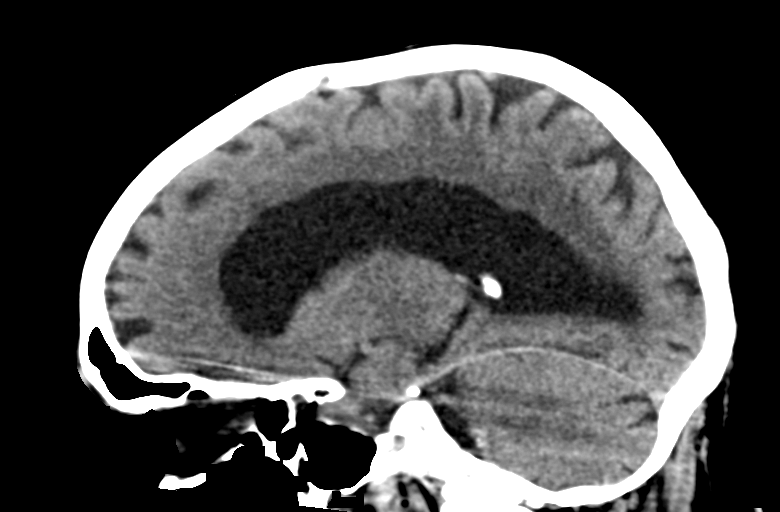

[15 of 47 positions shown; findings below may reference images not displayed]

FINDINGS: Brain:

There is no intracranial hemorrhage, mass or evidence of acute
infarction. There is moderate generalized atrophy. There is moderate
chronic microvascular ischemic change. There is no significant
extra-axial fluid collection.

No acute intracranial findings are evident.

Vascular: No hyperdense vessel or unexpected calcification.

Skull: Normal. Negative for fracture or focal lesion.

Sinuses/Orbits: No acute finding.

Other: None.
IMPRESSION: No acute intracranial findings. There is moderate generalized
atrophy and chronic appearing white matter hypodensities consistent
with small vessel ischemic disease.

## 2018-03-05 IMAGING — CR DG CHEST 2V
2 series · 2 of 2 positions shown · non-contrast
Comparison: Chest radiograph dated 02/10/2009

CLINICAL DATA: 85-year-old male with altered mental status and
weakness. Cough.

EXAM:
CHEST  2 VIEW

[chest lat]
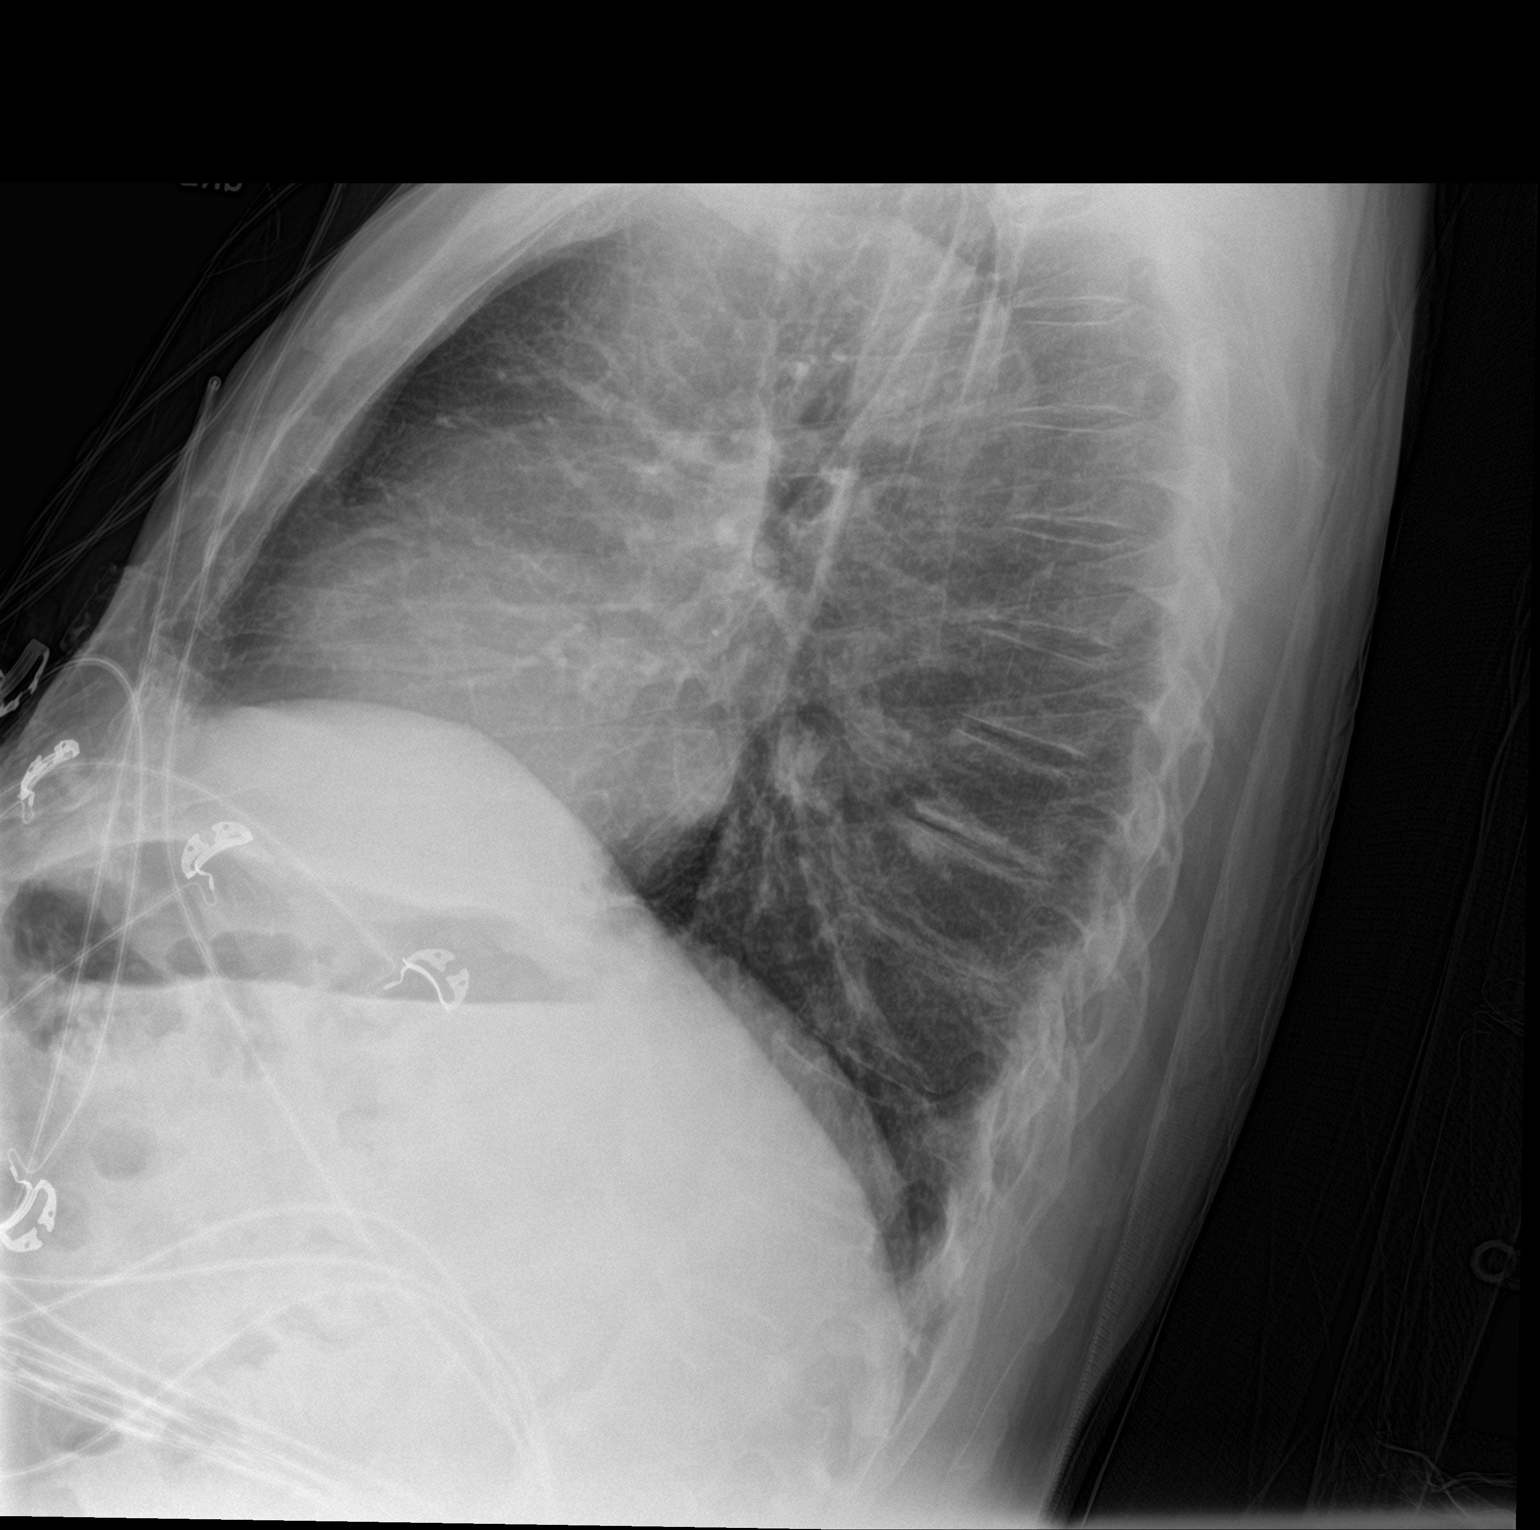

[chest ap]
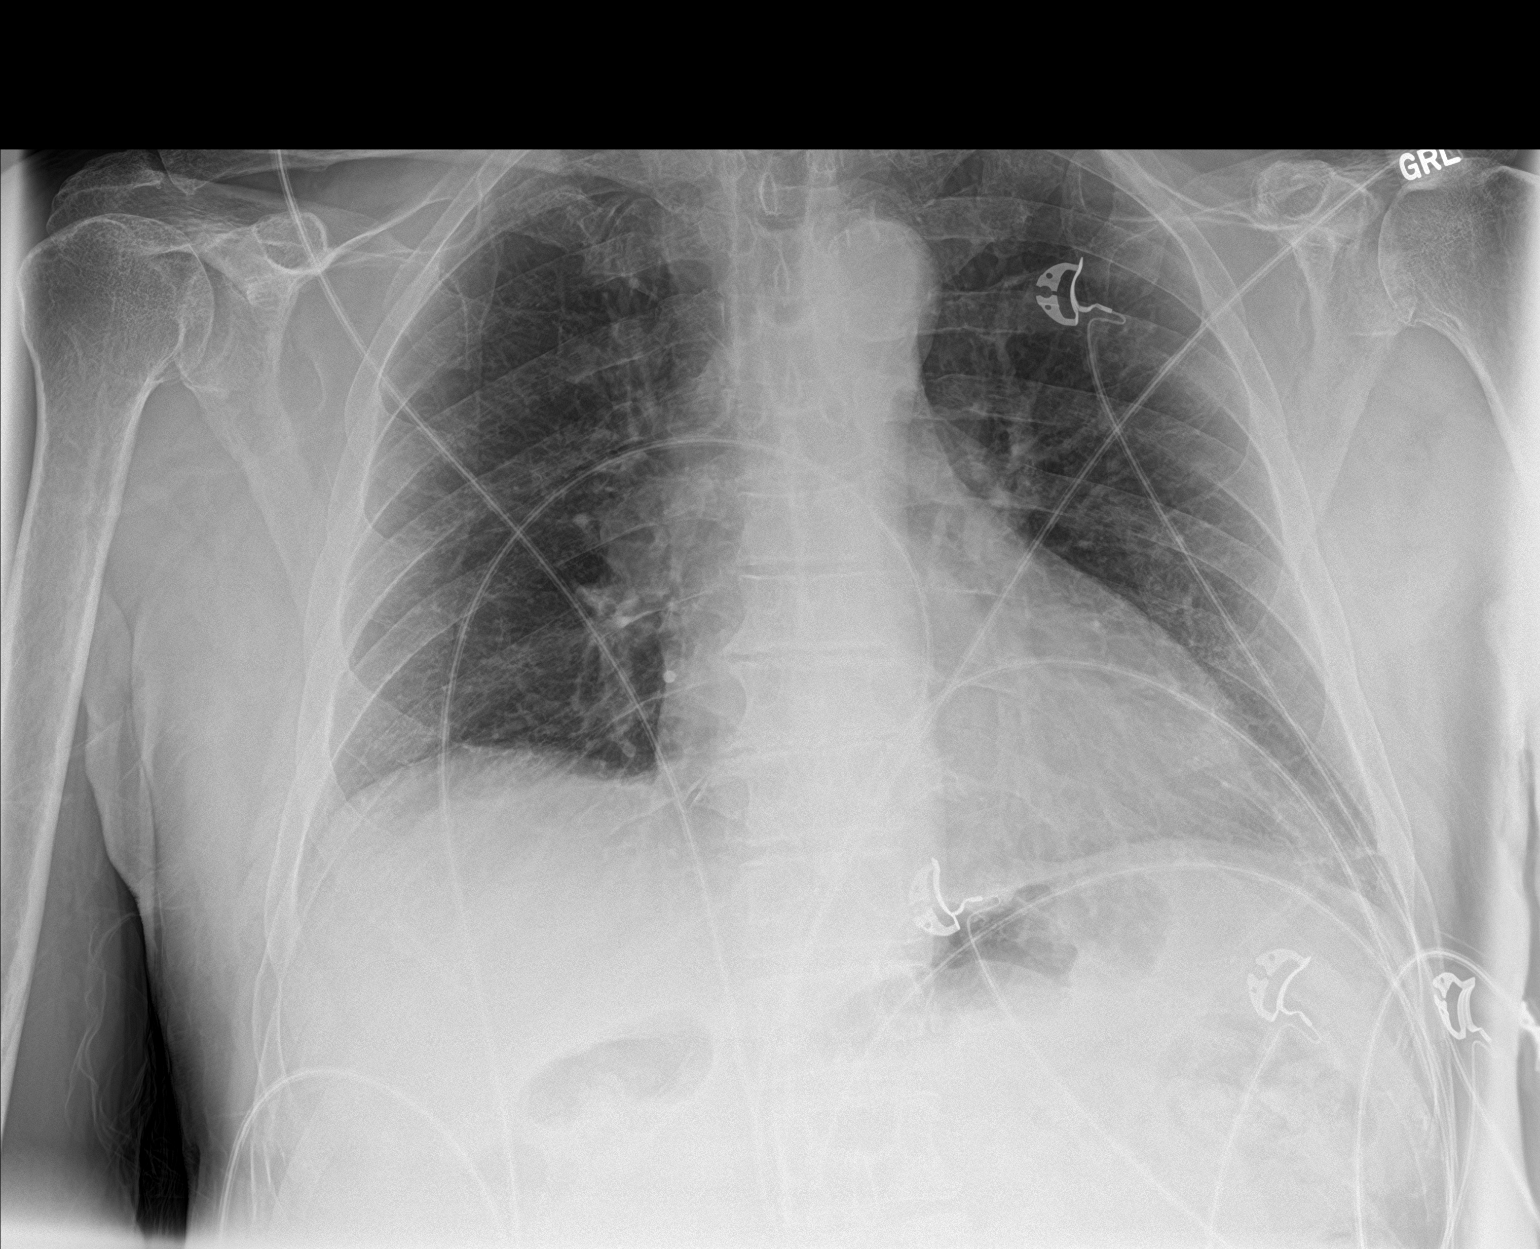

[2 of 2 positions shown; findings below may reference images not displayed]

FINDINGS: The lungs are clear. There is no pleural effusion or pneumothorax.
The cardiac silhouette is within normal limits. There is mild
atherosclerotic calcification of the aortic arch. No acute osseous
pathology.
IMPRESSION: No active cardiopulmonary disease.

## 2018-03-07 DIAGNOSIS — G20 Parkinson's disease: Secondary | ICD-10-CM | POA: Diagnosis not present

## 2018-03-07 DIAGNOSIS — R5382 Chronic fatigue, unspecified: Secondary | ICD-10-CM | POA: Diagnosis not present

## 2018-03-07 DIAGNOSIS — I63422 Cerebral infarction due to embolism of left anterior cerebral artery: Secondary | ICD-10-CM | POA: Diagnosis not present

## 2018-03-08 DIAGNOSIS — R42 Dizziness and giddiness: Secondary | ICD-10-CM | POA: Diagnosis not present

## 2018-03-08 DIAGNOSIS — Z9181 History of falling: Secondary | ICD-10-CM | POA: Diagnosis not present

## 2018-03-21 DIAGNOSIS — Z9989 Dependence on other enabling machines and devices: Secondary | ICD-10-CM | POA: Diagnosis not present

## 2018-03-21 DIAGNOSIS — G20 Parkinson's disease: Secondary | ICD-10-CM | POA: Diagnosis not present

## 2018-03-21 DIAGNOSIS — I63422 Cerebral infarction due to embolism of left anterior cerebral artery: Secondary | ICD-10-CM | POA: Diagnosis not present

## 2018-03-21 DIAGNOSIS — G4733 Obstructive sleep apnea (adult) (pediatric): Secondary | ICD-10-CM | POA: Diagnosis not present

## 2018-03-21 DIAGNOSIS — R4189 Other symptoms and signs involving cognitive functions and awareness: Secondary | ICD-10-CM | POA: Diagnosis not present

## 2018-03-28 DIAGNOSIS — Z9181 History of falling: Secondary | ICD-10-CM | POA: Diagnosis not present

## 2018-03-28 DIAGNOSIS — R42 Dizziness and giddiness: Secondary | ICD-10-CM | POA: Diagnosis not present

## 2018-04-05 DIAGNOSIS — Z9181 History of falling: Secondary | ICD-10-CM | POA: Diagnosis not present

## 2018-04-05 DIAGNOSIS — R42 Dizziness and giddiness: Secondary | ICD-10-CM | POA: Diagnosis not present

## 2018-04-07 DIAGNOSIS — Z9181 History of falling: Secondary | ICD-10-CM | POA: Diagnosis not present

## 2018-04-07 DIAGNOSIS — R42 Dizziness and giddiness: Secondary | ICD-10-CM | POA: Diagnosis not present

## 2018-04-12 DIAGNOSIS — R42 Dizziness and giddiness: Secondary | ICD-10-CM | POA: Diagnosis not present

## 2018-04-12 DIAGNOSIS — Z9181 History of falling: Secondary | ICD-10-CM | POA: Diagnosis not present

## 2018-04-13 DIAGNOSIS — Z9989 Dependence on other enabling machines and devices: Secondary | ICD-10-CM | POA: Diagnosis not present

## 2018-04-13 DIAGNOSIS — G4733 Obstructive sleep apnea (adult) (pediatric): Secondary | ICD-10-CM | POA: Diagnosis not present

## 2018-04-13 DIAGNOSIS — I63422 Cerebral infarction due to embolism of left anterior cerebral artery: Secondary | ICD-10-CM | POA: Diagnosis not present

## 2018-04-13 DIAGNOSIS — G20 Parkinson's disease: Secondary | ICD-10-CM | POA: Diagnosis not present

## 2018-04-13 DIAGNOSIS — E039 Hypothyroidism, unspecified: Secondary | ICD-10-CM | POA: Diagnosis not present

## 2018-04-13 DIAGNOSIS — I10 Essential (primary) hypertension: Secondary | ICD-10-CM | POA: Diagnosis not present

## 2018-04-14 DIAGNOSIS — R42 Dizziness and giddiness: Secondary | ICD-10-CM | POA: Diagnosis not present

## 2018-04-14 DIAGNOSIS — Z9181 History of falling: Secondary | ICD-10-CM | POA: Diagnosis not present

## 2018-04-15 ENCOUNTER — Emergency Department: Payer: PPO

## 2018-04-15 ENCOUNTER — Other Ambulatory Visit: Payer: Self-pay

## 2018-04-15 ENCOUNTER — Encounter: Payer: Self-pay | Admitting: Emergency Medicine

## 2018-04-15 ENCOUNTER — Emergency Department
Admission: EM | Admit: 2018-04-15 | Discharge: 2018-04-15 | Disposition: A | Discharge status: Home / Self Care | Payer: PPO | Attending: Student in an Organized Health Care Education/Training Program | Admitting: Student in an Organized Health Care Education/Training Program

## 2018-04-15 DIAGNOSIS — S51012A Laceration without foreign body of left elbow, initial encounter: Secondary | ICD-10-CM

## 2018-04-15 DIAGNOSIS — W01198A Fall on same level from slipping, tripping and stumbling with subsequent striking against other object, initial encounter: Secondary | ICD-10-CM | POA: Diagnosis not present

## 2018-04-15 DIAGNOSIS — Y939 Activity, unspecified: Secondary | ICD-10-CM | POA: Diagnosis not present

## 2018-04-15 DIAGNOSIS — R296 Repeated falls: Secondary | ICD-10-CM | POA: Diagnosis not present

## 2018-04-15 DIAGNOSIS — Z79899 Other long term (current) drug therapy: Secondary | ICD-10-CM | POA: Insufficient documentation

## 2018-04-15 DIAGNOSIS — S51011A Laceration without foreign body of right elbow, initial encounter: Secondary | ICD-10-CM

## 2018-04-15 DIAGNOSIS — Y929 Unspecified place or not applicable: Secondary | ICD-10-CM | POA: Diagnosis not present

## 2018-04-15 DIAGNOSIS — S51001A Unspecified open wound of right elbow, initial encounter: Secondary | ICD-10-CM | POA: Insufficient documentation

## 2018-04-15 DIAGNOSIS — Y999 Unspecified external cause status: Secondary | ICD-10-CM | POA: Diagnosis not present

## 2018-04-15 DIAGNOSIS — R600 Localized edema: Secondary | ICD-10-CM | POA: Diagnosis not present

## 2018-04-15 DIAGNOSIS — Z85820 Personal history of malignant melanoma of skin: Secondary | ICD-10-CM | POA: Diagnosis not present

## 2018-04-15 DIAGNOSIS — M25561 Pain in right knee: Secondary | ICD-10-CM | POA: Diagnosis not present

## 2018-04-15 DIAGNOSIS — N183 Chronic kidney disease, stage 3 (moderate): Secondary | ICD-10-CM | POA: Diagnosis not present

## 2018-04-15 DIAGNOSIS — I129 Hypertensive chronic kidney disease with stage 1 through stage 4 chronic kidney disease, or unspecified chronic kidney disease: Secondary | ICD-10-CM | POA: Diagnosis not present

## 2018-04-15 DIAGNOSIS — Z7902 Long term (current) use of antithrombotics/antiplatelets: Secondary | ICD-10-CM | POA: Insufficient documentation

## 2018-04-15 DIAGNOSIS — S51811A Laceration without foreign body of right forearm, initial encounter: Secondary | ICD-10-CM | POA: Diagnosis not present

## 2018-04-15 DIAGNOSIS — S53401A Unspecified sprain of right elbow, initial encounter: Secondary | ICD-10-CM | POA: Diagnosis not present

## 2018-04-15 DIAGNOSIS — Z23 Encounter for immunization: Secondary | ICD-10-CM | POA: Diagnosis not present

## 2018-04-15 DIAGNOSIS — S59902A Unspecified injury of left elbow, initial encounter: Secondary | ICD-10-CM | POA: Diagnosis present

## 2018-04-15 DIAGNOSIS — D126 Benign neoplasm of colon, unspecified: Secondary | ICD-10-CM | POA: Insufficient documentation

## 2018-04-15 DIAGNOSIS — Z7982 Long term (current) use of aspirin: Secondary | ICD-10-CM | POA: Insufficient documentation

## 2018-04-15 DIAGNOSIS — S53402A Unspecified sprain of left elbow, initial encounter: Secondary | ICD-10-CM | POA: Diagnosis not present

## 2018-04-15 DIAGNOSIS — W19XXXA Unspecified fall, initial encounter: Secondary | ICD-10-CM

## 2018-04-15 DIAGNOSIS — S51002A Unspecified open wound of left elbow, initial encounter: Secondary | ICD-10-CM | POA: Diagnosis not present

## 2018-04-15 DIAGNOSIS — Z8546 Personal history of malignant neoplasm of prostate: Secondary | ICD-10-CM | POA: Insufficient documentation

## 2018-04-15 DIAGNOSIS — S0990XA Unspecified injury of head, initial encounter: Secondary | ICD-10-CM | POA: Diagnosis not present

## 2018-04-15 DIAGNOSIS — S8991XA Unspecified injury of right lower leg, initial encounter: Secondary | ICD-10-CM | POA: Diagnosis not present

## 2018-04-15 DIAGNOSIS — M79631 Pain in right forearm: Secondary | ICD-10-CM | POA: Diagnosis not present

## 2018-04-15 MED ORDER — TETANUS-DIPHTH-ACELL PERTUSSIS 5-2.5-18.5 LF-MCG/0.5 IM SUSP
0.5000 mL | Freq: Once | INTRAMUSCULAR | Status: AC
  Administered 2018-04-15: 0.5 mL via INTRAMUSCULAR
  Filled 2018-04-15: qty 0.5

## 2018-04-15 NOTE — ED Provider Notes (Signed)
[facility] Emergency Department Provider Note  ____________________________________________   First MD Initiated Contact with Patient 04/15/18 1919     (approximate)  I have reviewed the triage vital signs and the nursing notes.   HISTORY  Chief Complaint Laceration    HPI Fred Brandow. is a 84 y.o. male presents emergency department after a fall about 2 hours ago.  He uses a walker but tripped over his feet and landed on concrete.  He has skin tears on both elbows.  Right knee pain.  And forearm pain.  He states he did not hit his head or lose consciousness.  However the patient does take Plavix and also a baby aspirin.  His wife states that he has many falls.  She tries to keep him from going outside to look at the flagpole but that is where he frequents and falls.   He and his family are unsure of his last tetanus   Past Medical History:  Diagnosis Date  . Arthritis    fingers, neck  . Benign neoplasm of colon, unspecified   . Calculus of kidney   . Cancer of prostate [facility]) 12/21/2013   3/07 resection  . Cancer of skin   . Cerebrovascular accident (CVA) due to embolism of left anterior cerebral artery ([place]) 12/25/2015   Carotid negative, echo negative, right hand weakness, 6/17  . Chronic kidney disease, stage III (moderate) (HCC)   . Depression    unspecified  . Ganglion of tendon sheath   . GERD (gastroesophageal reflux disease)    rare  . History of urethral stricture    postoperative urethral stricture  . Hyperlipidemia, unspecified 12/21/2013  . Hypertension 12/31/2013   Echo s/p fall 11/15  . Kidney stone 12/21/2013  . Lumbar disc disease 12/21/2013  . Malignant neoplasm of prostate ([place])    08/2005 Gleason's  7 (3 4) left base, left mid medial, and left mid lateral. PSA 4.6 with a percent free PSA  . Melanoma ([place])    Stage I, s/p resection 12/2007  . Microscopic hematuria   . Nonspecific finding on examination of urine   .  Other hammer toe(s) (acquired), unspecified foot   . Stress incontinence, male   . Stroke ([place])   . Unilateral inguinal hernia, without obstruction or gangrene, not specified as recurrent    unilateral or unspecified  . Vascular disorder of penis     Patient Active Problem List   Diagnosis Date Noted  . CAP (community acquired pneumonia) 07/23/2017  . Weakness 07/07/2016  . Stroke (cerebrum) ([place]) 12/20/2015  . HTN (hypertension) 12/20/2015  . GERD (gastroesophageal reflux disease) 12/20/2015  . Depression 12/20/2015    Past Surgical History:  Procedure Laterality Date  . CATARACT EXTRACTION W/PHACO Right 11/27/2014   Procedure: CATARACT EXTRACTION PHACO AND INTRAOCULAR LENS PLACEMENT (IOC);  Surgeon: Leandrew Koyanagi, MD;  Location: [facility];  Service: Ophthalmology;  Laterality: Right;  . COLONOSCOPY     with removal of colonic polyps  . EXTRACORPOREAL SHOCK WAVE LITHOTRIPSY Left 05/2012  . EYE SURGERY Left 09/25/14   cataract @[facility]   . HERNIA REPAIR  1999  . HERNIA REPAIR Bilateral 05/2008   Bilateral inguinal hernia repair --- Dr. Tamala Julian  . KIDNEY STONE SURGERY    . MELANOMA EXCISION  12/2007   melanoma resection  . RETROPUBIC PROSTATECTOMY  09/2005   Radical prostatectomy perineal - Dr. Jacqlyn Larsen  . WRIST GANGLION EXCISION Bilateral     Prior to Admission medications  Medication Sig Start Date End Date Taking? Authorizing Provider  atorvastatin (LIPITOR) 10 MG tablet Take 10 mg by mouth daily.     [provider]  carbidopa-levodopa (SINEMET IR) 25-250 MG tablet Take 1 tablet by mouth 3 (three) times daily.  06/14/16   [provider]  Cholecalciferol (VITAMIN D3) 2000 units capsule Take 1 capsule by mouth daily.    [provider]  clopidogrel (PLAVIX) 75 MG tablet Take 75 mg by mouth daily.    [provider]  donepezil (ARICEPT) 5 MG tablet Take 5 mg by mouth at bedtime.    [provider]  DULoxetine (CYMBALTA) 30  MG capsule Take 30 mg by mouth daily.  07/05/16   [provider]  levothyroxine (SYNTHROID, LEVOTHROID) 75 MCG tablet Take 75 mcg by mouth daily.  03/02/17   [provider]    Allergies Patient has no known allergies.  Family History  Problem Relation Age of Onset  . Cancer Unknown   . Stroke Unknown   . Heart attack Unknown   . Stroke Mother   . Cancer Mother   . Heart attack Mother   . Stroke Father   . Cancer Father   . Cancer Sister   . Prostate cancer Brother   . GU problems Neg Hx   . Kidney disease Neg Hx     Social History Social History   Tobacco Use  . Smoking status: Never Smoker  . Smokeless tobacco: Never Used  Substance Use Topics  . Alcohol use: Yes    Alcohol/week: 4.0 standard drinks    Types: 4 Glasses of wine per week    Comment: occasionally  . Drug use: No    Review of Systems  Constitutional: No fever/chills Eyes: No visual changes. ENT: No sore throat. Respiratory: Denies cough Genitourinary: Negative for dysuria. Musculoskeletal: Negative for back pain.  Positive for right knee and right forearm pain. Skin: Negative for rash.  Positive for skin tears on the elbows    ____________________________________________   PHYSICAL EXAM:  VITAL SIGNS: ED Triage Vitals  Enc Vitals Group     BP 04/15/18 1822 (!) 134/93     Pulse Rate 04/15/18 1822 66     Resp 04/15/18 1822 20     Temp 04/15/18 1822 97.7 F (36.5 C)     Temp Source 04/15/18 1822 Oral     SpO2 04/15/18 1822 99 %     Weight 04/15/18 1825 166 lb (75.3 kg)     Height 04/15/18 1825 5\' 7"  (1.702 m)     Head Circumference --      Peak Flow --      Pain Score 04/15/18 1824 0     Pain Loc --      Pain Edu? --      Excl. in [place]? --     Constitutional: Alert and oriented. Well appearing and in no acute distress. Eyes: Conjunctivae are normal.  Head: Atraumatic. Nose: No congestion/rhinnorhea. Mouth/Throat: Mucous membranes are moist.   Neck:  supple no  lymphadenopathy noted Cardiovascular: Normal rate, regular rhythm. Heart sounds are normal Respiratory: Normal respiratory effort.  No retractions, lungs c t a  GU: deferred Musculoskeletal: The right knee is swollen and tender.  The right forearm is mildly tender distally.  Both elbows are negative for bony tenderness and the patient has full range of motion.   Neurologic:  Normal speech and language.  Patient is talking and laughing with his family.  Neurological exam  is negative Skin:  Skin is warm, dry . No rash noted.  Positive for skin tears to both elbows Psychiatric: Mood and affect are normal. Speech and behavior are normal.  ____________________________________________   LABS (all labs ordered are listed, but only abnormal results are displayed)  Labs Reviewed - No data to display ____________________________________________   ____________________________________________  RADIOLOGY  CT of the head due to the Plavix is negative X-ray of the right knee is negative X-ray of the right forearm is negative  ____________________________________________   PROCEDURES  Procedure(s) performed: Steri-Strips and Dermabond applied to the left elbow.  Dressing was applied to the right elbow as the skin is not intact  Procedures    ____________________________________________   INITIAL IMPRESSION / ASSESSMENT AND PLAN / ED COURSE  Pertinent labs & imaging results that were available during my care of the patient were reviewed by me and considered in my medical decision making (see chart for details).   Patient is 84 year old male presents emergency department after fall.  He is on Plavix.  Has abrasions to both elbows and is complaining of right forearm and right knee pain.  On physical exam patient appears well and is talkative.  He is able to answer all questions appropriately.  The abrasions on the right elbow do not have any skin left to adhere.  The left elbow has a large  skin tear.  The right forearm is tender, the right knee is tender, C-spine is not tender  X-ray of the right knee is negative, x-ray of the right forearm is negative CT of the head is negative for any acute intracranial abnormality  Discussed all of the imaging results with the patient and his family.  The skin tear and abrasion were cleaned.  A dressing was applied to the abrasion.  Steri-Strips and Dermabond were applied to the skin tear on the left elbow.  Tdap was given in the ED.  Patient is to follow-up with his regular doctor for any other problems.  He is to return the emergency department if any worsening symptoms.  His family was given care instructions of the Steri-Strips/Dermabond and dressings to the open wound.  They all state they understand.  He was discharged in stable condition in the care of his family.     As part of my medical decision making, I reviewed the following data within the [facility] notes reviewed and incorporated, Old chart reviewed, Radiograph reviewed CT the head negative, x-ray of the right knee and right forearm negative, Notes from prior ED visits and [place] Controlled Substance Database  ____________________________________________   FINAL CLINICAL IMPRESSION(S) / ED DIAGNOSES  Final diagnoses:  Skin tear of right elbow without complication, initial encounter  Fall, initial encounter  Skin tear of elbow without complication, left, initial encounter      NEW MEDICATIONS STARTED DURING THIS VISIT:  Discharge Medication List as of 04/15/2018  8:41 PM       Note:  This document was prepared using Dragon voice recognition software and may include unintentional dictation errors.    Versie Starks, PA-C 04/16/18 0005    Merlyn Lot, MD 04/16/18 [phone]

## 2018-04-15 NOTE — ED Notes (Signed)
Pt  Fell  approx  2  Hour  Ago  He uses a walker he has skin tears to both elbows   Did not black oy  - takes plavix and baby  [place]  On  Concrete did not  Hit his head

## 2018-04-15 NOTE — Discharge Instructions (Addendum)
Follow-up with your regular doctor in 3 to 5 days as needed.  If the wound is reopened and applied the bandage that she been given to the areas.  Try and keep them as dry as possible for the next 2 to 3 days.  Return to the emergency department if you have any worsening symptoms from your fall.

## 2018-04-15 NOTE — ED Triage Notes (Signed)
Mechanical fall, tripped and fell about 1 hour ago, lacerations both elbows. States uses plavix.

## 2018-04-18 DIAGNOSIS — W57XXXD Bitten or stung by nonvenomous insect and other nonvenomous arthropods, subsequent encounter: Secondary | ICD-10-CM | POA: Diagnosis not present

## 2018-04-18 DIAGNOSIS — G20 Parkinson's disease: Secondary | ICD-10-CM | POA: Diagnosis not present

## 2018-04-18 DIAGNOSIS — Y92009 Unspecified place in unspecified non-institutional (private) residence as the place of occurrence of the external cause: Secondary | ICD-10-CM | POA: Diagnosis not present

## 2018-04-18 DIAGNOSIS — S59902D Unspecified injury of left elbow, subsequent encounter: Secondary | ICD-10-CM | POA: Diagnosis not present

## 2018-04-19 DIAGNOSIS — Z9181 History of falling: Secondary | ICD-10-CM | POA: Diagnosis not present

## 2018-04-19 DIAGNOSIS — R42 Dizziness and giddiness: Secondary | ICD-10-CM | POA: Diagnosis not present

## 2018-04-21 DIAGNOSIS — Z9181 History of falling: Secondary | ICD-10-CM | POA: Diagnosis not present

## 2018-04-21 DIAGNOSIS — R42 Dizziness and giddiness: Secondary | ICD-10-CM | POA: Diagnosis not present

## 2018-04-24 ENCOUNTER — Other Ambulatory Visit: Payer: Self-pay | Admitting: *Deleted

## 2018-04-24 NOTE — Patient Outreach (Addendum)
[facility]) Care Management  04/24/2018  Fred JEANPAUL Sr. Mar 01, 1931 [id number]   Telephone Screen  Referral Date: 04/21/18 Referral Source: HTA referral  Referral Reason: Resources to get a neuro walker  Insurance: HTA    Outreach attempt # 1 successful to the home number  Patient's wife is able to verify HIPAA Reviewed and addressed referral to [facility] with patient's wife and caregiver His wife clarifies that the order from the MD is for a U step 2 walker ("with lights on the front") not at neuro walker. She confirms they would prefer to get the DME from [facility] if they carry the item because they also have obtained a CPAP from [facility]  04/24/18 1658 Returned a call to Fred Jones  Fred Jones was able to verify HIPAA His voice is noted to be raspy Re-reviewed referral to [facility] with patient and provided an update on calls made to various agencies. Discussed there would be an out of pocket expense and most agencies are reporting they do not stock the item but it may have to be ordered He voiced understanding and gave CM permission to further speak with his wife, Fred Jones, during this call and any time CM needs assistance His wife states they also called various DME companies but no one has a U Step 2 and she was informed it would have to be ordered.  She states they had "fired [employer]" but would work with them again if they had the DME.  She states she is aware that they would have to pay 20% if the DME company is in network with HTA   Social: Fred Diodato lives at home with his wife and caregiver.  He needs assist with iADLs and is independent/assist with ADLs. His wife and caregiver assist with transportation to medical appointments DME CPAP Conditions: stroke, HTN, hyperlipidemia, hypothyroidism, dementia, CAP, GERD, depression , weakness, parkinson, chronic fatigue, falls Medications: denies concerns with taking medications as prescribed, affording medications,  side effects of medications and questions about medications  Appointments: to see [name], primary MD on 07/05/18, to see Dr Ubaldo Glassing, cardiology on 10/11/18, to see neurology,  Dr Sunday Corn 05/18/18   Consent: [facility] RN CM reviewed [facility] services with patient. Patient gave verbal consent for services.  Plan: [facility] RN CM to continue to contact providers from the list of in network providers for DME and update Fred Decarlo prn   [facility] RN CM called [facility] [employer] in [place] and [facility] re[facility] staff to discussed processing and obtaining this DME [facility] RN CM consulted with [facility] RN CM colleagues  Amado Coe at the [employer] care store confirms they do not carry the DME and placed Cm on hold to consult other staff.  Jeneen Rinks of [facility] shared that medicare covers this DME for neuro issues but there is generally an out of pocket expense. Jeneen Rinks confirms [facility] does not carry this DME item.  He recommends [employer] or [employer]. Jeneen Rinks states he has received calls from the pt's wife and the [place] home care office staff today  [place] staff in [place] confirms they do not carry this DME Angie and Robin at [facility] rehab consulted and recommended [facility].  Cathie Beams, HTA concierge consulted to obtain the list of in network DME providers that provide walkers as [place], [employer] [place], [place], [employer], [facility], [facility] and [facility]  [employer]. Lavina Hamman,  RN, BSN, [place] Coordinator Office number [phone] Mobile number [phone]  Main [facility]N number [phone] Fax number [phone]

## 2018-04-25 ENCOUNTER — Other Ambulatory Visit: Payer: Self-pay | Admitting: *Deleted

## 2018-04-25 NOTE — Patient Outreach (Signed)
[facility]) Care Management  04/25/2018  AMEEN MOSTAFA Sr. Jul 07, 1931 [id number]   Care coordination  [facility] RN CM called various agencies in network and out of network to attempt to get assist with purchase or ordering of an U step 2 walker for this patient  CM able to find an in [place] in [place] [phone] that can assist with ordering if a prescription or order is faxed from the MD to [employer] at [name] in [place] ([phone] out of network) states that [name] who assist with ordering this DME is not availalbe but he can call the patient in Dr Coble's absence to offer assist   Outreach attempt # 1 to update the patient and wife and to get their response for choice of agency prior proceeding with requesting a order be faxed to one of these agencies  No answer. [facility] RN CM left HIPAA compliant voicemail message along with CM's contact info. Cm requested a return call   Plan: [facility] RN CM scheduled this patient for another call attempt within 4 business days CM routed note to Primary MD   [place]. Lavina Hamman, RN, BSN, [place] Coordinator Office number [phone] Mobile number [phone]  Main THN number [phone] Fax number [phone]

## 2018-04-26 ENCOUNTER — Other Ambulatory Visit: Payer: Self-pay | Admitting: *Deleted

## 2018-04-26 DIAGNOSIS — Z9989 Dependence on other enabling machines and devices: Secondary | ICD-10-CM | POA: Diagnosis not present

## 2018-04-26 DIAGNOSIS — G20 Parkinson's disease: Secondary | ICD-10-CM | POA: Diagnosis not present

## 2018-04-26 DIAGNOSIS — G4733 Obstructive sleep apnea (adult) (pediatric): Secondary | ICD-10-CM | POA: Diagnosis not present

## 2018-04-26 DIAGNOSIS — F0280 Dementia in other diseases classified elsewhere without behavioral disturbance: Secondary | ICD-10-CM | POA: Diagnosis not present

## 2018-04-26 DIAGNOSIS — F0150 Vascular dementia without behavioral disturbance: Secondary | ICD-10-CM | POA: Diagnosis not present

## 2018-04-26 DIAGNOSIS — G309 Alzheimer's disease, unspecified: Secondary | ICD-10-CM | POA: Diagnosis not present

## 2018-04-26 DIAGNOSIS — F329 Major depressive disorder, single episode, unspecified: Secondary | ICD-10-CM | POA: Diagnosis not present

## 2018-04-26 DIAGNOSIS — I63422 Cerebral infarction due to embolism of left anterior cerebral artery: Secondary | ICD-10-CM | POA: Diagnosis not present

## 2018-04-26 DIAGNOSIS — R2689 Other abnormalities of gait and mobility: Secondary | ICD-10-CM | POA: Diagnosis not present

## 2018-04-26 NOTE — Patient Outreach (Addendum)
[facility]) Care Management  04/26/2018  Fred BOSCHERT Sr. 04/23/1931 [id number]   Care coordination/Follow up to DME providers for U step 2 walker   [name] RN CM received a voice message left by [name], [name]. She requests a return cal to the [name] for follow up   [place] returned the call to the [name] Patient and [name] are able to verify HIPAA CM reviewed CM interventions from 04/25/18 concluding with a discussing of the companies that stated there was availability to assist with ordering this DME for them  [name] reports her mobile number [phone] is the best number to reach them both She reports having difficulty hearing on the home number line   She reports [name]'s neurology and primary care providers both gave prescriptions for the U step 2 with details. She attempted to find the sheet given to her this am  The neurologist, [name] was seen this am  [facility] RN CM called back to the [name] choice of medical supply store, [name] in [place].  [name] shared that the U step 2 walker is an out of pocket DME because it is not coded (therefore using an in network or out of network DME provider does not matter), the general cost is about $850 out of pocket and she generally has 1-2 in the store but at this time does not have any.    This was explained to the [name] [name] was provided the address and contact number for [name]. The [name] want to discuss this further and contact their coverage provider before making a decision to purchase the U step 2 walker from [name] in [place].   [name] RN CM discussed routing this note to [name] and [name].  CM discussed with them that an order or prescription would need to be taken or faxed to the company of their choice.  The [name] at this time deny the need to have the information for the [place] in [place]   [name] voiced her appreciation for [name] RN CM's assistance and services rendered.   Consent: [facility] RN CM reviewed [facility] services with patient. Patient gave verbal consent for services.  Pl[name] RN CM will await a return call from the [place], Route this note to [name] and primary MD.   [name] RN CM will return a call to [name] within 7 days if no return call from him or his wife   Joelene Millin L. Lavina Hamman, RN, BSN, [place] Coordinator Office number [phone] Mobile number [phone]  Main THN number [phone] Fax number [phone]

## 2018-04-28 DIAGNOSIS — R42 Dizziness and giddiness: Secondary | ICD-10-CM | POA: Diagnosis not present

## 2018-04-28 DIAGNOSIS — Z9181 History of falling: Secondary | ICD-10-CM | POA: Diagnosis not present

## 2018-05-03 ENCOUNTER — Other Ambulatory Visit: Payer: Self-pay | Admitting: *Deleted

## 2018-05-03 NOTE — Patient Outreach (Signed)
[facility]) Care Management  05/03/2018  Fred MARCOUX Sr. 1931-06-19 [id number]   Care coordination  [facility] RN CM called to Patient's wife mobile number since she reports it is the best telephone in their home that allows them to hear better  Patient's wife is able to verify HIPAA Reviewed and addressed the reason for the follow up call   Fred Jones informed CM that they have not been abe to make any progress with getting the U Step 2 Fred Jones is stating she still needs an "okay from the insurance company" about paying 70-80% for in network or out of network DME cost   Fred Jones reports she has contacted [employer] staff and spoke with 2 different staff but is pending a return call  Fred Jones reports that she received a call from [name] from [name] in [place] on this morning (05/03/18) to discuss a $900+ U step 2  CM again reminded Fred Jones that [name] discussed on 04/26/18 that the U step 2 would be an out of pocket DME and the network status of the company that it is purchased from does not matter related to the code for the U Step 2.  Fred Jones stated she understood this, wants a response from HTA and then wants to decide if it would be best to use an online company for a lower cost. [name] was seen on 04/26/18 and discussed the U step 2 walker with [name] and provided contact information to include [email or web address] [phone] 9-5 pm CT   Plan [facility] RN CM is pending a call from HTA staff  [facility] RN CM will contact Mr & Fred Comas after contact with HTA staff and offer to place a conference call with the [name] to Ustep prn to gather the information needed about the purchase of DME within 4 business days  [facility] RN CM called HTA and spoke with Santiago Glad who will send T. Ernst Spell, a message. [facility] RN Cm discussed Fred Lince concerns related to a call from HTA and wanting assist with paying for DME.  [facility] RN CM left CM contact  number for [place]. Lavina Hamman, RN, BSN, [place] Coordinator Office number [phone] Mobile number [phone]  Main THN number [phone] Fax number [phone]

## 2018-05-04 ENCOUNTER — Other Ambulatory Visit: Payer: Self-pay | Admitting: *Deleted

## 2018-05-04 NOTE — Patient Outreach (Signed)
[facility] [facility]) Care Management  05/04/2018  Fred Jones Sr. 08-21-1930 [id number]   Care coordination   [facility] RN CM received calls from [place]. CM collaborated with HTA staff to review the progress of attempting to get [name] an U step 2 walker Discussed [name] concerns with wanting a percentage of the cost of this DME to be provided by HTA and request of a call from HTA staff. Discussed the voiced coding and cost responses from [name] of [name] in [place] to speak with [name] about [name] code found for this DME and filing with HTA medicare for dx of Parkinson Contact numbers provided and confirmed for [name] and [name], neurologist.  CM referred [name] to [name] 04/26/18 office visit note and DME Rx. CM discussed the initial Rx came from [name] per [name].  Plan [facility] RN CM will continue to collaborate with HTA staff, DME suppliers and contact [name]  within 4 business days for updates/follow up   [employer]. Lavina Hamman, RN, BSN, [place] Coordinator Office number [phone] Mobile number [phone]  Main THN number [phone] Fax number [phone]

## 2018-05-05 ENCOUNTER — Other Ambulatory Visit: Payer: Self-pay | Admitting: *Deleted

## 2018-05-05 DIAGNOSIS — R42 Dizziness and giddiness: Secondary | ICD-10-CM | POA: Diagnosis not present

## 2018-05-05 DIAGNOSIS — Z9181 History of falling: Secondary | ICD-10-CM | POA: Diagnosis not present

## 2018-05-05 NOTE — Patient Outreach (Signed)
[place] [facility]) Care Management  05/05/2018  Fred Jones Sr. 10-Jun-1931 [id number]   Care coordination  [facility] RN CM followed up with Mr & Mrs Meals as discussed  [name] reports they have not heard from HTA staff  CM discussed contact received from HTA staff on 05/04/18  Plans  [facility] RN CM will continue to collaborate with HTA staff, DME suppliers and contact [name]  within 4 business days for updates/follow up    [employer]. Lavina Hamman, RN, BSN, [place] Coordinator Office number [phone] Mobile number [phone]  Main THN number [phone] Fax number [phone]

## 2018-05-09 ENCOUNTER — Ambulatory Visit: Payer: Self-pay | Admitting: *Deleted

## 2018-05-11 ENCOUNTER — Ambulatory Visit: Payer: Self-pay | Admitting: *Deleted

## 2018-05-12 ENCOUNTER — Other Ambulatory Visit: Payer: Self-pay | Admitting: *Deleted

## 2018-05-12 NOTE — Patient Outreach (Signed)
[facility]) Care Management  05/12/2018  CHUNG CHAGOYA Sr. 10-01-1930 [id number]   Care coordination   [facility] RN CM called to speak with HTA concierge Left a voice message for T [place] Pending a call from [place] RN CM will call pt and wife for follow up  [place]. Lavina Hamman, RN, BSN, [place] Coordinator Office number [phone] Mobile number [phone]  Main [facility]N number [phone] Fax number [phone]

## 2018-05-12 NOTE — Patient Outreach (Signed)
[facility]) Care Management  05/12/2018  RESHARD GUILLET Sr. January 17, 1931 [id number]   Care coordination  Return call from HTA staff,  T [place] Discussed progress An authorization needed at HTA from neurologist for DME   Plan [facility] RN CM will continue to collaborate with patient, wife, HTA UM and neurologist prn   [facility] RN CM called the neurologist office but the office does not receive calls after 12 noon on Fridays. CM was unable to leave a message. CM sent an Epic in basket to [name], Neurologist requesting HTA be contacted for DME authorization.  [place] Lavina Hamman, RN, BSN, [place] Coordinator Office number [phone] Mobile number [phone]  Main [facility]N number [phone] Fax number [phone]

## 2018-05-16 ENCOUNTER — Other Ambulatory Visit: Payer: Self-pay | Admitting: *Deleted

## 2018-05-16 DIAGNOSIS — R531 Weakness: Secondary | ICD-10-CM | POA: Diagnosis not present

## 2018-05-16 DIAGNOSIS — G20 Parkinson's disease: Secondary | ICD-10-CM | POA: Diagnosis not present

## 2018-05-16 DIAGNOSIS — R296 Repeated falls: Secondary | ICD-10-CM | POA: Diagnosis not present

## 2018-05-16 DIAGNOSIS — F0280 Dementia in other diseases classified elsewhere without behavioral disturbance: Secondary | ICD-10-CM | POA: Diagnosis not present

## 2018-05-16 NOTE — Patient Outreach (Addendum)
[facility]) Care Management  05/16/2018  Fred DILGER Sr. February 25, 1931 [id number]   Care coordination  [facility] RN CM received a call from Clarise Cruz, HTA UM. Discussed the call CM placed to neur  Spoke with Nevin Bloodgood at [name] office who states she would send a message to [name] RN or NP and a message to the staff that does authorizations  CM left HTA and CM's contact number T [place] updated   [facility] RN CM updated [facility] leadership related to this patient on 05/15/18   Plan [facility] RN CM willcontinue to collaborate with HTA staff, DME suppliers and neurology MD staff prn  [name] has been and will be updated by HTA UM staff    Joelene Millin L. Lavina Hamman, RN, BSN, [place] Coordinator Office number [phone] Mobile number [phone]  Main [facility] number [phone] Fax number [phone]

## 2018-05-17 DIAGNOSIS — G4733 Obstructive sleep apnea (adult) (pediatric): Secondary | ICD-10-CM | POA: Diagnosis not present

## 2018-05-17 DIAGNOSIS — I639 Cerebral infarction, unspecified: Secondary | ICD-10-CM | POA: Diagnosis not present

## 2018-05-19 ENCOUNTER — Other Ambulatory Visit: Payer: Self-pay | Admitting: *Deleted

## 2018-05-19 DIAGNOSIS — F0280 Dementia in other diseases classified elsewhere without behavioral disturbance: Secondary | ICD-10-CM | POA: Diagnosis not present

## 2018-05-19 DIAGNOSIS — G20 Parkinson's disease: Secondary | ICD-10-CM | POA: Diagnosis not present

## 2018-05-19 DIAGNOSIS — R296 Repeated falls: Secondary | ICD-10-CM | POA: Diagnosis not present

## 2018-05-19 DIAGNOSIS — R531 Weakness: Secondary | ICD-10-CM | POA: Diagnosis not present

## 2018-05-19 NOTE — Patient Outreach (Signed)
  [facility]) Care Management  05/19/2018  SASCHA BAUGHER Sr. Dec 18, 1930 [id number]   Care coordination/collaboration with neurology office staff  [facility] RN CM received a call from Wells Guiles, RN at [name] office responding to CM message from last week & this week about need for assistance with authorization for U step 2 walker for this patient CM updated her on the interventions for this patient to locate a DME agency of their choice- [employer] at [address] [place] at [phone], need for an authorization and HTA needing to be called Provided HTA contact and the number to call Wells Guiles returned a call after she dialed the incorrect number for [employer].   Plan [facility] RN CM willcontinue to collaborate with HTA staff, DME suppliers and neurology MD staff prn   Mickel Crow. Lavina Hamman, RN, BSN, [place] Coordinator Office number [phone] Mobile number [phone]  Main [facility] number [phone] Fax number [phone]

## 2018-06-02 ENCOUNTER — Other Ambulatory Visit: Payer: Self-pay | Admitting: *Deleted

## 2018-06-02 NOTE — Patient Outreach (Addendum)
[facility]) Care Management  06/02/2018  [place] [birth year] [id number]   Care coordination/case closure   Call to the patient's mobile numbers without success  Able to reach Mr & Mrs Wanninger at the home number   Mrs Klonowski is able to verify HIPAA Inquired about an update on U step walker and other possible needs    Mrs Earlie Server confirmed they got the u step walker "a few weeks ago and it is working great. I recommend it to anyone who will need it."  They report not using [name]. Their daughter ordered it off of [facility] for $500 less than the cost recommended by the staff at [employer] supply. They have submitted their receipt and MD information to [employer] for reimbursement (still pending) Mrs Gorje Iyer was appreciative of [facility] RN CM assistance in the process    Consent: [facility] RN CM reviewed [facility] services with them They gave verbal consent for services. Mrs Krauser denied need of services from [facility]/Telephonic RN CM, pharmacy, health coach, NP or SW at this time   Plan [facility] RN CM will close case at this time as patient has been assessed and no needs identified.    Pt encouraged to return a call to [facility] RN CM prn  Routed note to primary MD and neurology MD   Joelene Millin L. Lavina Hamman, RN, BSN, [place] Coordinator Office number [phone] Mobile number [phone]  Main [facility] number [phone] Fax number [phone]

## 2018-06-08 ENCOUNTER — Emergency Department: Payer: PPO

## 2018-06-08 ENCOUNTER — Encounter: Payer: Self-pay | Admitting: Emergency Medicine

## 2018-06-08 ENCOUNTER — Other Ambulatory Visit: Payer: Self-pay

## 2018-06-08 ENCOUNTER — Emergency Department
Admission: EM | Admit: 2018-06-08 | Discharge: 2018-06-08 | Disposition: A | Discharge status: Home / Self Care | Payer: PPO | Attending: Emergency Medicine | Admitting: Emergency Medicine

## 2018-06-08 DIAGNOSIS — J9811 Atelectasis: Secondary | ICD-10-CM | POA: Diagnosis not present

## 2018-06-08 DIAGNOSIS — N183 Chronic kidney disease, stage 3 (moderate): Secondary | ICD-10-CM | POA: Diagnosis not present

## 2018-06-08 DIAGNOSIS — R5381 Other malaise: Secondary | ICD-10-CM | POA: Insufficient documentation

## 2018-06-08 DIAGNOSIS — R102 Pelvic and perineal pain: Secondary | ICD-10-CM | POA: Diagnosis not present

## 2018-06-08 DIAGNOSIS — I129 Hypertensive chronic kidney disease with stage 1 through stage 4 chronic kidney disease, or unspecified chronic kidney disease: Secondary | ICD-10-CM | POA: Diagnosis not present

## 2018-06-08 DIAGNOSIS — Z8546 Personal history of malignant neoplasm of prostate: Secondary | ICD-10-CM | POA: Insufficient documentation

## 2018-06-08 DIAGNOSIS — R001 Bradycardia, unspecified: Secondary | ICD-10-CM | POA: Diagnosis not present

## 2018-06-08 LAB — TROPONIN I

## 2018-06-08 LAB — COMPREHENSIVE METABOLIC PANEL
ALBUMIN: 4 g/dL (ref 3.5–5.0)
ALT: 19 U/L (ref 0–44)
ANION GAP: 7 (ref 5–15)
AST: 30 U/L (ref 15–41)
Alkaline Phosphatase: 73 U/L (ref 38–126)
BILIRUBIN TOTAL: 1.3 mg/dL — AB (ref 0.3–1.2)
BUN: 24 mg/dL — AB (ref 8–23)
CHLORIDE: 106 mmol/L (ref 98–111)
CO2: 27 mmol/L (ref 22–32)
Calcium: 9.9 mg/dL (ref 8.9–10.3)
Creatinine, Ser: 1.39 mg/dL — ABNORMAL HIGH (ref 0.61–1.24)
GFR calc Af Amer: 52 mL/min — ABNORMAL LOW (ref >60)
GFR, EST NON AFRICAN AMERICAN: 45 mL/min — AB (ref >60)
GLUCOSE: 105 mg/dL — AB (ref 70–99)
POTASSIUM: 4.7 mmol/L (ref 3.5–5.1)
Sodium: 140 mmol/L (ref 135–145)
TOTAL PROTEIN: 6.6 g/dL (ref 6.5–8.1)

## 2018-06-08 LAB — URINALYSIS, COMPLETE (UACMP) WITH MICROSCOPIC
BILIRUBIN URINE: NEGATIVE
Bacteria, UA: NONE SEEN
GLUCOSE, UA: NEGATIVE mg/dL
HGB URINE DIPSTICK: NEGATIVE
Ketones, ur: NEGATIVE mg/dL
LEUKOCYTES UA: NEGATIVE
NITRITE: NEGATIVE
PROTEIN: NEGATIVE mg/dL
SPECIFIC GRAVITY, URINE: 1.01 (ref 1.005–1.030)
pH: 8 (ref 5.0–8.0)

## 2018-06-08 LAB — CBC WITH DIFFERENTIAL/PLATELET
ABS IMMATURE GRANULOCYTES: 0.73 10*3/uL — AB (ref 0.00–0.07)
BASOS ABS: 0.2 10*3/uL — AB (ref 0.0–0.1)
Basophils Relative: 2 %
Eosinophils Absolute: 0.7 10*3/uL — ABNORMAL HIGH (ref 0.0–0.5)
Eosinophils Relative: 6 %
HCT: 42.7 % (ref 39.0–52.0)
Hemoglobin: 14.1 g/dL (ref 13.0–17.0)
IMMATURE GRANULOCYTES: 6 %
LYMPHS ABS: 1.7 10*3/uL (ref 0.7–4.0)
Lymphocytes Relative: 14 %
MCH: 31.3 pg (ref 26.0–34.0)
MCHC: 33 g/dL (ref 30.0–36.0)
MCV: 94.7 fL (ref 80.0–100.0)
Monocytes Absolute: 0.9 10*3/uL (ref 0.1–1.0)
Monocytes Relative: 8 %
NEUTROS ABS: 7.3 10*3/uL (ref 1.7–7.7)
NEUTROS PCT: 64 %
NRBC: 0 % (ref 0.0–0.2)
PLATELETS: 241 10*3/uL (ref 150–400)
RBC: 4.51 MIL/uL (ref 4.22–5.81)
RDW: 14.3 % (ref 11.5–15.5)
WBC: 11.7 10*3/uL — AB (ref 4.0–10.5)

## 2018-06-08 MED ORDER — ACETAMINOPHEN 500 MG PO TABS: 1000.0000 mg | ORAL_TABLET | Freq: Once | ORAL | Status: DC

## 2018-06-08 MED ORDER — TRAMADOL HCL 50 MG PO TABS
50.0000 mg | ORAL_TABLET | Freq: Once | ORAL | Status: DC
  Filled 2018-06-08: qty 1

## 2018-06-08 MED ORDER — LOSARTAN POTASSIUM 50 MG PO TABS
25.0000 mg | ORAL_TABLET | Freq: Once | ORAL | Status: AC
  Administered 2018-06-08: 25 mg via ORAL
  Filled 2018-06-08: qty 1

## 2018-06-08 NOTE — ED Provider Notes (Signed)
[facility] Emergency Department Provider Note       Time seen: ----------------------------------------- 9:11 AM on 06/08/2018 -----------------------------------------   I have reviewed the triage vital signs and the nursing notes.  HISTORY   Chief Complaint No chief complaint on file.    HPI Fred DAMON Sr. is a 84 y.o. male with a history of kidney stone, prostate cancer, CVA, chronic kidney disease, depression, GERD, hyperlipidemia, hypertension, reported Parkinson's and dementia who presents to the ED for weakness.  Patient reports receiving the shingles vaccine yesterday.  He reports feeling ill upon awakening this morning and having muscle aches, particularly in 1 of his hips.  He denies fevers, chills, chest pain or shortness of breath.  He cannot describe his symptoms more fully.  Past Medical History:  Diagnosis Date  . Arthritis    fingers, neck  . Benign neoplasm of colon, unspecified   . Calculus of kidney   . Cancer of prostate [facility]) 12/21/2013   3/07 resection  . Cancer of skin   . Cerebrovascular accident (CVA) due to embolism of left anterior cerebral artery ([place]) 12/25/2015   Carotid negative, echo negative, right hand weakness, 6/17  . Chronic kidney disease, stage III (moderate) (HCC)   . Depression    unspecified  . Ganglion of tendon sheath   . GERD (gastroesophageal reflux disease)    rare  . History of urethral stricture    postoperative urethral stricture  . Hyperlipidemia, unspecified 12/21/2013  . Hypertension 12/31/2013   Echo s/p fall 11/15  . Kidney stone 12/21/2013  . Lumbar disc disease 12/21/2013  . Malignant neoplasm of prostate ([place])    08/2005 Gleason's  7 (3 4) left base, left mid medial, and left mid lateral. PSA 4.6 with a percent free PSA  . Melanoma ([place])    Stage I, s/p resection 12/2007  . Microscopic hematuria   . Nonspecific finding on examination of urine   . Other hammer toe(s) (acquired),  unspecified foot   . Stress incontinence, male   . Stroke ([place])   . Unilateral inguinal hernia, without obstruction or gangrene, not specified as recurrent    unilateral or unspecified  . Vascular disorder of penis     Patient Active Problem List   Diagnosis Date Noted  . CAP (community acquired pneumonia) 07/23/2017  . Weakness 07/07/2016  . Stroke (cerebrum) ([place]) 12/20/2015  . HTN (hypertension) 12/20/2015  . GERD (gastroesophageal reflux disease) 12/20/2015  . Depression 12/20/2015    Past Surgical History:  Procedure Laterality Date  . CATARACT EXTRACTION W/PHACO Right 11/27/2014   Procedure: CATARACT EXTRACTION PHACO AND INTRAOCULAR LENS PLACEMENT (IOC);  Surgeon: Leandrew Koyanagi, MD;  Location: [facility];  Service: Ophthalmology;  Laterality: Right;  . COLONOSCOPY     with removal of colonic polyps  . EXTRACORPOREAL SHOCK WAVE LITHOTRIPSY Left 05/2012  . EYE SURGERY Left 09/25/14   cataract @[facility]   . HERNIA REPAIR  1999  . HERNIA REPAIR Bilateral 05/2008   Bilateral inguinal hernia repair --- Dr. Tamala Julian  . KIDNEY STONE SURGERY    . MELANOMA EXCISION  12/2007   melanoma resection  . RETROPUBIC PROSTATECTOMY  09/2005   Radical prostatectomy perineal - Dr. Jacqlyn Larsen  . WRIST GANGLION EXCISION Bilateral     Allergies Patient has no known allergies.  Social History Social History   Tobacco Use  . Smoking status: Never Smoker  . Smokeless tobacco: Never Used  Substance Use Topics  . Alcohol use: Yes  Alcohol/week: 4.0 standard drinks    Types: 4 Glasses of wine per week    Comment: occasionally  . Drug use: No   Review of Systems Constitutional: Negative for fever. Cardiovascular: Negative for chest pain. Respiratory: Negative for shortness of breath. Gastrointestinal: Negative for abdominal pain, vomiting and diarrhea. Musculoskeletal: Positive for muscle pain Skin: Negative for rash. Neurological: Negative for headaches, focal weakness or  numbness.  All systems negative/normal/unremarkable except as stated in the HPI  ____________________________________________   PHYSICAL EXAM:  VITAL SIGNS: ED Triage Vitals  Enc Vitals Group     BP      Pulse      Resp      Temp      Temp src      SpO2      Weight      Height      Head Circumference      Peak Flow      Pain Score      Pain Loc      Pain Edu?      Excl. in [place]?    Constitutional: Alert, well appearing and in no distress. Eyes: Conjunctivae are normal. Normal extraocular movements. ENT   Head: Normocephalic and atraumatic.   Nose: No congestion/rhinnorhea.   Mouth/Throat: Mucous membranes are moist.   Neck: No stridor. Cardiovascular: Normal rate, regular rhythm. No murmurs, rubs, or gallops. Respiratory: Normal respiratory effort without tachypnea nor retractions. Breath sounds are clear and equal bilaterally. No wheezes/rales/rhonchi. Gastrointestinal: Soft and nontender. Normal bowel sounds Musculoskeletal: Limited but unremarkable range of motion of the lower extremities Neurologic:  Normal speech and language. No gross focal neurologic deficits are appreciated.  Skin:  Skin is warm, dry and intact. No rash noted. Psychiatric: Mood and affect are normal. Speech and behavior are normal.  ____________________________________________  EKG: Interpreted by me.  Sinus bradycardia with a rate of 49 bpm, PVC, incomplete right bundle branch block, left axis deviation  ____________________________________________  ED COURSE:  As part of my medical decision making, I reviewed the following data within the [place] History obtained from family if available, nursing notes, old chart and ekg, as well as notes from prior ED visits. Patient presented for weakness and general ill feeling, we will assess with labs and imaging as indicated at this time.   Procedures ____________________________________________   LABS (pertinent  positives/negatives)  Labs Reviewed  CBC WITH DIFFERENTIAL/PLATELET - Abnormal; Notable for the following components:      Result Value   WBC 11.7 (*)    Eosinophils Absolute 0.7 (*)    Basophils Absolute 0.2 (*)    Abs Immature Granulocytes 0.73 (*)    All other components within normal limits  COMPREHENSIVE METABOLIC PANEL - Abnormal; Notable for the following components:   Glucose, Bld 105 (*)    BUN 24 (*)    Creatinine, Ser 1.39 (*)    Total Bilirubin 1.3 (*)    GFR calc non Af Amer 45 (*)    GFR calc Af Amer 52 (*)    All other components within normal limits  URINALYSIS, COMPLETE (UACMP) WITH MICROSCOPIC - Abnormal; Notable for the following components:   Color, Urine YELLOW (*)    APPearance CLOUDY (*)    All other components within normal limits  TROPONIN I    RADIOLOGY Images were viewed by me  Chest x-ray, pelvis x-rays  IMPRESSION: Lower lumbar scoliosis and degenerative change. Hip and sacroiliac joints appear symmetric and unremarkable. No fracture  or dislocation. No erosive changes. IMPRESSION: Slight bibasilar atelectasis. No edema or consolidation. Stable cardiac silhouette.  ____________________________________________  DIFFERENTIAL DIAGNOSIS   Dehydration, electrolyte abnormality, occult infection, vaccination side effect  FINAL ASSESSMENT AND PLAN  Weakness, myalgia   Plan: The patient had presented for muscle aches and weakness. Patient's labs were grossly unremarkable, there is minimal leukocytosis without any obvious sign of infection. Patient's imaging was unremarkable.  It is unclear if he is coming down with a viral illness, is simply dealing with dementia or whether he is dealing with side effects from the shingles vaccination.  He is cleared for outpatient follow-up.   Laurence Aly, MD   Note: This note was generated in part or whole with voice recognition software. Voice recognition is usually quite accurate but there  are transcription errors that can and very often do occur. I apologize for any typographical errors that were not detected and corrected.     Earleen Newport, MD 06/08/18 1350

## 2018-06-08 NOTE — ED Notes (Signed)
Vial of life paperwork given back to patient's wife.

## 2018-06-08 NOTE — ED Notes (Signed)
This RN and Anda Kraft, RN attempted to in and out cath patient without success, condom catheter placed by this RN. Pt placed into clean brief, pt also placed on bedpan to attempt to have a BM without success. Pt remains at baseline, denies any complaints. Pt caregiver also stating concerns regarding pt's BP, states patient has not had morning medications. This RN attempted to explain MD aware of BP, and due to not having morning meds, patient's BP could possibly be elevated. Pt's caregiver insists, "it's never that high!" This RN stated understanding, spoke with Dr. Jimmye Norman regarding BP. Will continue to monitor.

## 2018-06-08 NOTE — ED Triage Notes (Signed)
Pt presents to ED via [facility] with c/o "sick". Per EMS pt had shingle shot on Tuesday, woke up today and was awake for approx 1 hr before he began feeling sick. Pt denies fevers, chills, CP, SOB, unable to state how he feels sick, pt with hx of dementia at this time.

## 2018-06-08 NOTE — ED Notes (Signed)
This RN called to bedside at this time. Pt's caregiver and family report leg cramping. Pt's legs placed on pillow at this time.

## 2018-06-08 NOTE — ED Notes (Signed)
Patient straight cathed for urine. Wife requesting home [place]. Dr. Jimmye Norman made aware.

## 2018-06-08 NOTE — ED Notes (Signed)
Patient given meal tray.

## 2018-06-08 NOTE — Progress Notes (Signed)
Chaplain was charting and Pt's daughter asked for assistance. Chaplain offered hositality care. Chaplain asked if Pt needed spiritual care and was affirmed. Chaplain offered pastoral presence and prayer and executed.    06/08/18 1100  Clinical Encounter Type  Visited With Patient and family together  Visit Type Initial  Referral From Family  Spiritual Encounters  Spiritual Needs Prayer

## 2018-06-08 NOTE — ED Notes (Signed)
Patient transported to X-ray 

## 2018-06-13 DIAGNOSIS — B349 Viral infection, unspecified: Secondary | ICD-10-CM | POA: Diagnosis not present

## 2018-06-19 DIAGNOSIS — F0280 Dementia in other diseases classified elsewhere without behavioral disturbance: Secondary | ICD-10-CM | POA: Diagnosis not present

## 2018-06-19 DIAGNOSIS — G20 Parkinson's disease: Secondary | ICD-10-CM | POA: Diagnosis not present

## 2018-06-19 DIAGNOSIS — R296 Repeated falls: Secondary | ICD-10-CM | POA: Diagnosis not present

## 2018-06-19 DIAGNOSIS — R531 Weakness: Secondary | ICD-10-CM | POA: Diagnosis not present

## 2018-06-23 DIAGNOSIS — F329 Major depressive disorder, single episode, unspecified: Secondary | ICD-10-CM | POA: Diagnosis not present

## 2018-06-23 DIAGNOSIS — F0150 Vascular dementia without behavioral disturbance: Secondary | ICD-10-CM | POA: Diagnosis not present

## 2018-06-23 DIAGNOSIS — F0280 Dementia in other diseases classified elsewhere without behavioral disturbance: Secondary | ICD-10-CM | POA: Diagnosis not present

## 2018-06-23 DIAGNOSIS — G20 Parkinson's disease: Secondary | ICD-10-CM | POA: Diagnosis not present

## 2018-06-23 DIAGNOSIS — I63422 Cerebral infarction due to embolism of left anterior cerebral artery: Secondary | ICD-10-CM | POA: Diagnosis not present

## 2018-06-23 DIAGNOSIS — R2689 Other abnormalities of gait and mobility: Secondary | ICD-10-CM | POA: Diagnosis not present

## 2018-06-23 DIAGNOSIS — G4733 Obstructive sleep apnea (adult) (pediatric): Secondary | ICD-10-CM | POA: Diagnosis not present

## 2018-06-23 DIAGNOSIS — G309 Alzheimer's disease, unspecified: Secondary | ICD-10-CM | POA: Diagnosis not present

## 2018-06-23 DIAGNOSIS — Z9989 Dependence on other enabling machines and devices: Secondary | ICD-10-CM | POA: Diagnosis not present

## 2018-07-05 DIAGNOSIS — G20 Parkinson's disease: Secondary | ICD-10-CM | POA: Diagnosis not present

## 2018-08-18 DIAGNOSIS — F0280 Dementia in other diseases classified elsewhere without behavioral disturbance: Secondary | ICD-10-CM | POA: Diagnosis not present

## 2018-08-18 DIAGNOSIS — R499 Unspecified voice and resonance disorder: Secondary | ICD-10-CM | POA: Diagnosis not present

## 2018-08-18 DIAGNOSIS — G20 Parkinson's disease: Secondary | ICD-10-CM | POA: Diagnosis not present

## 2018-08-22 DIAGNOSIS — R499 Unspecified voice and resonance disorder: Secondary | ICD-10-CM | POA: Diagnosis not present

## 2018-08-22 DIAGNOSIS — F0280 Dementia in other diseases classified elsewhere without behavioral disturbance: Secondary | ICD-10-CM | POA: Diagnosis not present

## 2018-08-22 DIAGNOSIS — G20 Parkinson's disease: Secondary | ICD-10-CM | POA: Diagnosis not present

## 2018-08-31 DIAGNOSIS — F0280 Dementia in other diseases classified elsewhere without behavioral disturbance: Secondary | ICD-10-CM | POA: Diagnosis not present

## 2018-08-31 DIAGNOSIS — R499 Unspecified voice and resonance disorder: Secondary | ICD-10-CM | POA: Diagnosis not present

## 2018-08-31 DIAGNOSIS — G20 Parkinson's disease: Secondary | ICD-10-CM | POA: Diagnosis not present

## 2018-09-07 DIAGNOSIS — F0280 Dementia in other diseases classified elsewhere without behavioral disturbance: Secondary | ICD-10-CM | POA: Diagnosis not present

## 2018-09-07 DIAGNOSIS — R499 Unspecified voice and resonance disorder: Secondary | ICD-10-CM | POA: Diagnosis not present

## 2018-09-07 DIAGNOSIS — G20 Parkinson's disease: Secondary | ICD-10-CM | POA: Diagnosis not present

## 2018-09-12 DIAGNOSIS — F0280 Dementia in other diseases classified elsewhere without behavioral disturbance: Secondary | ICD-10-CM | POA: Diagnosis not present

## 2018-09-12 DIAGNOSIS — R499 Unspecified voice and resonance disorder: Secondary | ICD-10-CM | POA: Diagnosis not present

## 2018-09-12 DIAGNOSIS — G20 Parkinson's disease: Secondary | ICD-10-CM | POA: Diagnosis not present

## 2018-09-19 DIAGNOSIS — G20 Parkinson's disease: Secondary | ICD-10-CM | POA: Diagnosis not present

## 2018-09-19 DIAGNOSIS — R499 Unspecified voice and resonance disorder: Secondary | ICD-10-CM | POA: Diagnosis not present

## 2018-09-19 DIAGNOSIS — F0280 Dementia in other diseases classified elsewhere without behavioral disturbance: Secondary | ICD-10-CM | POA: Diagnosis not present

## 2018-09-26 DIAGNOSIS — G20 Parkinson's disease: Secondary | ICD-10-CM | POA: Diagnosis not present

## 2018-09-26 DIAGNOSIS — R499 Unspecified voice and resonance disorder: Secondary | ICD-10-CM | POA: Diagnosis not present

## 2018-09-26 DIAGNOSIS — F0280 Dementia in other diseases classified elsewhere without behavioral disturbance: Secondary | ICD-10-CM | POA: Diagnosis not present

## 2018-09-28 DIAGNOSIS — R399 Unspecified symptoms and signs involving the genitourinary system: Secondary | ICD-10-CM | POA: Diagnosis not present

## 2018-09-28 DIAGNOSIS — A419 Sepsis, unspecified organism: Secondary | ICD-10-CM | POA: Diagnosis not present

## 2018-09-28 DIAGNOSIS — N390 Urinary tract infection, site not specified: Secondary | ICD-10-CM | POA: Diagnosis not present

## 2018-09-28 DIAGNOSIS — G20 Parkinson's disease: Secondary | ICD-10-CM | POA: Diagnosis not present

## 2018-10-03 DIAGNOSIS — G20 Parkinson's disease: Secondary | ICD-10-CM | POA: Diagnosis not present

## 2018-10-03 DIAGNOSIS — R499 Unspecified voice and resonance disorder: Secondary | ICD-10-CM | POA: Diagnosis not present

## 2018-10-03 DIAGNOSIS — F0280 Dementia in other diseases classified elsewhere without behavioral disturbance: Secondary | ICD-10-CM | POA: Diagnosis not present

## 2018-10-05 DIAGNOSIS — G4733 Obstructive sleep apnea (adult) (pediatric): Secondary | ICD-10-CM | POA: Diagnosis not present

## 2018-10-11 DIAGNOSIS — R499 Unspecified voice and resonance disorder: Secondary | ICD-10-CM | POA: Diagnosis not present

## 2018-10-11 DIAGNOSIS — F0280 Dementia in other diseases classified elsewhere without behavioral disturbance: Secondary | ICD-10-CM | POA: Diagnosis not present

## 2018-10-11 DIAGNOSIS — G20 Parkinson's disease: Secondary | ICD-10-CM | POA: Diagnosis not present

## 2018-10-18 DIAGNOSIS — G20 Parkinson's disease: Secondary | ICD-10-CM | POA: Diagnosis not present

## 2018-10-18 DIAGNOSIS — F0280 Dementia in other diseases classified elsewhere without behavioral disturbance: Secondary | ICD-10-CM | POA: Diagnosis not present

## 2018-10-18 DIAGNOSIS — R499 Unspecified voice and resonance disorder: Secondary | ICD-10-CM | POA: Diagnosis not present

## 2018-10-25 DIAGNOSIS — G20 Parkinson's disease: Secondary | ICD-10-CM | POA: Diagnosis not present

## 2018-10-25 DIAGNOSIS — F0280 Dementia in other diseases classified elsewhere without behavioral disturbance: Secondary | ICD-10-CM | POA: Diagnosis not present

## 2018-10-25 DIAGNOSIS — R499 Unspecified voice and resonance disorder: Secondary | ICD-10-CM | POA: Diagnosis not present

## 2018-11-01 DIAGNOSIS — G20 Parkinson's disease: Secondary | ICD-10-CM | POA: Diagnosis not present

## 2018-11-01 DIAGNOSIS — R499 Unspecified voice and resonance disorder: Secondary | ICD-10-CM | POA: Diagnosis not present

## 2018-11-01 DIAGNOSIS — F0280 Dementia in other diseases classified elsewhere without behavioral disturbance: Secondary | ICD-10-CM | POA: Diagnosis not present

## 2018-11-08 DIAGNOSIS — F0280 Dementia in other diseases classified elsewhere without behavioral disturbance: Secondary | ICD-10-CM | POA: Diagnosis not present

## 2018-11-08 DIAGNOSIS — R499 Unspecified voice and resonance disorder: Secondary | ICD-10-CM | POA: Diagnosis not present

## 2018-11-08 DIAGNOSIS — G20 Parkinson's disease: Secondary | ICD-10-CM | POA: Diagnosis not present

## 2018-11-15 DIAGNOSIS — Z9989 Dependence on other enabling machines and devices: Secondary | ICD-10-CM | POA: Diagnosis not present

## 2018-11-15 DIAGNOSIS — Z0000 Encounter for general adult medical examination without abnormal findings: Secondary | ICD-10-CM | POA: Diagnosis not present

## 2018-11-15 DIAGNOSIS — G4733 Obstructive sleep apnea (adult) (pediatric): Secondary | ICD-10-CM | POA: Diagnosis not present

## 2018-11-15 DIAGNOSIS — E538 Deficiency of other specified B group vitamins: Secondary | ICD-10-CM | POA: Diagnosis not present

## 2018-11-15 DIAGNOSIS — E782 Mixed hyperlipidemia: Secondary | ICD-10-CM | POA: Diagnosis not present

## 2018-11-15 DIAGNOSIS — I63422 Cerebral infarction due to embolism of left anterior cerebral artery: Secondary | ICD-10-CM | POA: Diagnosis not present

## 2018-11-15 DIAGNOSIS — G20 Parkinson's disease: Secondary | ICD-10-CM | POA: Diagnosis not present

## 2018-11-17 DIAGNOSIS — R499 Unspecified voice and resonance disorder: Secondary | ICD-10-CM | POA: Diagnosis not present

## 2018-11-17 DIAGNOSIS — G20 Parkinson's disease: Secondary | ICD-10-CM | POA: Diagnosis not present

## 2018-11-17 DIAGNOSIS — F0280 Dementia in other diseases classified elsewhere without behavioral disturbance: Secondary | ICD-10-CM | POA: Diagnosis not present

## 2018-11-22 DIAGNOSIS — R499 Unspecified voice and resonance disorder: Secondary | ICD-10-CM | POA: Diagnosis not present

## 2018-11-22 DIAGNOSIS — F0280 Dementia in other diseases classified elsewhere without behavioral disturbance: Secondary | ICD-10-CM | POA: Diagnosis not present

## 2018-11-22 DIAGNOSIS — G20 Parkinson's disease: Secondary | ICD-10-CM | POA: Diagnosis not present

## 2018-11-29 DIAGNOSIS — R499 Unspecified voice and resonance disorder: Secondary | ICD-10-CM | POA: Diagnosis not present

## 2018-11-29 DIAGNOSIS — F0280 Dementia in other diseases classified elsewhere without behavioral disturbance: Secondary | ICD-10-CM | POA: Diagnosis not present

## 2018-11-29 DIAGNOSIS — G20 Parkinson's disease: Secondary | ICD-10-CM | POA: Diagnosis not present

## 2018-12-04 DIAGNOSIS — R499 Unspecified voice and resonance disorder: Secondary | ICD-10-CM | POA: Diagnosis not present

## 2018-12-04 DIAGNOSIS — F0280 Dementia in other diseases classified elsewhere without behavioral disturbance: Secondary | ICD-10-CM | POA: Diagnosis not present

## 2018-12-04 DIAGNOSIS — G20 Parkinson's disease: Secondary | ICD-10-CM | POA: Diagnosis not present

## 2018-12-07 DIAGNOSIS — G20 Parkinson's disease: Secondary | ICD-10-CM | POA: Diagnosis not present

## 2018-12-18 DIAGNOSIS — Z9181 History of falling: Secondary | ICD-10-CM | POA: Diagnosis not present

## 2018-12-18 DIAGNOSIS — M6281 Muscle weakness (generalized): Secondary | ICD-10-CM | POA: Diagnosis not present

## 2018-12-18 DIAGNOSIS — R2689 Other abnormalities of gait and mobility: Secondary | ICD-10-CM | POA: Diagnosis not present

## 2018-12-22 DIAGNOSIS — R2689 Other abnormalities of gait and mobility: Secondary | ICD-10-CM | POA: Diagnosis not present

## 2018-12-22 DIAGNOSIS — M6281 Muscle weakness (generalized): Secondary | ICD-10-CM | POA: Diagnosis not present

## 2018-12-22 DIAGNOSIS — Z9181 History of falling: Secondary | ICD-10-CM | POA: Diagnosis not present

## 2018-12-25 DIAGNOSIS — M6281 Muscle weakness (generalized): Secondary | ICD-10-CM | POA: Diagnosis not present

## 2018-12-25 DIAGNOSIS — R2689 Other abnormalities of gait and mobility: Secondary | ICD-10-CM | POA: Diagnosis not present

## 2018-12-25 DIAGNOSIS — Z9181 History of falling: Secondary | ICD-10-CM | POA: Diagnosis not present

## 2019-01-01 DIAGNOSIS — Z9181 History of falling: Secondary | ICD-10-CM | POA: Diagnosis not present

## 2019-01-01 DIAGNOSIS — M6281 Muscle weakness (generalized): Secondary | ICD-10-CM | POA: Diagnosis not present

## 2019-01-01 DIAGNOSIS — R2689 Other abnormalities of gait and mobility: Secondary | ICD-10-CM | POA: Diagnosis not present

## 2019-01-03 DIAGNOSIS — F0280 Dementia in other diseases classified elsewhere without behavioral disturbance: Secondary | ICD-10-CM | POA: Diagnosis not present

## 2019-01-03 DIAGNOSIS — G20 Parkinson's disease: Secondary | ICD-10-CM | POA: Diagnosis not present

## 2019-01-03 DIAGNOSIS — R499 Unspecified voice and resonance disorder: Secondary | ICD-10-CM | POA: Diagnosis not present

## 2019-01-08 DIAGNOSIS — R2689 Other abnormalities of gait and mobility: Secondary | ICD-10-CM | POA: Diagnosis not present

## 2019-01-08 DIAGNOSIS — Z9181 History of falling: Secondary | ICD-10-CM | POA: Diagnosis not present

## 2019-01-08 DIAGNOSIS — M6281 Muscle weakness (generalized): Secondary | ICD-10-CM | POA: Diagnosis not present

## 2019-01-10 DIAGNOSIS — G20 Parkinson's disease: Secondary | ICD-10-CM | POA: Diagnosis not present

## 2019-01-10 DIAGNOSIS — R499 Unspecified voice and resonance disorder: Secondary | ICD-10-CM | POA: Diagnosis not present

## 2019-01-10 DIAGNOSIS — F0280 Dementia in other diseases classified elsewhere without behavioral disturbance: Secondary | ICD-10-CM | POA: Diagnosis not present

## 2019-01-11 DIAGNOSIS — G20 Parkinson's disease: Secondary | ICD-10-CM | POA: Diagnosis not present

## 2019-01-11 DIAGNOSIS — G309 Alzheimer's disease, unspecified: Secondary | ICD-10-CM | POA: Diagnosis not present

## 2019-01-11 DIAGNOSIS — R4189 Other symptoms and signs involving cognitive functions and awareness: Secondary | ICD-10-CM | POA: Diagnosis not present

## 2019-01-11 DIAGNOSIS — F0280 Dementia in other diseases classified elsewhere without behavioral disturbance: Secondary | ICD-10-CM | POA: Diagnosis not present

## 2019-01-11 DIAGNOSIS — F0150 Vascular dementia without behavioral disturbance: Secondary | ICD-10-CM | POA: Diagnosis not present

## 2019-01-12 DIAGNOSIS — Z9181 History of falling: Secondary | ICD-10-CM | POA: Diagnosis not present

## 2019-01-12 DIAGNOSIS — R2689 Other abnormalities of gait and mobility: Secondary | ICD-10-CM | POA: Diagnosis not present

## 2019-01-12 DIAGNOSIS — M6281 Muscle weakness (generalized): Secondary | ICD-10-CM | POA: Diagnosis not present

## 2019-01-15 DIAGNOSIS — F0280 Dementia in other diseases classified elsewhere without behavioral disturbance: Secondary | ICD-10-CM | POA: Diagnosis not present

## 2019-01-15 DIAGNOSIS — R499 Unspecified voice and resonance disorder: Secondary | ICD-10-CM | POA: Diagnosis not present

## 2019-01-15 DIAGNOSIS — G20 Parkinson's disease: Secondary | ICD-10-CM | POA: Diagnosis not present

## 2019-01-16 DIAGNOSIS — Z9181 History of falling: Secondary | ICD-10-CM | POA: Diagnosis not present

## 2019-01-16 DIAGNOSIS — R2689 Other abnormalities of gait and mobility: Secondary | ICD-10-CM | POA: Diagnosis not present

## 2019-01-16 DIAGNOSIS — M6281 Muscle weakness (generalized): Secondary | ICD-10-CM | POA: Diagnosis not present

## 2019-01-18 DIAGNOSIS — G4733 Obstructive sleep apnea (adult) (pediatric): Secondary | ICD-10-CM | POA: Diagnosis not present

## 2019-01-22 DIAGNOSIS — M6281 Muscle weakness (generalized): Secondary | ICD-10-CM | POA: Diagnosis not present

## 2019-01-22 DIAGNOSIS — R2689 Other abnormalities of gait and mobility: Secondary | ICD-10-CM | POA: Diagnosis not present

## 2019-01-22 DIAGNOSIS — Z9181 History of falling: Secondary | ICD-10-CM | POA: Diagnosis not present

## 2019-01-24 DIAGNOSIS — R499 Unspecified voice and resonance disorder: Secondary | ICD-10-CM | POA: Diagnosis not present

## 2019-01-24 DIAGNOSIS — F0280 Dementia in other diseases classified elsewhere without behavioral disturbance: Secondary | ICD-10-CM | POA: Diagnosis not present

## 2019-01-24 DIAGNOSIS — G20 Parkinson's disease: Secondary | ICD-10-CM | POA: Diagnosis not present

## 2019-01-30 DIAGNOSIS — R2689 Other abnormalities of gait and mobility: Secondary | ICD-10-CM | POA: Diagnosis not present

## 2019-01-30 DIAGNOSIS — M6281 Muscle weakness (generalized): Secondary | ICD-10-CM | POA: Diagnosis not present

## 2019-01-30 DIAGNOSIS — Z9181 History of falling: Secondary | ICD-10-CM | POA: Diagnosis not present

## 2019-01-31 DIAGNOSIS — G4733 Obstructive sleep apnea (adult) (pediatric): Secondary | ICD-10-CM | POA: Diagnosis not present

## 2019-01-31 DIAGNOSIS — F0280 Dementia in other diseases classified elsewhere without behavioral disturbance: Secondary | ICD-10-CM | POA: Diagnosis not present

## 2019-01-31 DIAGNOSIS — I639 Cerebral infarction, unspecified: Secondary | ICD-10-CM | POA: Diagnosis not present

## 2019-01-31 DIAGNOSIS — R499 Unspecified voice and resonance disorder: Secondary | ICD-10-CM | POA: Diagnosis not present

## 2019-01-31 DIAGNOSIS — G20 Parkinson's disease: Secondary | ICD-10-CM | POA: Diagnosis not present

## 2019-02-05 DIAGNOSIS — M6281 Muscle weakness (generalized): Secondary | ICD-10-CM | POA: Diagnosis not present

## 2019-02-05 DIAGNOSIS — R2689 Other abnormalities of gait and mobility: Secondary | ICD-10-CM | POA: Diagnosis not present

## 2019-02-05 DIAGNOSIS — Z9181 History of falling: Secondary | ICD-10-CM | POA: Diagnosis not present

## 2019-02-08 DIAGNOSIS — R499 Unspecified voice and resonance disorder: Secondary | ICD-10-CM | POA: Diagnosis not present

## 2019-02-08 DIAGNOSIS — G20 Parkinson's disease: Secondary | ICD-10-CM | POA: Diagnosis not present

## 2019-02-08 DIAGNOSIS — F0280 Dementia in other diseases classified elsewhere without behavioral disturbance: Secondary | ICD-10-CM | POA: Diagnosis not present

## 2019-02-14 DIAGNOSIS — R499 Unspecified voice and resonance disorder: Secondary | ICD-10-CM | POA: Diagnosis not present

## 2019-02-14 DIAGNOSIS — F0280 Dementia in other diseases classified elsewhere without behavioral disturbance: Secondary | ICD-10-CM | POA: Diagnosis not present

## 2019-02-14 DIAGNOSIS — G20 Parkinson's disease: Secondary | ICD-10-CM | POA: Diagnosis not present

## 2019-02-15 DIAGNOSIS — Z9181 History of falling: Secondary | ICD-10-CM | POA: Diagnosis not present

## 2019-02-15 DIAGNOSIS — R2689 Other abnormalities of gait and mobility: Secondary | ICD-10-CM | POA: Diagnosis not present

## 2019-02-15 DIAGNOSIS — M6281 Muscle weakness (generalized): Secondary | ICD-10-CM | POA: Diagnosis not present

## 2019-02-22 DIAGNOSIS — R499 Unspecified voice and resonance disorder: Secondary | ICD-10-CM | POA: Diagnosis not present

## 2019-02-22 DIAGNOSIS — F0280 Dementia in other diseases classified elsewhere without behavioral disturbance: Secondary | ICD-10-CM | POA: Diagnosis not present

## 2019-02-22 DIAGNOSIS — G20 Parkinson's disease: Secondary | ICD-10-CM | POA: Diagnosis not present

## 2019-02-23 DIAGNOSIS — M6281 Muscle weakness (generalized): Secondary | ICD-10-CM | POA: Diagnosis not present

## 2019-02-23 DIAGNOSIS — Z9181 History of falling: Secondary | ICD-10-CM | POA: Diagnosis not present

## 2019-02-23 DIAGNOSIS — R2689 Other abnormalities of gait and mobility: Secondary | ICD-10-CM | POA: Diagnosis not present

## 2019-03-03 DIAGNOSIS — G4733 Obstructive sleep apnea (adult) (pediatric): Secondary | ICD-10-CM | POA: Diagnosis not present

## 2019-03-03 DIAGNOSIS — G20 Parkinson's disease: Secondary | ICD-10-CM | POA: Diagnosis not present

## 2019-03-03 DIAGNOSIS — I639 Cerebral infarction, unspecified: Secondary | ICD-10-CM | POA: Diagnosis not present

## 2019-03-16 ENCOUNTER — Other Ambulatory Visit
Admission: RE | Admit: 2019-03-16 | Discharge: 2019-03-16 | Disposition: A | Discharge status: Home / Self Care | Payer: PPO | Source: Ambulatory Visit | Attending: Family Medicine | Admitting: Family Medicine

## 2019-03-16 DIAGNOSIS — M7989 Other specified soft tissue disorders: Secondary | ICD-10-CM | POA: Diagnosis not present

## 2019-03-16 DIAGNOSIS — R5383 Other fatigue: Secondary | ICD-10-CM | POA: Insufficient documentation

## 2019-03-16 DIAGNOSIS — R001 Bradycardia, unspecified: Secondary | ICD-10-CM | POA: Diagnosis not present

## 2019-03-16 DIAGNOSIS — R110 Nausea: Secondary | ICD-10-CM | POA: Diagnosis not present

## 2019-03-16 LAB — TROPONIN I (HIGH SENSITIVITY): Troponin I (High Sensitivity): 6 ng/L (ref <18)

## 2019-03-19 DIAGNOSIS — E782 Mixed hyperlipidemia: Secondary | ICD-10-CM | POA: Diagnosis not present

## 2019-03-19 DIAGNOSIS — G20 Parkinson's disease: Secondary | ICD-10-CM | POA: Diagnosis not present

## 2019-03-19 DIAGNOSIS — H6122 Impacted cerumen, left ear: Secondary | ICD-10-CM | POA: Diagnosis not present

## 2019-03-19 DIAGNOSIS — G4733 Obstructive sleep apnea (adult) (pediatric): Secondary | ICD-10-CM | POA: Diagnosis not present

## 2019-03-19 DIAGNOSIS — Z9989 Dependence on other enabling machines and devices: Secondary | ICD-10-CM | POA: Diagnosis not present

## 2019-03-20 DIAGNOSIS — M6281 Muscle weakness (generalized): Secondary | ICD-10-CM | POA: Diagnosis not present

## 2019-03-20 DIAGNOSIS — R2689 Other abnormalities of gait and mobility: Secondary | ICD-10-CM | POA: Diagnosis not present

## 2019-03-20 DIAGNOSIS — Z9181 History of falling: Secondary | ICD-10-CM | POA: Diagnosis not present

## 2019-03-21 DIAGNOSIS — Z20828 Contact with and (suspected) exposure to other viral communicable diseases: Secondary | ICD-10-CM | POA: Diagnosis not present

## 2019-03-29 DIAGNOSIS — F0280 Dementia in other diseases classified elsewhere without behavioral disturbance: Secondary | ICD-10-CM | POA: Diagnosis not present

## 2019-03-29 DIAGNOSIS — G20 Parkinson's disease: Secondary | ICD-10-CM | POA: Diagnosis not present

## 2019-03-29 DIAGNOSIS — R499 Unspecified voice and resonance disorder: Secondary | ICD-10-CM | POA: Diagnosis not present

## 2019-04-02 DIAGNOSIS — Z9181 History of falling: Secondary | ICD-10-CM | POA: Diagnosis not present

## 2019-04-02 DIAGNOSIS — M6281 Muscle weakness (generalized): Secondary | ICD-10-CM | POA: Diagnosis not present

## 2019-04-02 DIAGNOSIS — R2689 Other abnormalities of gait and mobility: Secondary | ICD-10-CM | POA: Diagnosis not present

## 2019-04-03 DIAGNOSIS — G20 Parkinson's disease: Secondary | ICD-10-CM | POA: Diagnosis not present

## 2019-04-03 DIAGNOSIS — I639 Cerebral infarction, unspecified: Secondary | ICD-10-CM | POA: Diagnosis not present

## 2019-04-03 DIAGNOSIS — G4733 Obstructive sleep apnea (adult) (pediatric): Secondary | ICD-10-CM | POA: Diagnosis not present

## 2019-04-04 DIAGNOSIS — R499 Unspecified voice and resonance disorder: Secondary | ICD-10-CM | POA: Diagnosis not present

## 2019-04-04 DIAGNOSIS — G20 Parkinson's disease: Secondary | ICD-10-CM | POA: Diagnosis not present

## 2019-04-04 DIAGNOSIS — F0280 Dementia in other diseases classified elsewhere without behavioral disturbance: Secondary | ICD-10-CM | POA: Diagnosis not present

## 2019-04-04 DIAGNOSIS — Z1159 Encounter for screening for other viral diseases: Secondary | ICD-10-CM | POA: Diagnosis not present

## 2019-04-04 DIAGNOSIS — Z20828 Contact with and (suspected) exposure to other viral communicable diseases: Secondary | ICD-10-CM | POA: Diagnosis not present

## 2019-04-09 DIAGNOSIS — Z9181 History of falling: Secondary | ICD-10-CM | POA: Diagnosis not present

## 2019-04-09 DIAGNOSIS — R499 Unspecified voice and resonance disorder: Secondary | ICD-10-CM | POA: Diagnosis not present

## 2019-04-09 DIAGNOSIS — G20 Parkinson's disease: Secondary | ICD-10-CM | POA: Diagnosis not present

## 2019-04-09 DIAGNOSIS — F0280 Dementia in other diseases classified elsewhere without behavioral disturbance: Secondary | ICD-10-CM | POA: Diagnosis not present

## 2019-04-09 DIAGNOSIS — R2689 Other abnormalities of gait and mobility: Secondary | ICD-10-CM | POA: Diagnosis not present

## 2019-04-09 DIAGNOSIS — M6281 Muscle weakness (generalized): Secondary | ICD-10-CM | POA: Diagnosis not present

## 2019-04-20 DIAGNOSIS — F0280 Dementia in other diseases classified elsewhere without behavioral disturbance: Secondary | ICD-10-CM | POA: Diagnosis not present

## 2019-04-20 DIAGNOSIS — R499 Unspecified voice and resonance disorder: Secondary | ICD-10-CM | POA: Diagnosis not present

## 2019-04-20 DIAGNOSIS — G20 Parkinson's disease: Secondary | ICD-10-CM | POA: Diagnosis not present

## 2019-04-23 DIAGNOSIS — M6281 Muscle weakness (generalized): Secondary | ICD-10-CM | POA: Diagnosis not present

## 2019-04-23 DIAGNOSIS — R2689 Other abnormalities of gait and mobility: Secondary | ICD-10-CM | POA: Diagnosis not present

## 2019-04-23 DIAGNOSIS — Z9181 History of falling: Secondary | ICD-10-CM | POA: Diagnosis not present

## 2019-04-24 DIAGNOSIS — F0280 Dementia in other diseases classified elsewhere without behavioral disturbance: Secondary | ICD-10-CM | POA: Diagnosis not present

## 2019-04-24 DIAGNOSIS — G20 Parkinson's disease: Secondary | ICD-10-CM | POA: Diagnosis not present

## 2019-04-24 DIAGNOSIS — R499 Unspecified voice and resonance disorder: Secondary | ICD-10-CM | POA: Diagnosis not present

## 2019-04-30 DIAGNOSIS — Z9181 History of falling: Secondary | ICD-10-CM | POA: Diagnosis not present

## 2019-04-30 DIAGNOSIS — R2689 Other abnormalities of gait and mobility: Secondary | ICD-10-CM | POA: Diagnosis not present

## 2019-04-30 DIAGNOSIS — M6281 Muscle weakness (generalized): Secondary | ICD-10-CM | POA: Diagnosis not present

## 2019-05-01 DIAGNOSIS — F0280 Dementia in other diseases classified elsewhere without behavioral disturbance: Secondary | ICD-10-CM | POA: Diagnosis not present

## 2019-05-01 DIAGNOSIS — R499 Unspecified voice and resonance disorder: Secondary | ICD-10-CM | POA: Diagnosis not present

## 2019-05-01 DIAGNOSIS — G20 Parkinson's disease: Secondary | ICD-10-CM | POA: Diagnosis not present

## 2019-05-03 DIAGNOSIS — G20 Parkinson's disease: Secondary | ICD-10-CM | POA: Diagnosis not present

## 2019-05-03 DIAGNOSIS — G4733 Obstructive sleep apnea (adult) (pediatric): Secondary | ICD-10-CM | POA: Diagnosis not present

## 2019-05-03 DIAGNOSIS — I639 Cerebral infarction, unspecified: Secondary | ICD-10-CM | POA: Diagnosis not present

## 2019-05-07 DIAGNOSIS — M6281 Muscle weakness (generalized): Secondary | ICD-10-CM | POA: Diagnosis not present

## 2019-05-07 DIAGNOSIS — R2689 Other abnormalities of gait and mobility: Secondary | ICD-10-CM | POA: Diagnosis not present

## 2019-05-07 DIAGNOSIS — Z9181 History of falling: Secondary | ICD-10-CM | POA: Diagnosis not present

## 2019-05-08 DIAGNOSIS — G20 Parkinson's disease: Secondary | ICD-10-CM | POA: Diagnosis not present

## 2019-05-08 DIAGNOSIS — F0280 Dementia in other diseases classified elsewhere without behavioral disturbance: Secondary | ICD-10-CM | POA: Diagnosis not present

## 2019-05-08 DIAGNOSIS — R499 Unspecified voice and resonance disorder: Secondary | ICD-10-CM | POA: Diagnosis not present

## 2019-05-21 DIAGNOSIS — R2689 Other abnormalities of gait and mobility: Secondary | ICD-10-CM | POA: Diagnosis not present

## 2019-05-21 DIAGNOSIS — M6281 Muscle weakness (generalized): Secondary | ICD-10-CM | POA: Diagnosis not present

## 2019-05-21 DIAGNOSIS — Z9181 History of falling: Secondary | ICD-10-CM | POA: Diagnosis not present

## 2019-05-22 DIAGNOSIS — R499 Unspecified voice and resonance disorder: Secondary | ICD-10-CM | POA: Diagnosis not present

## 2019-05-22 DIAGNOSIS — F0280 Dementia in other diseases classified elsewhere without behavioral disturbance: Secondary | ICD-10-CM | POA: Diagnosis not present

## 2019-05-22 DIAGNOSIS — G20 Parkinson's disease: Secondary | ICD-10-CM | POA: Diagnosis not present

## 2019-05-28 DIAGNOSIS — Z9181 History of falling: Secondary | ICD-10-CM | POA: Diagnosis not present

## 2019-05-28 DIAGNOSIS — M6281 Muscle weakness (generalized): Secondary | ICD-10-CM | POA: Diagnosis not present

## 2019-05-28 DIAGNOSIS — R2689 Other abnormalities of gait and mobility: Secondary | ICD-10-CM | POA: Diagnosis not present

## 2019-05-29 DIAGNOSIS — F0280 Dementia in other diseases classified elsewhere without behavioral disturbance: Secondary | ICD-10-CM | POA: Diagnosis not present

## 2019-05-29 DIAGNOSIS — G20 Parkinson's disease: Secondary | ICD-10-CM | POA: Diagnosis not present

## 2019-05-29 DIAGNOSIS — R499 Unspecified voice and resonance disorder: Secondary | ICD-10-CM | POA: Diagnosis not present

## 2019-05-30 DIAGNOSIS — L720 Epidermal cyst: Secondary | ICD-10-CM | POA: Diagnosis not present

## 2019-05-30 DIAGNOSIS — Z1283 Encounter for screening for malignant neoplasm of skin: Secondary | ICD-10-CM | POA: Diagnosis not present

## 2019-05-30 DIAGNOSIS — L821 Other seborrheic keratosis: Secondary | ICD-10-CM | POA: Diagnosis not present

## 2019-05-30 DIAGNOSIS — L570 Actinic keratosis: Secondary | ICD-10-CM | POA: Diagnosis not present

## 2019-05-30 DIAGNOSIS — L814 Other melanin hyperpigmentation: Secondary | ICD-10-CM | POA: Diagnosis not present

## 2019-05-30 DIAGNOSIS — L820 Inflamed seborrheic keratosis: Secondary | ICD-10-CM | POA: Diagnosis not present

## 2019-05-30 DIAGNOSIS — Z85820 Personal history of malignant melanoma of skin: Secondary | ICD-10-CM | POA: Diagnosis not present

## 2019-05-30 DIAGNOSIS — C44629 Squamous cell carcinoma of skin of left upper limb, including shoulder: Secondary | ICD-10-CM | POA: Diagnosis not present

## 2019-05-30 DIAGNOSIS — D1800 Hemangioma unspecified site: Secondary | ICD-10-CM | POA: Diagnosis not present

## 2019-06-03 DIAGNOSIS — I639 Cerebral infarction, unspecified: Secondary | ICD-10-CM | POA: Diagnosis not present

## 2019-06-03 DIAGNOSIS — G20 Parkinson's disease: Secondary | ICD-10-CM | POA: Diagnosis not present

## 2019-06-03 DIAGNOSIS — G4733 Obstructive sleep apnea (adult) (pediatric): Secondary | ICD-10-CM | POA: Diagnosis not present

## 2019-06-04 DIAGNOSIS — R2689 Other abnormalities of gait and mobility: Secondary | ICD-10-CM | POA: Diagnosis not present

## 2019-06-04 DIAGNOSIS — Z9181 History of falling: Secondary | ICD-10-CM | POA: Diagnosis not present

## 2019-06-04 DIAGNOSIS — M6281 Muscle weakness (generalized): Secondary | ICD-10-CM | POA: Diagnosis not present

## 2019-06-05 DIAGNOSIS — F0280 Dementia in other diseases classified elsewhere without behavioral disturbance: Secondary | ICD-10-CM | POA: Diagnosis not present

## 2019-06-05 DIAGNOSIS — G20 Parkinson's disease: Secondary | ICD-10-CM | POA: Diagnosis not present

## 2019-06-05 DIAGNOSIS — R499 Unspecified voice and resonance disorder: Secondary | ICD-10-CM | POA: Diagnosis not present

## 2019-06-11 DIAGNOSIS — R2689 Other abnormalities of gait and mobility: Secondary | ICD-10-CM | POA: Diagnosis not present

## 2019-06-11 DIAGNOSIS — M6281 Muscle weakness (generalized): Secondary | ICD-10-CM | POA: Diagnosis not present

## 2019-06-11 DIAGNOSIS — Z9181 History of falling: Secondary | ICD-10-CM | POA: Diagnosis not present

## 2019-06-12 DIAGNOSIS — F0280 Dementia in other diseases classified elsewhere without behavioral disturbance: Secondary | ICD-10-CM | POA: Diagnosis not present

## 2019-06-12 DIAGNOSIS — G20 Parkinson's disease: Secondary | ICD-10-CM | POA: Diagnosis not present

## 2019-06-12 DIAGNOSIS — R499 Unspecified voice and resonance disorder: Secondary | ICD-10-CM | POA: Diagnosis not present

## 2019-06-18 DIAGNOSIS — R2689 Other abnormalities of gait and mobility: Secondary | ICD-10-CM | POA: Diagnosis not present

## 2019-06-18 DIAGNOSIS — Z9181 History of falling: Secondary | ICD-10-CM | POA: Diagnosis not present

## 2019-06-18 DIAGNOSIS — M6281 Muscle weakness (generalized): Secondary | ICD-10-CM | POA: Diagnosis not present

## 2019-06-20 DIAGNOSIS — F0280 Dementia in other diseases classified elsewhere without behavioral disturbance: Secondary | ICD-10-CM | POA: Diagnosis not present

## 2019-06-20 DIAGNOSIS — G20 Parkinson's disease: Secondary | ICD-10-CM | POA: Diagnosis not present

## 2019-06-20 DIAGNOSIS — R499 Unspecified voice and resonance disorder: Secondary | ICD-10-CM | POA: Diagnosis not present

## 2019-06-22 DIAGNOSIS — G4733 Obstructive sleep apnea (adult) (pediatric): Secondary | ICD-10-CM | POA: Diagnosis not present

## 2019-06-26 DIAGNOSIS — R499 Unspecified voice and resonance disorder: Secondary | ICD-10-CM | POA: Diagnosis not present

## 2019-06-26 DIAGNOSIS — G20 Parkinson's disease: Secondary | ICD-10-CM | POA: Diagnosis not present

## 2019-06-26 DIAGNOSIS — F0280 Dementia in other diseases classified elsewhere without behavioral disturbance: Secondary | ICD-10-CM | POA: Diagnosis not present

## 2019-07-02 DIAGNOSIS — R2689 Other abnormalities of gait and mobility: Secondary | ICD-10-CM | POA: Diagnosis not present

## 2019-07-02 DIAGNOSIS — Z9181 History of falling: Secondary | ICD-10-CM | POA: Diagnosis not present

## 2019-07-02 DIAGNOSIS — M6281 Muscle weakness (generalized): Secondary | ICD-10-CM | POA: Diagnosis not present

## 2019-07-03 DIAGNOSIS — R499 Unspecified voice and resonance disorder: Secondary | ICD-10-CM | POA: Diagnosis not present

## 2019-07-03 DIAGNOSIS — I639 Cerebral infarction, unspecified: Secondary | ICD-10-CM | POA: Diagnosis not present

## 2019-07-03 DIAGNOSIS — G4733 Obstructive sleep apnea (adult) (pediatric): Secondary | ICD-10-CM | POA: Diagnosis not present

## 2019-07-03 DIAGNOSIS — G20 Parkinson's disease: Secondary | ICD-10-CM | POA: Diagnosis not present

## 2019-07-03 DIAGNOSIS — F0280 Dementia in other diseases classified elsewhere without behavioral disturbance: Secondary | ICD-10-CM | POA: Diagnosis not present

## 2019-07-09 DIAGNOSIS — M6281 Muscle weakness (generalized): Secondary | ICD-10-CM | POA: Diagnosis not present

## 2019-07-09 DIAGNOSIS — Z9181 History of falling: Secondary | ICD-10-CM | POA: Diagnosis not present

## 2019-07-09 DIAGNOSIS — R2689 Other abnormalities of gait and mobility: Secondary | ICD-10-CM | POA: Diagnosis not present

## 2019-07-12 DIAGNOSIS — Z7689 Persons encountering health services in other specified circumstances: Secondary | ICD-10-CM | POA: Diagnosis not present

## 2019-07-23 DIAGNOSIS — Z9181 History of falling: Secondary | ICD-10-CM | POA: Diagnosis not present

## 2019-07-23 DIAGNOSIS — M6281 Muscle weakness (generalized): Secondary | ICD-10-CM | POA: Diagnosis not present

## 2019-07-23 DIAGNOSIS — R2689 Other abnormalities of gait and mobility: Secondary | ICD-10-CM | POA: Diagnosis not present

## 2019-07-24 DIAGNOSIS — G20 Parkinson's disease: Secondary | ICD-10-CM | POA: Diagnosis not present

## 2019-07-24 DIAGNOSIS — F0280 Dementia in other diseases classified elsewhere without behavioral disturbance: Secondary | ICD-10-CM | POA: Diagnosis not present

## 2019-07-24 DIAGNOSIS — R499 Unspecified voice and resonance disorder: Secondary | ICD-10-CM | POA: Diagnosis not present

## 2019-07-30 DIAGNOSIS — M6281 Muscle weakness (generalized): Secondary | ICD-10-CM | POA: Diagnosis not present

## 2019-07-30 DIAGNOSIS — Z9181 History of falling: Secondary | ICD-10-CM | POA: Diagnosis not present

## 2019-07-30 DIAGNOSIS — R2689 Other abnormalities of gait and mobility: Secondary | ICD-10-CM | POA: Diagnosis not present

## 2019-07-31 DIAGNOSIS — R499 Unspecified voice and resonance disorder: Secondary | ICD-10-CM | POA: Diagnosis not present

## 2019-07-31 DIAGNOSIS — F0280 Dementia in other diseases classified elsewhere without behavioral disturbance: Secondary | ICD-10-CM | POA: Diagnosis not present

## 2019-07-31 DIAGNOSIS — G20 Parkinson's disease: Secondary | ICD-10-CM | POA: Diagnosis not present

## 2019-08-03 DIAGNOSIS — I639 Cerebral infarction, unspecified: Secondary | ICD-10-CM | POA: Diagnosis not present

## 2019-08-03 DIAGNOSIS — G20 Parkinson's disease: Secondary | ICD-10-CM | POA: Diagnosis not present

## 2019-08-03 DIAGNOSIS — G4733 Obstructive sleep apnea (adult) (pediatric): Secondary | ICD-10-CM | POA: Diagnosis not present

## 2019-08-06 DIAGNOSIS — M6281 Muscle weakness (generalized): Secondary | ICD-10-CM | POA: Diagnosis not present

## 2019-08-06 DIAGNOSIS — R2689 Other abnormalities of gait and mobility: Secondary | ICD-10-CM | POA: Diagnosis not present

## 2019-08-06 DIAGNOSIS — Z9181 History of falling: Secondary | ICD-10-CM | POA: Diagnosis not present

## 2019-08-10 ENCOUNTER — Other Ambulatory Visit: Payer: Self-pay

## 2019-08-10 ENCOUNTER — Emergency Department: Payer: PPO

## 2019-08-10 ENCOUNTER — Encounter: Payer: Self-pay | Admitting: Emergency Medicine

## 2019-08-10 ENCOUNTER — Inpatient Hospital Stay
Admission: EM | Admit: 2019-08-10 | Discharge: 2019-08-13 | DRG: 066 | Disposition: A | Discharge status: Home Health | Payer: PPO | Attending: Hospitalist | Admitting: Hospitalist

## 2019-08-10 DIAGNOSIS — I10 Essential (primary) hypertension: Secondary | ICD-10-CM | POA: Diagnosis not present

## 2019-08-10 DIAGNOSIS — G20 Parkinson's disease: Secondary | ICD-10-CM | POA: Diagnosis present

## 2019-08-10 DIAGNOSIS — Z87442 Personal history of urinary calculi: Secondary | ICD-10-CM | POA: Diagnosis not present

## 2019-08-10 DIAGNOSIS — R29701 NIHSS score 1: Secondary | ICD-10-CM | POA: Diagnosis present

## 2019-08-10 DIAGNOSIS — Z8042 Family history of malignant neoplasm of prostate: Secondary | ICD-10-CM

## 2019-08-10 DIAGNOSIS — E785 Hyperlipidemia, unspecified: Secondary | ICD-10-CM | POA: Diagnosis not present

## 2019-08-10 DIAGNOSIS — Z20822 Contact with and (suspected) exposure to covid-19: Secondary | ICD-10-CM | POA: Diagnosis not present

## 2019-08-10 DIAGNOSIS — Z7902 Long term (current) use of antithrombotics/antiplatelets: Secondary | ICD-10-CM

## 2019-08-10 DIAGNOSIS — I739 Peripheral vascular disease, unspecified: Secondary | ICD-10-CM | POA: Diagnosis not present

## 2019-08-10 DIAGNOSIS — E039 Hypothyroidism, unspecified: Secondary | ICD-10-CM | POA: Diagnosis not present

## 2019-08-10 DIAGNOSIS — Z79899 Other long term (current) drug therapy: Secondary | ICD-10-CM

## 2019-08-10 DIAGNOSIS — R001 Bradycardia, unspecified: Secondary | ICD-10-CM | POA: Diagnosis not present

## 2019-08-10 DIAGNOSIS — I639 Cerebral infarction, unspecified: Secondary | ICD-10-CM

## 2019-08-10 DIAGNOSIS — F0150 Vascular dementia without behavioral disturbance: Secondary | ICD-10-CM | POA: Diagnosis not present

## 2019-08-10 DIAGNOSIS — G20A1 Parkinson's disease without dyskinesia, without mention of fluctuations: Secondary | ICD-10-CM

## 2019-08-10 DIAGNOSIS — Z961 Presence of intraocular lens: Secondary | ICD-10-CM | POA: Diagnosis not present

## 2019-08-10 DIAGNOSIS — F329 Major depressive disorder, single episode, unspecified: Secondary | ICD-10-CM | POA: Diagnosis not present

## 2019-08-10 DIAGNOSIS — I6523 Occlusion and stenosis of bilateral carotid arteries: Secondary | ICD-10-CM | POA: Diagnosis not present

## 2019-08-10 DIAGNOSIS — Z86010 Personal history of colonic polyps: Secondary | ICD-10-CM

## 2019-08-10 DIAGNOSIS — G8321 Monoplegia of upper limb affecting right dominant side: Secondary | ICD-10-CM | POA: Diagnosis not present

## 2019-08-10 DIAGNOSIS — Z8249 Family history of ischemic heart disease and other diseases of the circulatory system: Secondary | ICD-10-CM

## 2019-08-10 DIAGNOSIS — Z8546 Personal history of malignant neoplasm of prostate: Secondary | ICD-10-CM

## 2019-08-10 DIAGNOSIS — Z85820 Personal history of malignant melanoma of skin: Secondary | ICD-10-CM | POA: Diagnosis not present

## 2019-08-10 DIAGNOSIS — N183 Chronic kidney disease, stage 3 unspecified: Secondary | ICD-10-CM | POA: Diagnosis not present

## 2019-08-10 DIAGNOSIS — Z823 Family history of stroke: Secondary | ICD-10-CM | POA: Diagnosis not present

## 2019-08-10 DIAGNOSIS — M6281 Muscle weakness (generalized): Secondary | ICD-10-CM | POA: Diagnosis not present

## 2019-08-10 DIAGNOSIS — K219 Gastro-esophageal reflux disease without esophagitis: Secondary | ICD-10-CM | POA: Diagnosis not present

## 2019-08-10 DIAGNOSIS — Z9841 Cataract extraction status, right eye: Secondary | ICD-10-CM

## 2019-08-10 DIAGNOSIS — R29898 Other symptoms and signs involving the musculoskeletal system: Secondary | ICD-10-CM

## 2019-08-10 DIAGNOSIS — R531 Weakness: Secondary | ICD-10-CM | POA: Diagnosis not present

## 2019-08-10 DIAGNOSIS — I6389 Other cerebral infarction: Secondary | ICD-10-CM | POA: Diagnosis not present

## 2019-08-10 DIAGNOSIS — I071 Rheumatic tricuspid insufficiency: Secondary | ICD-10-CM | POA: Diagnosis present

## 2019-08-10 DIAGNOSIS — Z8673 Personal history of transient ischemic attack (TIA), and cerebral infarction without residual deficits: Secondary | ICD-10-CM

## 2019-08-10 DIAGNOSIS — I129 Hypertensive chronic kidney disease with stage 1 through stage 4 chronic kidney disease, or unspecified chronic kidney disease: Secondary | ICD-10-CM | POA: Diagnosis not present

## 2019-08-10 DIAGNOSIS — I6522 Occlusion and stenosis of left carotid artery: Secondary | ICD-10-CM | POA: Diagnosis present

## 2019-08-10 DIAGNOSIS — Z9842 Cataract extraction status, left eye: Secondary | ICD-10-CM | POA: Diagnosis not present

## 2019-08-10 HISTORY — DX: Unspecified dementia, unspecified severity, without behavioral disturbance, psychotic disturbance, mood disturbance, and anxiety: F03.90

## 2019-08-10 LAB — BASIC METABOLIC PANEL
Anion gap: 6 (ref 5–15)
BUN: 18 mg/dL (ref 8–23)
CO2: 31 mmol/L (ref 22–32)
Calcium: 9.8 mg/dL (ref 8.9–10.3)
Chloride: 103 mmol/L (ref 98–111)
Creatinine, Ser: 1.41 mg/dL — ABNORMAL HIGH (ref 0.61–1.24)
GFR calc Af Amer: 52 mL/min — ABNORMAL LOW (ref >60)
GFR calc non Af Amer: 45 mL/min — ABNORMAL LOW (ref >60)
Glucose, Bld: 105 mg/dL — ABNORMAL HIGH (ref 70–99)
Potassium: 3.6 mmol/L (ref 3.5–5.1)
Sodium: 140 mmol/L (ref 135–145)

## 2019-08-10 LAB — CBC
HCT: 43.2 % (ref 39.0–52.0)
Hemoglobin: 14.1 g/dL (ref 13.0–17.0)
MCH: 31.3 pg (ref 26.0–34.0)
MCHC: 32.6 g/dL (ref 30.0–36.0)
MCV: 95.8 fL (ref 80.0–100.0)
Platelets: 283 10*3/uL (ref 150–400)
RBC: 4.51 MIL/uL (ref 4.22–5.81)
RDW: 14.4 % (ref 11.5–15.5)
WBC: 10.9 10*3/uL — ABNORMAL HIGH (ref 4.0–10.5)
nRBC: 0 % (ref 0.0–0.2)

## 2019-08-10 MED ORDER — ACETAMINOPHEN 650 MG RE SUPP: 650.0000 mg | RECTAL | Status: DC | PRN

## 2019-08-10 MED ORDER — STROKE: EARLY STAGES OF RECOVERY BOOK: Freq: Once | Status: DC

## 2019-08-10 MED ORDER — DONEPEZIL HCL 5 MG PO TABS
10.0000 mg | ORAL_TABLET | Freq: Every day | ORAL | Status: DC
  Filled 2019-08-10 (×2): qty 2

## 2019-08-10 MED ORDER — ENOXAPARIN SODIUM 40 MG/0.4ML ~~LOC~~ SOLN
40.0000 mg | SUBCUTANEOUS | Status: DC
  Administered 2019-08-11 – 2019-08-13 (×3): 40 mg via SUBCUTANEOUS
  Filled 2019-08-10 (×3): qty 0.4

## 2019-08-10 MED ORDER — CARBIDOPA-LEVODOPA 25-250 MG PO TABS
1.0000 | ORAL_TABLET | Freq: Three times a day (TID) | ORAL | Status: DC
  Administered 2019-08-11 (×2): 1 via ORAL
  Filled 2019-08-10 (×5): qty 1

## 2019-08-10 MED ORDER — ASPIRIN 81 MG PO CHEW
324.0000 mg | CHEWABLE_TABLET | Freq: Once | ORAL | Status: AC
  Administered 2019-08-10: 324 mg via ORAL
  Filled 2019-08-10: qty 4

## 2019-08-10 MED ORDER — ATORVASTATIN CALCIUM 10 MG PO TABS
10.0000 mg | ORAL_TABLET | Freq: Every day | ORAL | Status: DC
  Filled 2019-08-10: qty 1

## 2019-08-10 MED ORDER — DULOXETINE HCL 20 MG PO CPEP
20.0000 mg | ORAL_CAPSULE | Freq: Every day | ORAL | Status: DC
  Administered 2019-08-11 – 2019-08-12 (×2): 20 mg via ORAL
  Filled 2019-08-10 (×6): qty 1

## 2019-08-10 MED ORDER — SENNOSIDES-DOCUSATE SODIUM 8.6-50 MG PO TABS
1.0000 | ORAL_TABLET | Freq: Every evening | ORAL | Status: DC | PRN
  Administered 2019-08-12: 1 via ORAL
  Filled 2019-08-10: qty 1

## 2019-08-10 MED ORDER — ACETAMINOPHEN 325 MG PO TABS: 650.0000 mg | ORAL_TABLET | ORAL | Status: DC | PRN

## 2019-08-10 MED ORDER — SERTRALINE HCL 50 MG PO TABS
50.0000 mg | ORAL_TABLET | Freq: Every day | ORAL | Status: DC
  Administered 2019-08-11 – 2019-08-12 (×2): 50 mg via ORAL
  Filled 2019-08-10 (×3): qty 1

## 2019-08-10 MED ORDER — ACETAMINOPHEN 160 MG/5ML PO SOLN
650.0000 mg | ORAL | Status: DC | PRN
  Filled 2019-08-10: qty 20.3

## 2019-08-10 NOTE — ED Notes (Signed)
Zahra RN, aware of bed assigned

## 2019-08-10 NOTE — ED Triage Notes (Signed)
FIRST NURSE NOTE- here for right side weakness upon waking this morning.  Hx of stroke. LKW 08/09/2019 at 2100.  Wife reports difficulty using right hand. Equal grip strength. No numbness or tingling.

## 2019-08-10 NOTE — ED Triage Notes (Signed)
Pt's wife states that the patient has been weak today, seems to be weaker on the right side according to her and sleeping all day. Was last normal  Was last pm, wife reports that she feels like his right hand is weak today, no arm drift noted, rt hand is weaker than the left, however pt unable to grip completely due to 5th finger contracture.facial movements are equal and symmetric, pt had a stroke in the past 5-7 years

## 2019-08-10 NOTE — ED Notes (Signed)
Pt returned from MRI °

## 2019-08-10 NOTE — ED Provider Notes (Signed)
[facility] Emergency Department Provider Note  ____________________________________________   I have reviewed the triage vital signs and the nursing notes.   HISTORY  Chief Complaint Weakness   History limited by: Not Limited, some history obtained from family   HPI Fred BELSHE Sr. is a 86 y.o. male who presents to the emergency department today because of concern for right arm weakness. The patient has history of CVA and parkinson's. The patient has a home health aide who works with him. Today noticed that he was having a hard time gripping and getting up using the railing using his right hand. Wife states that patient has had stroke in the past which caused some right sided upper extremity weakness but he did undergo rehab. The patient has not had any recent illness. No trauma.    Records reviewed. Per medical record review patient has a history of dementia, CVA.   Past Medical History:  Diagnosis Date  . Arthritis    fingers, neck  . Benign neoplasm of colon, unspecified   . Calculus of kidney   . Cancer of prostate [facility]) 12/21/2013   3/07 resection  . Cancer of skin   . Cerebrovascular accident (CVA) due to embolism of left anterior cerebral artery ([place]) 12/25/2015   Carotid negative, echo negative, right hand weakness, 6/17  . Chronic kidney disease, stage III (moderate)   . Dementia ([place])   . Depression    unspecified  . Ganglion of tendon sheath   . GERD (gastroesophageal reflux disease)    rare  . History of urethral stricture    postoperative urethral stricture  . Hyperlipidemia, unspecified 12/21/2013  . Hypertension 12/31/2013   Echo s/p fall 11/15  . Kidney stone 12/21/2013  . Lumbar disc disease 12/21/2013  . Malignant neoplasm of prostate ([place])    08/2005 Gleason's  7 (3 4) left base, left mid medial, and left mid lateral. PSA 4.6 with a percent free PSA  . Melanoma ([place])    Stage I, s/p resection 12/2007  . Microscopic  hematuria   . Nonspecific finding on examination of urine   . Other hammer toe(s) (acquired), unspecified foot   . Stress incontinence, male   . Stroke ([place])   . Unilateral inguinal hernia, without obstruction or gangrene, not specified as recurrent    unilateral or unspecified  . Vascular disorder of penis     Patient Active Problem List   Diagnosis Date Noted  . CAP (community acquired pneumonia) 07/23/2017  . Weakness 07/07/2016  . Stroke (cerebrum) ([place]) 12/20/2015  . HTN (hypertension) 12/20/2015  . GERD (gastroesophageal reflux disease) 12/20/2015  . Depression 12/20/2015    Past Surgical History:  Procedure Laterality Date  . CATARACT EXTRACTION W/PHACO Right 11/27/2014   Procedure: CATARACT EXTRACTION PHACO AND INTRAOCULAR LENS PLACEMENT (IOC);  Surgeon: Leandrew Koyanagi, MD;  Location: [facility];  Service: Ophthalmology;  Laterality: Right;  . COLONOSCOPY     with removal of colonic polyps  . EXTRACORPOREAL SHOCK WAVE LITHOTRIPSY Left 05/2012  . EYE SURGERY Left 09/25/14   cataract @[facility]   . HERNIA REPAIR  1999  . HERNIA REPAIR Bilateral 05/2008   Bilateral inguinal hernia repair --- Dr. Tamala Julian  . KIDNEY STONE SURGERY    . MELANOMA EXCISION  12/2007   melanoma resection  . RETROPUBIC PROSTATECTOMY  09/2005   Radical prostatectomy perineal - Dr. Jacqlyn Larsen  . WRIST GANGLION EXCISION Bilateral     Prior to Admission medications   Medication Sig Start Date  End Date Taking? Authorizing Provider  Acetaminophen (TYLENOL) 325 MG CAPS Take 325 mg by mouth daily as needed.    [provider]  atorvastatin (LIPITOR) 10 MG tablet Take 10 mg by mouth daily.     [provider]  carbidopa-levodopa (SINEMET IR) 25-250 MG tablet Take 1 tablet by mouth 3 (three) times daily.  06/14/16   [provider]  Cholecalciferol (VITAMIN D3) 2000 units capsule Take 1 capsule by mouth daily.    [provider]  clopidogrel (PLAVIX) 75 MG tablet Take  75 mg by mouth daily.    [provider]  donepezil (ARICEPT) 5 MG tablet Take 5 mg by mouth at bedtime.    [provider]  DULoxetine (CYMBALTA) 30 MG capsule Take 30 mg by mouth daily.  07/05/16   [provider]  levothyroxine (SYNTHROID, LEVOTHROID) 75 MCG tablet Take 75 mcg by mouth daily.  03/02/17   [provider]  losartan (COZAAR) 25 MG tablet Take 25 mg by mouth daily.    [provider]  magnesium oxide (MAG-OX) 400 MG tablet Take 400 mg by mouth.    [provider]  sertraline (ZOLOFT) 50 MG tablet Take 50 mg by mouth daily. 03/21/18   [provider]  vitamin B-12 (CYANOCOBALAMIN) 500 MCG tablet Take 500 mcg by mouth daily.    [provider]    Allergies Patient has no known allergies.  Family History  Problem Relation Age of Onset  . Cancer Other   . Stroke Other   . Heart attack Other   . Stroke Mother   . Cancer Mother   . Heart attack Mother   . Stroke Father   . Cancer Father   . Cancer Sister   . Prostate cancer Brother   . GU problems Neg Hx   . Kidney disease Neg Hx     Social History Social History   Tobacco Use  . Smoking status: Never Smoker  . Smokeless tobacco: Never Used  Substance Use Topics  . Alcohol use: Yes    Alcohol/week: 4.0 standard drinks    Types: 4 Glasses of wine per week    Comment: occasionally  . Drug use: No    Review of Systems Constitutional: No fever/chills Eyes: No visual changes. ENT: No sore throat. Cardiovascular: Denies chest pain. Respiratory: Denies shortness of breath. Gastrointestinal: No abdominal pain.  No nausea, no vomiting.  No diarrhea.   Genitourinary: Negative for dysuria. Musculoskeletal: Negative for back pain. Skin: Negative for rash. Neurological: Positive for right arm weakness. ____________________________________________   PHYSICAL EXAM:  VITAL SIGNS: ED Triage Vitals  Enc Vitals Group     BP 08/10/19 1530 139/73      Pulse Rate 08/10/19 1530 (!) 112     Resp 08/10/19 1530 16     Temp 08/10/19 1530 97.7 F (36.5 C)     Temp Source 08/10/19 1530 Oral     SpO2 08/10/19 1530 100 %     Weight 08/10/19 1531 164 lb (74.4 kg)     Height 08/10/19 1531 5\' 7"  (1.702 m)     Head Circumference --      Peak Flow --      Pain Score 08/10/19 1531 0   Constitutional: Awake and alert. Not completely oriented.  Eyes: Conjunctivae are normal.  ENT      Head: Normocephalic and atraumatic.      Nose: No congestion/rhinnorhea.      Mouth/Throat: Mucous membranes are moist.  Neck: No stridor. Hematological/Lymphatic/Immunilogical: No cervical lymphadenopathy. Cardiovascular: Normal rate, regular rhythm.  No murmurs, rubs, or gallops. Respiratory: Normal respiratory effort without tachypnea nor retractions. Breath sounds are clear and equal bilaterally. No wheezes/rales/rhonchi. Gastrointestinal: Soft and non tender. No rebound. No guarding.  Genitourinary: Deferred Musculoskeletal: Normal range of motion in all extremities. No lower extremity edema. Neurologic:  Normal speech and language. PERRL. No pronator drift. Grip strength 5/5 in bilateral upper extremities. Strength 4+/5 in bilateral lower extremities. sensatation intact.  Skin:  Skin is warm, dry and intact. No rash noted. Psychiatric: Mood and affect are normal. Speech and behavior are normal. Patient exhibits appropriate insight and judgment.  ____________________________________________    LABS (pertinent positives/negatives)  CBC wbc 10.9, hgb 14.1, plt 283 BMP wnl except glu 105, cr 1.41  ____________________________________________   EKG  I, Nance Pear, attending physician, personally viewed and interpreted this EKG  EKG Time: 1542 Rate: 53 Rhythm: sinus bradycardia  Axis: left axis deviation Intervals: qtc 437, 1st degree av block QRS: LAFB ST changes: no st elevation Impression: abnormal  ekg  ____________________________________________    RADIOLOGY  CT head No acute abnormality  MRI Concern for acute strokes in left parietal and left frontal lobe ____________________________________________   PROCEDURES  Procedures  ____________________________________________   INITIAL IMPRESSION / ASSESSMENT AND PLAN / ED COURSE  Pertinent labs & imaging results that were available during my care of the patient were reviewed by me and considered in my medical decision making (see chart for details).   Presented to the emergency department today because of concerns for right hand weakness.  This was noticed this morning.  Patient has a history of distant CVAs.  CT head was negative thus MRI was obtained.  MRI was consistent with acute stroke in the left parietal and frontal lobe.  Discussed this finding with patient and family.  Will plan on admission.  ____________________________________________   FINAL CLINICAL IMPRESSION(S) / ED DIAGNOSES  Final diagnoses:  Right arm weakness  Cerebrovascular accident (CVA), unspecified mechanism ([place])     Note: This dictation was prepared with Dragon dictation. Any transcriptional errors that result from this process are unintentional     Nance Pear, MD 08/10/19 [phone]

## 2019-08-10 NOTE — ED Notes (Signed)
Pt in MRI.

## 2019-08-10 NOTE — ED Notes (Signed)
Pt and family requested food. Provider notified. Provider requested dysphagia screen to be performed;  Pt passed dysphagia screen; given meal to eat

## 2019-08-11 ENCOUNTER — Observation Stay
Admit: 2019-08-11 | Discharge: 2019-08-11 | Disposition: A | Discharge status: Home / Self Care | Payer: PPO | Attending: Internal Medicine | Admitting: Internal Medicine

## 2019-08-11 ENCOUNTER — Observation Stay: Payer: PPO

## 2019-08-11 DIAGNOSIS — K219 Gastro-esophageal reflux disease without esophagitis: Secondary | ICD-10-CM | POA: Diagnosis present

## 2019-08-11 DIAGNOSIS — I6522 Occlusion and stenosis of left carotid artery: Secondary | ICD-10-CM | POA: Diagnosis present

## 2019-08-11 DIAGNOSIS — F329 Major depressive disorder, single episode, unspecified: Secondary | ICD-10-CM | POA: Diagnosis present

## 2019-08-11 DIAGNOSIS — Z85820 Personal history of malignant melanoma of skin: Secondary | ICD-10-CM | POA: Diagnosis not present

## 2019-08-11 DIAGNOSIS — E785 Hyperlipidemia, unspecified: Secondary | ICD-10-CM | POA: Diagnosis present

## 2019-08-11 DIAGNOSIS — I10 Essential (primary) hypertension: Secondary | ICD-10-CM | POA: Diagnosis not present

## 2019-08-11 DIAGNOSIS — I6389 Other cerebral infarction: Secondary | ICD-10-CM | POA: Diagnosis present

## 2019-08-11 DIAGNOSIS — I129 Hypertensive chronic kidney disease with stage 1 through stage 4 chronic kidney disease, or unspecified chronic kidney disease: Secondary | ICD-10-CM | POA: Diagnosis present

## 2019-08-11 DIAGNOSIS — G20 Parkinson's disease: Secondary | ICD-10-CM | POA: Diagnosis present

## 2019-08-11 DIAGNOSIS — F0150 Vascular dementia without behavioral disturbance: Secondary | ICD-10-CM | POA: Diagnosis present

## 2019-08-11 DIAGNOSIS — Z20822 Contact with and (suspected) exposure to covid-19: Secondary | ICD-10-CM | POA: Diagnosis present

## 2019-08-11 DIAGNOSIS — Z86010 Personal history of colonic polyps: Secondary | ICD-10-CM | POA: Diagnosis not present

## 2019-08-11 DIAGNOSIS — Z8673 Personal history of transient ischemic attack (TIA), and cerebral infarction without residual deficits: Secondary | ICD-10-CM | POA: Diagnosis not present

## 2019-08-11 DIAGNOSIS — N183 Chronic kidney disease, stage 3 unspecified: Secondary | ICD-10-CM | POA: Diagnosis present

## 2019-08-11 DIAGNOSIS — Z8546 Personal history of malignant neoplasm of prostate: Secondary | ICD-10-CM | POA: Diagnosis not present

## 2019-08-11 DIAGNOSIS — R29701 NIHSS score 1: Secondary | ICD-10-CM | POA: Diagnosis present

## 2019-08-11 DIAGNOSIS — E039 Hypothyroidism, unspecified: Secondary | ICD-10-CM | POA: Diagnosis present

## 2019-08-11 DIAGNOSIS — I639 Cerebral infarction, unspecified: Secondary | ICD-10-CM | POA: Diagnosis present

## 2019-08-11 DIAGNOSIS — Z8249 Family history of ischemic heart disease and other diseases of the circulatory system: Secondary | ICD-10-CM | POA: Diagnosis not present

## 2019-08-11 DIAGNOSIS — G8321 Monoplegia of upper limb affecting right dominant side: Secondary | ICD-10-CM | POA: Diagnosis present

## 2019-08-11 DIAGNOSIS — Z9842 Cataract extraction status, left eye: Secondary | ICD-10-CM | POA: Diagnosis not present

## 2019-08-11 DIAGNOSIS — I739 Peripheral vascular disease, unspecified: Secondary | ICD-10-CM | POA: Diagnosis present

## 2019-08-11 DIAGNOSIS — I6523 Occlusion and stenosis of bilateral carotid arteries: Secondary | ICD-10-CM | POA: Diagnosis not present

## 2019-08-11 DIAGNOSIS — Z9841 Cataract extraction status, right eye: Secondary | ICD-10-CM | POA: Diagnosis not present

## 2019-08-11 DIAGNOSIS — Z87442 Personal history of urinary calculi: Secondary | ICD-10-CM | POA: Diagnosis not present

## 2019-08-11 DIAGNOSIS — Z961 Presence of intraocular lens: Secondary | ICD-10-CM | POA: Diagnosis present

## 2019-08-11 DIAGNOSIS — Z823 Family history of stroke: Secondary | ICD-10-CM | POA: Diagnosis not present

## 2019-08-11 LAB — BASIC METABOLIC PANEL
Anion gap: 6 (ref 5–15)
BUN: 20 mg/dL (ref 8–23)
CO2: 29 mmol/L (ref 22–32)
Calcium: 9.7 mg/dL (ref 8.9–10.3)
Chloride: 105 mmol/L (ref 98–111)
Creatinine, Ser: 1.42 mg/dL — ABNORMAL HIGH (ref 0.61–1.24)
GFR calc Af Amer: 51 mL/min — ABNORMAL LOW (ref >60)
GFR calc non Af Amer: 44 mL/min — ABNORMAL LOW (ref >60)
Glucose, Bld: 105 mg/dL — ABNORMAL HIGH (ref 70–99)
Potassium: 4 mmol/L (ref 3.5–5.1)
Sodium: 140 mmol/L (ref 135–145)

## 2019-08-11 LAB — SARS CORONAVIRUS 2 (TAT 6-24 HRS): SARS Coronavirus 2: NEGATIVE

## 2019-08-11 LAB — LIPID PANEL
Cholesterol: 140 mg/dL (ref 0–200)
HDL: 27 mg/dL — ABNORMAL LOW (ref >40)
LDL Cholesterol: 85 mg/dL (ref 0–99)
Total CHOL/HDL Ratio: 5.2 RATIO
Triglycerides: 138 mg/dL (ref <150)
VLDL: 28 mg/dL (ref 0–40)

## 2019-08-11 LAB — CBC
HCT: 39.2 % (ref 39.0–52.0)
Hemoglobin: 12.9 g/dL — ABNORMAL LOW (ref 13.0–17.0)
MCH: 31.9 pg (ref 26.0–34.0)
MCHC: 32.9 g/dL (ref 30.0–36.0)
MCV: 97 fL (ref 80.0–100.0)
Platelets: 261 10*3/uL (ref 150–400)
RBC: 4.04 MIL/uL — ABNORMAL LOW (ref 4.22–5.81)
RDW: 14.5 % (ref 11.5–15.5)
WBC: 11 10*3/uL — ABNORMAL HIGH (ref 4.0–10.5)
nRBC: 0 % (ref 0.0–0.2)

## 2019-08-11 LAB — MAGNESIUM: Magnesium: 2.2 mg/dL (ref 1.7–2.4)

## 2019-08-11 LAB — HEMOGLOBIN A1C
Hgb A1c MFr Bld: 5.7 % — ABNORMAL HIGH (ref 4.8–5.6)
Mean Plasma Glucose: 116.89 mg/dL

## 2019-08-11 MED ORDER — CARBIDOPA-LEVODOPA 25-250 MG PO TABS
1.0000 | ORAL_TABLET | Freq: Three times a day (TID) | ORAL | Status: DC
  Administered 2019-08-12 – 2019-08-13 (×4): 1 via ORAL
  Filled 2019-08-11 (×6): qty 1

## 2019-08-11 MED ORDER — DONEPEZIL HCL 5 MG PO TABS
5.0000 mg | ORAL_TABLET | Freq: Every day | ORAL | Status: DC
  Administered 2019-08-11 – 2019-08-12 (×2): 5 mg via ORAL
  Filled 2019-08-11 (×4): qty 1

## 2019-08-11 MED ORDER — PANTOPRAZOLE SODIUM 40 MG PO TBEC
40.0000 mg | DELAYED_RELEASE_TABLET | Freq: Every day | ORAL | Status: DC
  Administered 2019-08-11 – 2019-08-13 (×3): 40 mg via ORAL
  Filled 2019-08-11 (×3): qty 1

## 2019-08-11 MED ORDER — ASPIRIN EC 81 MG PO TBEC
81.0000 mg | DELAYED_RELEASE_TABLET | Freq: Every day | ORAL | Status: DC
  Administered 2019-08-11 – 2019-08-12 (×2): 81 mg via ORAL
  Filled 2019-08-11 (×2): qty 1

## 2019-08-11 MED ORDER — CLOPIDOGREL BISULFATE 75 MG PO TABS
75.0000 mg | ORAL_TABLET | Freq: Every day | ORAL | Status: DC
  Administered 2019-08-11 – 2019-08-13 (×3): 75 mg via ORAL
  Filled 2019-08-11 (×3): qty 1

## 2019-08-11 MED ORDER — SERTRALINE HCL 50 MG PO TABS: 50.0000 mg | ORAL_TABLET | Freq: Every day | ORAL | Status: DC

## 2019-08-11 MED ORDER — CARBIDOPA-LEVODOPA ER 50-200 MG PO TBCR
1.0000 | EXTENDED_RELEASE_TABLET | Freq: Every day | ORAL | Status: DC
  Administered 2019-08-11 – 2019-08-12 (×2): 1 via ORAL
  Filled 2019-08-11 (×4): qty 1

## 2019-08-11 MED ORDER — LEVOTHYROXINE SODIUM 50 MCG PO TABS
75.0000 ug | ORAL_TABLET | Freq: Every day | ORAL | Status: DC
  Administered 2019-08-12 – 2019-08-13 (×2): 75 ug via ORAL
  Filled 2019-08-11 (×2): qty 1

## 2019-08-11 MED ORDER — LOSARTAN POTASSIUM 25 MG PO TABS
25.0000 mg | ORAL_TABLET | Freq: Every day | ORAL | Status: DC
  Administered 2019-08-11 – 2019-08-13 (×3): 25 mg via ORAL
  Filled 2019-08-11 (×3): qty 1

## 2019-08-11 MED ORDER — ATORVASTATIN CALCIUM 20 MG PO TABS
20.0000 mg | ORAL_TABLET | Freq: Every day | ORAL | Status: DC
  Administered 2019-08-11 – 2019-08-12 (×2): 20 mg via ORAL
  Filled 2019-08-11 (×2): qty 1

## 2019-08-11 NOTE — H&P (Signed)
History and Physical    Fred Jones [id number] DOB: 1931/02/28 DOA: 08/10/2019  PCP: Rusty Aus, MD   Patient coming from: Home, where he lives with his wife with 24-hour care  I have personally briefly reviewed patient's old medical records in [place]  Chief Complaint: Right arm weakness  HPI: Fred WILKINSON Sr. is a 86 y.o. male with medical history significant for Parkinson's disease, remote history of CVA without residual deficit and on Plavix, CKD 3, hypothyroidism, hypertension and mild dementia who was brought to the emergency room because of a concern for right arm weakness that was noticed by his caregiver and his wife during the morning.  Presented several hours following the admission of the symptoms.  Of the history is taken from the daughter at the bedside who said he was in his usual state of health until that time.  He had no complaints of visual disturbance, numbness or tingling, no chest pain, shortness of breath, fever or chills.  No GI or GU symptoms  ED Course: On arrival in the emergency room he was afebrile with blood pressure 135/92 and otherwise normal vitals.  Lab work was unremarkable with creatinine at 1.4 which is his baseline. MRI showed ... Punctate acute/early subacute infarct within the left parietal cortex versus image noise artifact. Additionally, there is suspicion for a 2 mm subacute infarct within the left frontal lobe white matter. Redemonstrated chronic cortical infarcts within the left frontoparietal lobes and chronic lacunar infarct within the left caudate head...  Review of Systems: As per HPI otherwise 10 point review of systems negative.    Past Medical History:  Diagnosis Date  . Arthritis    fingers, neck  . Benign neoplasm of colon, unspecified   . Calculus of kidney   . Cancer of prostate [facility]) 12/21/2013   3/07 resection  . Cancer of skin   . Cerebrovascular accident (CVA) due to embolism of left anterior  cerebral artery ([place]) 12/25/2015   Carotid negative, echo negative, right hand weakness, 6/17  . Chronic kidney disease, stage III (moderate)   . Dementia ([place])   . Depression    unspecified  . Ganglion of tendon sheath   . GERD (gastroesophageal reflux disease)    rare  . History of urethral stricture    postoperative urethral stricture  . Hyperlipidemia, unspecified 12/21/2013  . Hypertension 12/31/2013   Echo s/p fall 11/15  . Kidney stone 12/21/2013  . Lumbar disc disease 12/21/2013  . Malignant neoplasm of prostate ([place])    08/2005 Gleason's  7 (3 4) left base, left mid medial, and left mid lateral. PSA 4.6 with a percent free PSA  . Melanoma ([place])    Stage I, s/p resection 12/2007  . Microscopic hematuria   . Nonspecific finding on examination of urine   . Other hammer toe(s) (acquired), unspecified foot   . Stress incontinence, male   . Stroke ([place])   . Unilateral inguinal hernia, without obstruction or gangrene, not specified as recurrent    unilateral or unspecified  . Vascular disorder of penis     Past Surgical History:  Procedure Laterality Date  . CATARACT EXTRACTION W/PHACO Right 11/27/2014   Procedure: CATARACT EXTRACTION PHACO AND INTRAOCULAR LENS PLACEMENT (IOC);  Surgeon: Leandrew Koyanagi, MD;  Location: [place] [facility];  Service: Ophthalmology;  Laterality: Right;  . COLONOSCOPY     with removal of colonic polyps  . EXTRACORPOREAL SHOCK WAVE LITHOTRIPSY Left 05/2012  . EYE SURGERY Left  09/25/14   cataract @[facility]   . HERNIA REPAIR  1999  . HERNIA REPAIR Bilateral 05/2008   Bilateral inguinal hernia repair --- Dr. Tamala Julian  . KIDNEY STONE SURGERY    . MELANOMA EXCISION  12/2007   melanoma resection  . RETROPUBIC PROSTATECTOMY  09/2005   Radical prostatectomy perineal - Dr. Jacqlyn Larsen  . WRIST GANGLION EXCISION Bilateral      reports that he has never smoked. He has never used smokeless tobacco. He reports current alcohol use of about 4.0 standard drinks of  alcohol per week. He reports that he does not use drugs.  No Known Allergies  Family History  Problem Relation Age of Onset  . Cancer Other   . Stroke Other   . Heart attack Other   . Stroke Mother   . Cancer Mother   . Heart attack Mother   . Stroke Father   . Cancer Father   . Cancer Sister   . Prostate cancer Brother   . GU problems Neg Hx   . Kidney disease Neg Hx      Prior to Admission medications   Medication Sig Start Date End Date Taking? Authorizing Provider  Acetaminophen (TYLENOL) 325 MG CAPS Take 325 mg by mouth daily as needed.   Yes [provider]  atorvastatin (LIPITOR) 10 MG tablet Take 10 mg by mouth daily.    Yes [provider]  carbidopa-levodopa (SINEMET CR) 50-200 MG tablet Take 1 tablet by mouth at bedtime. 07/12/19  Yes [provider]  carbidopa-levodopa (SINEMET IR) 25-250 MG tablet Take 1 tablet by mouth 3 (three) times daily.  06/14/16  Yes [provider]  Cholecalciferol (VITAMIN D3) 2000 units capsule Take 1 capsule by mouth daily.   Yes [provider]  clopidogrel (PLAVIX) 75 MG tablet Take 75 mg by mouth daily.   Yes [provider]  donepezil (ARICEPT) 5 MG tablet Take 5 mg by mouth at bedtime.   Yes [provider]  DULoxetine (CYMBALTA) 30 MG capsule Take 30 mg by mouth daily.  07/05/16  Yes [provider]  levothyroxine (SYNTHROID, LEVOTHROID) 75 MCG tablet Take 75 mcg by mouth daily.  03/02/17  Yes [provider]  magnesium oxide (MAG-OX) 400 MG tablet Take 400 mg by mouth.   Yes [provider]  Multiple Vitamin (MULTI-VITAMIN) tablet Take 1 tablet by mouth daily.   Yes [provider]  omeprazole (PRILOSEC) 40 MG capsule Take 40 mg by mouth daily. 06/11/19  Yes [provider]  sertraline (ZOLOFT) 50 MG tablet Take 50 mg by mouth daily. 03/21/18  Yes [provider]  vitamin B-12 (CYANOCOBALAMIN) 500 MCG tablet Take 500 mcg  by mouth daily.   Yes [provider]  losartan (COZAAR) 25 MG tablet Take 25 mg by mouth daily.    [provider]    Physical Exam: Vitals:   08/10/19 2245 08/10/19 2300 08/10/19 2353 08/11/19 0042  BP:  (!) 142/114 (!) 154/74 138/81  Pulse: (!) 49 (!) 48 73 (!) 51  Resp:    20  Temp:    98.3 F (36.8 C)  TempSrc:    Oral  SpO2: 97% 97% 98% 100%  Weight:    72.6 kg  Height:         Vitals:   08/10/19 2245 08/10/19 2300 08/10/19 2353 08/11/19 0042  BP:  (!) 142/114 (!) 154/74 138/81  Pulse: (!) 49 (!) 48 73 (!) 51  Resp:    20  Temp:    98.3 F (36.8 C)  TempSrc:    Oral  SpO2: 97% 97% 98% 100%  Weight:    72.6 kg  Height:        Constitutional: NAD, alert and oriented x 2 Eyes: PERRL, lids and conjunctivae normal ENMT: Mucous membranes are moist.  Neck: normal, supple, no masses, no thyromegaly Respiratory: clear to auscultation bilaterally, no wheezing, no crackles. Normal respiratory effort. No accessory muscle use.  Cardiovascular: Regular rate and rhythm, no murmurs / rubs / gallops. No extremity edema. 2+ pedal pulses. No carotid bruits.  Abdomen: no tenderness, no masses palpated. No hepatosplenomegaly. Bowel sounds positive.  Musculoskeletal: no clubbing / cyanosis. No joint deformity upper and lower extremities.  Skin: no rashes, lesions, ulcers.  Neurologic: No gross focal neurologic deficit.CN II-XII grossly intact. Weakness of extremities not appreciated Psychiatric: Normal mood and affect.   Labs on Admission: I have personally reviewed following labs and imaging studies  CBC: Recent Labs  Lab 08/10/19 1534  WBC 10.9*  HGB 14.1  HCT 43.2  MCV 95.8  PLT [id number]   Basic Metabolic Panel: Recent Labs  Lab 08/10/19 1534  NA 140  K 3.6  CL 103  CO2 31  GLUCOSE 105*  BUN 18  CREATININE 1.41*  CALCIUM 9.8   GFR: Estimated Creatinine Clearance: 35.2 mL/min (A) (by C-G formula based on SCr of 1.41 mg/dL (H)). Liver Function  Tests: No results for input(s): AST, ALT, ALKPHOS, BILITOT, PROT, ALBUMIN in the last 168 hours. No results for input(s): LIPASE, AMYLASE in the last 168 hours. No results for input(s): AMMONIA in the last 168 hours. Coagulation Profile: No results for input(s): INR, PROTIME in the last 168 hours. Cardiac Enzymes: No results for input(s): CKTOTAL, CKMB, CKMBINDEX, TROPONINI in the last 168 hours. BNP (last 3 results) No results for input(s): PROBNP in the last 8760 hours. HbA1C: No results for input(s): HGBA1C in the last 72 hours. CBG: No results for input(s): GLUCAP in the last 168 hours. Lipid Profile: No results for input(s): CHOL, HDL, LDLCALC, TRIG, CHOLHDL, LDLDIRECT in the last 72 hours. Thyroid Function Tests: No results for input(s): TSH, T4TOTAL, FREET4, T3FREE, THYROIDAB in the last 72 hours. Anemia Panel: No results for input(s): VITAMINB12, FOLATE, FERRITIN, TIBC, IRON, RETICCTPCT in the last 72 hours. Urine analysis:    Component Value Date/Time   COLORURINE YELLOW (A) 06/08/2018 0915   APPEARANCEUR CLOUDY (A) 06/08/2018 0915   APPEARANCEUR Clear 06/12/2014 2255   LABSPEC 1.010 06/08/2018 0915   LABSPEC 1.005 06/12/2014 2255   PHURINE 8.0 06/08/2018 0915   GLUCOSEU NEGATIVE 06/08/2018 0915   GLUCOSEU Negative 06/12/2014 2255   HGBUR NEGATIVE 06/08/2018 0915   BILIRUBINUR NEGATIVE 06/08/2018 0915   BILIRUBINUR Negative 06/12/2014 2255   KETONESUR NEGATIVE 06/08/2018 0915   PROTEINUR NEGATIVE 06/08/2018 0915   NITRITE NEGATIVE 06/08/2018 0915   LEUKOCYTESUR NEGATIVE 06/08/2018 0915   LEUKOCYTESUR Negative 06/12/2014 2255    Radiological Exams on Admission: CT HEAD WO CONTRAST  Result Date: 08/10/2019 CLINICAL DATA:  FIRST NURSE NOTE- here for right side weakness upon waking this morning. Hx of stroke. LKW 08/09/2019 at 2100. Wife reports difficulty using right hand. Equal grip strength. No numbness or tingling EXAM: CT HEAD WITHOUT CONTRAST TECHNIQUE:  Contiguous axial images were obtained from the base of the skull through the vertex without intravenous contrast. COMPARISON:  04/15/2018 FINDINGS: Brain: No evidence of acute infarction, hemorrhage, hydrocephalus, extra-axial collection or mass lesion/mass effect. There is ventricular greater than sulcal  enlargement, reflecting atrophy, unchanged from the prior head CT. Mild periventricular white matter hypoattenuation is also noted consistent with chronic microvascular ischemic change, also stable. Vascular: No hyperdense vessel or unexpected calcification. Skull: Normal. Negative for fracture or focal lesion. Sinuses/Orbits: Visualized globes and orbits are unremarkable. Visualized sinuses are clear. Other: None. IMPRESSION: 1. No acute intracranial abnormalities. 2. Atrophy and mild chronic microvascular ischemic change stable from the prior head CT. Electronically Signed   By: Lajean Manes M.D.   On: 08/10/2019 16:00   MR BRAIN WO CONTRAST  Result Date: 08/10/2019 CLINICAL DATA:  Right arm weakness. Additional history provided: Patient's wife reports he has been weak today, seems to be weaker on the right side. EXAM: MRI HEAD WITHOUT CONTRAST TECHNIQUE: Multiplanar, multiecho pulse sequences of the brain and surrounding structures were obtained without intravenous contrast. COMPARISON:  Head CT 08/10/2019, MRI/MRA head 12/21/2015 FINDINGS: Brain: The examination is motion degraded, limiting evaluation. Most notably, there is moderate motion degradation of the axial T2 weighted sequence, moderate motion degradation of the axial SWI sequence, moderate/severe motion degradation of the axial T2 FLAIR sequence and moderate/severe motion degradation of the axial T1 weighted sequence. Diffusion-weighted imaging is mildly motion degraded. There is a punctate focus of diffusion weighted hyperintensity within the left parietal lobe cortex (series 7, image 14). A punctate acute/early subacute infarct is difficult to  exclude versus artifact. There is an additional 2 mm vague focus of diffusion weighted hyperintensity within the left frontal lobe white matter which demonstrates corresponding intermediate signal on the ADC map. This finding is suspicious for a tiny subacute infarct. Redemonstrated small now chronic cortical infarcts within the left frontoparietal lobes, and chronic lacunar infarct within the left caudate head. No evidence of intracranial mass. No midline shift or extra-axial fluid collection. Redemonstrated small focus of chronic blood products along or within the left parietal lobe (series 13, image 35). Mild patchy and confluent T2/FLAIR hyperintensity within the cerebral white matter is nonspecific, but consistent with chronic small vessel ischemic disease. Moderate generalized parenchymal atrophy, progressed since MRI 12/21/2015. Vascular: Flow voids maintained within the proximal large arterial vessels. Skull and upper cervical spine: No focal marrow lesion. Sinuses/Orbits: Visualized orbits demonstrate no acute abnormality. Right maxillary sinus mucous retention cyst. Bilateral mastoid effusions (greater on the left). IMPRESSION: Motion degraded and limited examination as described. Mildly motion degraded diffusion-weighted imaging. Punctate acute/early subacute infarct within the left parietal cortex versus image noise artifact. Additionally, there is suspicion for a 2 mm subacute infarct within the left frontal lobe white matter. Redemonstrated chronic cortical infarcts within the left frontoparietal lobes and chronic lacunar infarct within the left caudate head. Mild chronic small vessel ischemic disease. Moderate generalized parenchymal atrophy, progressed from MRI 12/21/2015. Bilateral mastoid effusions. Electronically Signed   By: Kellie Simmering DO   On: 08/10/2019 21:53    EKG: Independently reviewed.   Assessment/Plan Principal Problem:   Acute CVA (cerebrovascular accident) [facility]), with history  of prior CVA -Patient is currently on Plavix.  Added aspirin and intensify atorvastatin -Carotid Doppler, echocardiogram and continuous cardiac monitoring -Neurology consult -Speech physical therapy and OT consults -N.p.o. until patient passes swallow eval :   HTN (hypertension) -Continue home losartan once medication verified    Acquired hypothyroidism -Continue home levothyroxine    Parkinson's disease ([place]) -Continue carbidopa levodopa  Dementia -Continue Aricept. -Patient also on both Cymbalta and Zoloft as verified by wife        DVT prophylaxis: lovenox  Code Status: full code  Family  Communication: daughter at bedside  Disposition Plan: Back to previous home environment Consults called: neurology     Fred Masse MD [facility]     08/11/2019, 1:57 AM

## 2019-08-11 NOTE — Progress Notes (Signed)
Per MD it is ok for pt to not have IV

## 2019-08-11 NOTE — Evaluation (Signed)
Occupational Therapy Evaluation Patient Details Name: Fred REYNAUD Sr. MRN: [id number] DOB: 11-Feb-1931 Today's Date: 08/11/2019    History of Present Illness Pt is an 86 y.o. male presenting to hospital 08/10/19 with R arm weakness.  MRI showing: "Punctate acute/early subacute infarct within the left parietal cortex versus image noise artifact. Additionally, there is suspicion for a 2 mm subacute infarct within the left frontal lobe white matter."  PMH includes h/o CVA and Parkinson's, CKD, dementia, and htn.   Clinical Impression   Pt was seen for OT evaluation this date. Prior to hospital admission, pt was requiring assist for ADLs/ADL mobility from 24/7 caregivers. [place] his [place]. Currently pt demonstrates impairments as described below (See OT problem list) which functionally limit his ability to perform ADL/self-care tasks. Pt currently requires MOD A for ADL transfers and fxl mobility with RW, Requires MAX A with LB ADLs including LB dressing.  Pt would benefit from skilled OT to address noted impairments and functional limitations (see below for any additional details) in order to maximize safety and independence while minimizing falls risk and caregiver burden. Upon hospital discharge, recommend pt d/c home to [place] with [facility] which pt and wife describe as being available at the facility, to encourage highest level of independence with ADLs that is attainable for pt to increase QOL.     Follow Up Recommendations  Home health OT;Supervision/Assistance - 24 hour    Equipment Recommendations  None recommended by OT    Recommendations for Other Services       Precautions / Restrictions Precautions Precautions: Fall Precaution Comments: Aspiration Restrictions Weight Bearing Restrictions: No      Mobility Bed Mobility Overal bed mobility: Needs Assistance Bed Mobility: Supine to Sit;Sit to Supine     Supine to sit: Mod assist;HOB elevated Sit to  supine: Mod assist   General bed mobility comments: pt able to move B LE's towards edge of bed but required mod assist for trunk to sit onto edge of bed  Transfers Overall transfer level: Needs assistance Equipment used: Rolling walker (2 wheeled) Transfers: Sit to/from Stand Sit to Stand: Min assist;Mod assist         General transfer comment: MIN verbal/tactile cues for hand/foot placement relative to RW for sit<>stand    Balance Overall balance assessment: Needs assistance Sitting-balance support: Bilateral upper extremity supported;Feet supported Sitting balance-Leahy Scale: Poor Sitting balance - Comments: occasional min assist for sitting balance d/t posterior lean (mostly close SBA/CGA) Postural control: Posterior lean Standing balance support: Bilateral upper extremity supported Standing balance-Leahy Scale: Poor Standing balance comment: intermittent posterior lean noted in standing requiring assist to correct and maintain upright                           ADL either performed or assessed with clinical judgement   ADL Overall ADL's : Needs assistance/impaired Eating/Feeding: Set up;Sitting Eating/Feeding Details (indicate cue type and reason): Extended time, needs support for sitting, setup is all thats required in chair, needs MOD A for static sitting to engage in fxl task if sitting EOB. Uses B UEs for seated support as well. Grooming: Dance movement psychotherapist;Set up;Sitting           Upper Body Dressing : Minimal assistance   Lower Body Dressing: Maximal assistance;Sit to/from stand   Toilet Transfer: Moderate assistance;Stand-pivot;BSC;RW   Toileting- Clothing Manipulation and Hygiene: Moderate assistance;+2 for physical assistance Toileting - Clothing Manipulation Details (indicate cue  type and reason): pt requires use of B UEs on RW, if standing for peri care or clothing mgt, one person would have to offer standing support and one would have to complete fxl  task     Functional mobility during ADLs: Minimal assistance;Moderate assistance;Rolling walker(to take 3-4 small shuffling steps BKWD/FWD)       Vision Patient Visual Report: No change from baseline       Perception     Praxis      Pertinent Vitals/Pain Pain Assessment: Faces Faces Pain Scale: No hurt Pain Intervention(s): Limited activity within patient's tolerance;Monitored during session;Repositioned     Hand Dominance Right   Extremity/Trunk Assessment Upper Extremity Assessment Upper Extremity Assessment: RUE deficits/detail;LUE deficits/detail RUE Deficits / Details: grossly >3/5, grip 5/5, moves slightly out of sync with L UE (R side just slightly slower), potentially some dysdiadokinesia, LUE Deficits / Details: grossly >3/5, grip 5/5   Lower Extremity Assessment Lower Extremity Assessment: Defer to PT evaluation RLE Deficits / Details: at least 3/5 hip flexion, knee flexion/extension, and DF/PF AROM; decreased proprioception R great toe LLE Deficits / Details: at least 3/5 hip flexion, knee flexion/extension, and DF/PF AROM       Communication Communication Communication: (some extended time needed to respond)   Cognition Arousal/Alertness: Awake/alert Behavior During Therapy: Flat affect Overall Cognitive Status: Within Functional Limits for tasks assessed                                 General Comments: Pt A&O x3, increased time to initiate tasks with commands, but follows all commands appropriately.   General Comments       Exercises Other Exercises Other Exercises: OT facilitates education re: role of OT in acute setting and d/c recommendations. Pt verbalized understanding.   Shoulder Instructions      Home Living Family/patient expects to be discharged to:: Private residence Living Arrangements: Spouse/significant other Available Help at Discharge: Family;Personal care attendant;Available 24 hours/day Type of Home: Independent  living facility                       Home Equipment: Wheelchair - [employer] chair   Additional Comments: Pt reports living at [facility] with his wife      Prior Functioning/Environment Level of Independence: Needs assistance  Gait / Transfers Assistance Needed: Requires assist with bed mobility and transfers at baseline; will walk a couple minutes for exercise with assist. ADL's / Homemaking Assistance Needed: Independent brushing his teeth and eating.  Assist with bathing.   Comments: Pt has 24/7 caregivers to assist as needed.  No recent falls.  Has been receiving SLP therapy.        OT Problem List: Decreased activity tolerance;Impaired balance (sitting and/or standing);Decreased coordination;Decreased knowledge of use of DME or AE      OT Treatment/Interventions: Self-care/ADL training;Therapeutic exercise;Therapeutic activities;Patient/family education;Balance training    OT Goals(Current goals can be found in the care plan section) Acute Rehab OT Goals Patient Stated Goal: to improve mobility OT Goal Formulation: With patient Time For Goal Achievement: 08/25/19 Potential to Achieve Goals: Good  OT Frequency: Min 1X/week   Barriers to D/C:            Co-evaluation              AM-PAC OT "6 Clicks" Daily Activity     Outcome Measure Help from another person eating meals?: None Help  from another person taking care of personal grooming?: A Little Help from another person toileting, which includes using toliet, bedpan, or urinal?: Total Help from another person bathing (including washing, rinsing, drying)?: A Lot Help from another person to put on and taking off regular upper body clothing?: A Little Help from another person to put on and taking off regular lower body clothing?: A Lot 6 Click Score: 15   End of Session Equipment Utilized During Treatment: Gait belt;Rolling walker  Activity Tolerance: Patient tolerated treatment  well Patient left: in bed;with call bell/phone within reach;with bed alarm set  OT Visit Diagnosis: Unsteadiness on feet (R26.81);Other abnormalities of gait and mobility (R26.89)                Time: [id number] OT Time Calculation (min): 29 min Charges:  OT General Charges $OT Visit: 1 Visit OT Evaluation $OT Eval Moderate Complexity: 1 Mod OT Treatments $Self Care/Home Management : 8-22 mins  Gerrianne Scale, MS, OTR/L ascom [phone] 08/11/19, 4:56 PM

## 2019-08-11 NOTE — Evaluation (Signed)
Physical Therapy Evaluation Patient Details Name: Fred WAHLE Sr. MRN: [id number] DOB: 09/20/1930 Today's Date: 08/11/2019   History of Present Illness  Pt is an 86 y.o. male presenting to hospital 08/10/19 with R arm weakness.  MRI showing: "Punctate acute/early subacute infarct within the left parietal cortex versus image noise artifact. Additionally, there is suspicion for a 2 mm subacute infarct within the left frontal lobe white matter."  PMH includes h/o CVA and Parkinson's, CKD, dementia, and htn.  Clinical Impression  Prior to hospital admission, pt required assist with bed mobility and transfers; has manual w/c and transport chair; walked a couple minutes with assist for exercise (using U-step walker); lives with his wife at [place]; has 24/7 caregivers to assist pt.  Currently pt is mod assist semi-supine to sitting edge of bed; occasional min assist for sitting balance d/t posterior lean; min to mod assist for transfers and walking a few feet bed to recliner with RW (intermittent posterior lean noted requiring assist for balance).  Pt would benefit from skilled PT to address noted impairments and functional limitations (see below for any additional details).  Upon hospital discharge, pt would benefit from HHPT and continued 24/7 assist with functional mobility at home for safety.    Follow Up Recommendations Home health PT;Supervision/Assistance - 24 hour    Equipment Recommendations  Rolling walker with 5" wheels;3in1 (PT);Wheelchair (measurements PT);Wheelchair cushion (measurements PT)    Recommendations for Other Services OT consult     Precautions / Restrictions Precautions Precautions: Fall Precaution Comments: Aspiration Restrictions Weight Bearing Restrictions: No      Mobility  Bed Mobility Overal bed mobility: Needs Assistance Bed Mobility: Supine to Sit     Supine to sit: Mod assist;HOB elevated     General bed mobility  comments: pt able to move B LE's towards edge of bed but required mod assist for trunk to sit onto edge of bed  Transfers Overall transfer level: Needs assistance Equipment used: Rolling walker (2 wheeled) Transfers: Sit to/from Stand Sit to Stand: Min assist;Mod assist         General transfer comment: vc's for UE/LE placement; assist to initiate stand up to walker; assist to control descent sitting down in recliner  Ambulation/Gait Ambulation/Gait assistance: Min assist;Mod assist Gait Distance (Feet): 3 Feet(bed to recliner) Assistive device: Rolling walker (2 wheeled)   Gait velocity: decreased   General Gait Details: intermittent posterior lean noted requiring assist to maintain upright; decreased B LE step length/foot clearance/heelstrike  Stairs            Wheelchair Mobility    Modified Rankin (Stroke Patients Only)       Balance Overall balance assessment: Needs assistance Sitting-balance support: Bilateral upper extremity supported;Feet supported Sitting balance-Leahy Scale: Poor Sitting balance - Comments: occasional min assist for sitting balance d/t posterior lean (mostly close SBA/CGA) Postural control: Posterior lean Standing balance support: Bilateral upper extremity supported Standing balance-Leahy Scale: Poor Standing balance comment: intermittent posterior lean noted in standing requiring assist to correct and maintain upright                             Pertinent Vitals/Pain Pain Assessment: Faces Faces Pain Scale: No hurt Pain Intervention(s): Limited activity within patient's tolerance;Monitored during session;Repositioned  HR 60-76 bpm and O2 sats WFL on room air during sessions activities.    Home Living Family/patient expects to be discharged to:: Private residence Living Arrangements: Spouse/significant  other Available Help at Discharge: Family;Personal care attendant;Available 24 hours/day Type of Home: [place]: Wheelchair - manual;Transport chair(U-step walker) Additional Comments: Pt reports living at [facility] with his wife    Prior Function Level of Independence: Needs assistance   Gait / Transfers Assistance Needed: Requires assist with bed mobility and transfers at baseline; will walk a couple minutes for exercise with assist.  ADL's / Homemaking Assistance Needed: Independent brushing his teeth and eating.  Assist with bathing.  Comments: Pt has 24/7 caregivers to assist as needed.  No recent falls.  Has been receiving SLP therapy.  Information obtained from pt and pt's wife.     Hand Dominance        Extremity/Trunk Assessment   Upper Extremity Assessment Upper Extremity Assessment: Defer to OT evaluation    Lower Extremity Assessment Lower Extremity Assessment: RLE deficits/detail;LLE deficits/detail(B LE sensation and tone appearing intact; heel to shin coordination appearing fairly symmetrical) RLE Deficits / Details: at least 3/5 hip flexion, knee flexion/extension, and DF/PF AROM; decreased proprioception R great toe LLE Deficits / Details: at least 3/5 hip flexion, knee flexion/extension, and DF/PF AROM       Communication   Communication: (Increased time to respond noted at times)  Cognition Arousal/Alertness: Awake/alert Behavior During Therapy: Flat affect Overall Cognitive Status: No family/caregiver present to determine baseline cognitive functioning                                 General Comments: Oriented to person and place; increased time to initiate movement and respond intermittently      General Comments   Nursing cleared pt for participation in physical therapy.  Pt agreeable to PT session.    Exercises     Assessment/Plan    PT Assessment Patient needs continued PT services  PT Problem List Decreased strength;Decreased balance;Decreased mobility;Decreased activity tolerance       PT  Treatment Interventions DME instruction;Gait training;Functional mobility training;Therapeutic activities;Therapeutic exercise;Balance training;Patient/family education    PT Goals (Current goals can be found in the Care Plan section)  Acute Rehab PT Goals Patient Stated Goal: to improve mobility PT Goal Formulation: With patient Time For Goal Achievement: 08/25/19 Potential to Achieve Goals: Fair    Frequency 7X/week   Barriers to discharge   Question level of assist available    Co-evaluation               AM-PAC PT "6 Clicks" Mobility  Outcome Measure Help needed turning from your back to your side while in a flat bed without using bedrails?: A Little Help needed moving from lying on your back to sitting on the side of a flat bed without using bedrails?: A Lot Help needed moving to and from a bed to a chair (including a wheelchair)?: A Lot Help needed standing up from a chair using your arms (e.g., wheelchair or bedside chair)?: A Lot Help needed to walk in hospital room?: A Lot Help needed climbing 3-5 steps with a railing? : Total 6 Click Score: 12    End of Session Equipment Utilized During Treatment: Gait belt Activity Tolerance: Patient tolerated treatment well Patient left: in chair;with call bell/phone within reach;with chair alarm set Nurse Communication: Mobility status;Precautions PT Visit Diagnosis: Other abnormalities of gait and mobility (R26.89);Muscle weakness (generalized) (M62.81);Difficulty in walking, not elsewhere classified (R26.2)    Time:  [id number] PT Time Calculation (min) (ACUTE ONLY): 28 min   Charges:   PT Evaluation $PT Eval Low Complexity: 1 Low PT Treatments $Therapeutic Activity: 8-22 mins        Leitha Bleak, PT 08/11/19, 2:02 PM

## 2019-08-11 NOTE — Plan of Care (Signed)

## 2019-08-11 NOTE — Progress Notes (Signed)
PROGRESS NOTE    Fred Jones  [id number] DOB: 1931-05-19 DOA: 08/10/2019 PCP: Rusty Aus, MD    Assessment & Plan:   Principal Problem:   Acute CVA (cerebrovascular accident) [facility]) Active Problems:   HTN (hypertension)   Acquired hypothyroidism   Parkinson's disease ([place])   History of CVA (cerebrovascular accident)   Stroke ([place])    Fred Jones Sr. is a 86 y.o. Caucasian male with medical history significant for Parkinson's disease, remote history of CVA without residual deficit and on Plavix, CKD 3, hypothyroidism, hypertension and mild dementia who was brought to the emergency room because of a concern for right arm weakness that was noticed by his caregiver and his wife the morning of presentation.   Acute CVA (cerebrovascular accident) [facility]) history of prior CVA -Patient is currently on Plavix.  Added aspirin 81 and intensify atorvastatin -Carotid Doppler, echocardiogram and continuous cardiac monitoring -Neurology consult -Speech physical therapy and OT consults    HTN (hypertension) -Continue home losartan     Acquired hypothyroidism -Continue home levothyroxine    Parkinson's disease ([place]) -Continue carbidopa levodopa  Dementia -Continue Aricept. -continue Cymbalta and Zoloft    DVT prophylaxis: Lovenox SQ Code Status: Full code  Family Communication: updated wife by bedside Disposition Plan: Home tomorrow   Subjective and Interval History:  Pt said he didn't feel different, ate well, defer all questions to wife.     Objective: Vitals:   08/11/19 0747 08/11/19 1417 08/11/19 1701 08/11/19 1942  BP: (!) 149/90  (!) 167/70 (!) 194/86  Pulse: (!) 47 (!) 57 (!) 50 (!) 54  Resp: 19  18 18   Temp: (!) 97.5 F (36.4 C)  98.5 F (36.9 C) 97.8 F (36.6 C)  TempSrc: Oral  Oral Oral  SpO2: 98%  100% 93%  Weight:      Height:        Intake/Output Summary (Last 24 hours) at 08/11/2019 1949 Last data filed at 08/11/2019 1249 Gross per  24 hour  Intake --  Output 600 ml  Net -600 ml   Filed Weights   08/10/19 1531 08/11/19 0042  Weight: 74.4 kg 72.6 kg    Examination:   Constitutional: NAD, alert, though seems to have cognitive deficits HEENT: conjunctivae and lids normal, EOMI CV: RRR no M,R,G. Distal pulses +2.  No cyanosis.   RESP: CTA B/L, normal respiratory effort  GI: +BS, NTND Extremities: No effusions, edema, or tenderness in BLE SKIN: warm, dry and intact Neuro: II - XII grossly intact.  Sensation intact Psych: Normal mood and affect.     Data Reviewed: I have personally reviewed following labs and imaging studies  CBC: Recent Labs  Lab 08/10/19 1534 08/11/19 0520  WBC 10.9* 11.0*  HGB 14.1 12.9*  HCT 43.2 39.2  MCV 95.8 97.0  PLT 283 0000000   Basic Metabolic Panel: Recent Labs  Lab 08/10/19 1534 08/11/19 0520  NA 140 140  K 3.6 4.0  CL 103 105  CO2 31 29  GLUCOSE 105* 105*  BUN 18 20  CREATININE 1.41* 1.42*  CALCIUM 9.8 9.7  MG  --  2.2   GFR: Estimated Creatinine Clearance: 34.9 mL/min (A) (by C-G formula based on SCr of 1.42 mg/dL (H)). Liver Function Tests: No results for input(s): AST, ALT, ALKPHOS, BILITOT, PROT, ALBUMIN in the last 168 hours. No results for input(s): LIPASE, AMYLASE in the last 168 hours. No results for input(s): AMMONIA in the last 168 hours. Coagulation Profile: No results  for input(s): INR, PROTIME in the last 168 hours. Cardiac Enzymes: No results for input(s): CKTOTAL, CKMB, CKMBINDEX, TROPONINI in the last 168 hours. BNP (last 3 results) No results for input(s): PROBNP in the last 8760 hours. HbA1C: Recent Labs    08/11/19 0520  HGBA1C 5.7*   CBG: No results for input(s): GLUCAP in the last 168 hours. Lipid Profile: Recent Labs    08/11/19 0520  CHOL 140  HDL 27*  LDLCALC 85  TRIG 138  CHOLHDL 5.2   Thyroid Function Tests: No results for input(s): TSH, T4TOTAL, FREET4, T3FREE, THYROIDAB in the last 72 hours. Anemia Panel: No  results for input(s): VITAMINB12, FOLATE, FERRITIN, TIBC, IRON, RETICCTPCT in the last 72 hours. Sepsis Labs: No results for input(s): PROCALCITON, LATICACIDVEN in the last 168 hours.  Recent Results (from the past 240 hour(s))  SARS CORONAVIRUS 2 (TAT 6-24 HRS) Nasopharyngeal Nasopharyngeal Swab     Status: None   Collection Time: 08/10/19 11:15 PM   Specimen: Nasopharyngeal Swab  Result Value Ref Range Status   SARS Coronavirus 2 NEGATIVE NEGATIVE Final    Comment: (NOTE) SARS-CoV-2 target nucleic acids are NOT DETECTED. The SARS-CoV-2 RNA is generally detectable in upper and lower respiratory specimens during the acute phase of infection. Negative results do not preclude SARS-CoV-2 infection, do not rule out co-infections with other pathogens, and should not be used as the sole basis for treatment or other patient management decisions. Negative results must be combined with clinical observations, patient history, and epidemiological information. The expected result is Negative. Fact Sheet for Patients: SugarRoll.be Fact Sheet for Healthcare Providers: https://www.woods-mathews.com/ This test is not yet approved or cleared by the [place] FDA and  has been authorized for detection and/or diagnosis of SARS-CoV-2 by FDA under an Emergency Use Authorization (EUA). This EUA will remain  in effect (meaning this test can be used) for the duration of the COVID-19 declaration under Section 56 4(b)(1) of the Act, 21 U.S.C. section 360bbb-3(b)(1), unless the authorization is terminated or revoked sooner. Performed at [facility], [place] [address], [place], [place] [address]       Radiology Studies: CT HEAD WO CONTRAST  Result Date: 08/10/2019 CLINICAL DATA:  FIRST NURSE NOTE- here for right side weakness upon waking this morning. Hx of stroke. LKW 08/09/2019 at 2100. Wife reports difficulty using right hand. Equal grip strength. No  numbness or tingling EXAM: CT HEAD WITHOUT CONTRAST TECHNIQUE: Contiguous axial images were obtained from the base of the skull through the vertex without intravenous contrast. COMPARISON:  04/15/2018 FINDINGS: Brain: No evidence of acute infarction, hemorrhage, hydrocephalus, extra-axial collection or mass lesion/mass effect. There is ventricular greater than sulcal enlargement, reflecting atrophy, unchanged from the prior head CT. Mild periventricular white matter hypoattenuation is also noted consistent with chronic microvascular ischemic change, also stable. Vascular: No hyperdense vessel or unexpected calcification. Skull: Normal. Negative for fracture or focal lesion. Sinuses/Orbits: Visualized globes and orbits are unremarkable. Visualized sinuses are clear. Other: None. IMPRESSION: 1. No acute intracranial abnormalities. 2. Atrophy and mild chronic microvascular ischemic change stable from the prior head CT. Electronically Signed   By: Lajean Manes M.D.   On: 08/10/2019 16:00   MR BRAIN WO CONTRAST  Result Date: 08/10/2019 CLINICAL DATA:  Right arm weakness. Additional history provided: Patient's wife reports he has been weak today, seems to be weaker on the right side. EXAM: MRI HEAD WITHOUT CONTRAST TECHNIQUE: Multiplanar, multiecho pulse sequences of the brain and surrounding structures were obtained without  intravenous contrast. COMPARISON:  Head CT 08/10/2019, MRI/MRA head 12/21/2015 FINDINGS: Brain: The examination is motion degraded, limiting evaluation. Most notably, there is moderate motion degradation of the axial T2 weighted sequence, moderate motion degradation of the axial SWI sequence, moderate/severe motion degradation of the axial T2 FLAIR sequence and moderate/severe motion degradation of the axial T1 weighted sequence. Diffusion-weighted imaging is mildly motion degraded. There is a punctate focus of diffusion weighted hyperintensity within the left parietal lobe cortex (series 7,  image 14). A punctate acute/early subacute infarct is difficult to exclude versus artifact. There is an additional 2 mm vague focus of diffusion weighted hyperintensity within the left frontal lobe white matter which demonstrates corresponding intermediate signal on the ADC map. This finding is suspicious for a tiny subacute infarct. Redemonstrated small now chronic cortical infarcts within the left frontoparietal lobes, and chronic lacunar infarct within the left caudate head. No evidence of intracranial mass. No midline shift or extra-axial fluid collection. Redemonstrated small focus of chronic blood products along or within the left parietal lobe (series 13, image 35). Mild patchy and confluent T2/FLAIR hyperintensity within the cerebral white matter is nonspecific, but consistent with chronic small vessel ischemic disease. Moderate generalized parenchymal atrophy, progressed since MRI 12/21/2015. Vascular: Flow voids maintained within the proximal large arterial vessels. Skull and upper cervical spine: No focal marrow lesion. Sinuses/Orbits: Visualized orbits demonstrate no acute abnormality. Right maxillary sinus mucous retention cyst. Bilateral mastoid effusions (greater on the left). IMPRESSION: Motion degraded and limited examination as described. Mildly motion degraded diffusion-weighted imaging. Punctate acute/early subacute infarct within the left parietal cortex versus image noise artifact. Additionally, there is suspicion for a 2 mm subacute infarct within the left frontal lobe white matter. Redemonstrated chronic cortical infarcts within the left frontoparietal lobes and chronic lacunar infarct within the left caudate head. Mild chronic small vessel ischemic disease. Moderate generalized parenchymal atrophy, progressed from MRI 12/21/2015. Bilateral mastoid effusions. Electronically Signed   By: Kellie Simmering DO   On: 08/10/2019 21:53   US Carotid Bilateral (at [facility] and [facility] only)  Result Date:  08/11/2019 CLINICAL DATA:  86 year old male with a history stroke EXAM: BILATERAL CAROTID DUPLEX ULTRASOUND TECHNIQUE: Pearline Cables scale imaging, color Doppler and duplex ultrasound were performed of bilateral carotid and vertebral arteries in the neck. COMPARISON:  None. FINDINGS: Criteria: Quantification of carotid stenosis is based on velocity parameters that correlate the residual internal carotid diameter with NASCET-based stenosis levels, using the diameter of the distal internal carotid lumen as the denominator for stenosis measurement. The following velocity measurements were obtained: RIGHT ICA:  Systolic 88 cm/sec, Diastolic 17 cm/sec CCA:  77 cm/sec SYSTOLIC ICA/CCA RATIO:  1.1 ECA:  117 cm/sec LEFT ICA:  Systolic [id number] cm/sec, Diastolic 26 cm/sec CCA:  59 cm/sec SYSTOLIC ICA/CCA RATIO:  2.2 ECA:  84 cm/sec Right Brachial SBP: Not acquired Left Brachial SBP: Not acquired RIGHT CAROTID ARTERY: No significant calcifications of the right common carotid artery. Intermediate waveform maintained. Heterogeneous and partially calcified plaque at the right carotid bifurcation. No significant lumen shadowing. Low resistance waveform of the right ICA. No significant tortuosity. RIGHT VERTEBRAL ARTERY: Antegrade flow with low resistance waveform. LEFT CAROTID ARTERY: No significant calcifications of the left common carotid artery. Intermediate waveform maintained. Heterogeneous and partially calcified plaque at the left carotid bifurcation without significant lumen shadowing. Low resistance waveform of the left ICA. No significant tortuosity. LEFT VERTEBRAL ARTERY:  Antegrade flow with low resistance waveform. IMPRESSION: Right: Color duplex indicates minimal heterogeneous and calcified plaque, with  no hemodynamically significant stenosis by duplex criteria in the extracranial cerebrovascular circulation. Left: Heterogeneous and partially calcified plaque at the left carotid bifurcation contributes to 50%-69% stenosis by  established duplex criteria. Signed, Dulcy Fanny. Dellia Nims, RPVI [facility] Radiology Electronically Signed   By: Corrie Mckusick D.O.   On: 08/11/2019 10:18     Scheduled Meds:   stroke: mapping our early stages of recovery book   Does not apply Once   aspirin EC  81 mg Oral Daily   atorvastatin  20 mg Oral q1800   carbidopa-levodopa  1 tablet Oral TID   donepezil  10 mg Oral QHS   DULoxetine  20 mg Oral QHS   enoxaparin (LOVENOX) injection  40 mg Subcutaneous Q24H   sertraline  50 mg Oral QHS   Continuous Infusions:   LOS: 0 days     Enzo Bi, MD [facility] If 7PM-7AM, please contact night-coverage 08/11/2019, 7:49 PM

## 2019-08-11 NOTE — Evaluation (Addendum)
Clinical/Bedside Swallow Evaluation Patient Details  Name: Fred Jones. MRN: [id number] Date of Birth: 02-10-1931  Today's Date: 08/11/2019 Time: SLP Start Time (ACUTE ONLY): [id number] SLP Stop Time (ACUTE ONLY): 0946 SLP Time Calculation (min) (ACUTE ONLY): 41 min  Past Medical History:  Past Medical History:  Diagnosis Date  . Arthritis    fingers, neck  . Benign neoplasm of colon, unspecified   . Calculus of kidney   . Cancer of prostate [facility]) 12/21/2013   3/07 resection  . Cancer of skin   . Cerebrovascular accident (CVA) due to embolism of left anterior cerebral artery ([place]) 12/25/2015   Carotid negative, echo negative, right hand weakness, 6/17  . Chronic kidney disease, stage III (moderate)   . Dementia ([place])   . Depression    unspecified  . Ganglion of tendon sheath   . GERD (gastroesophageal reflux disease)    rare  . History of urethral stricture    postoperative urethral stricture  . Hyperlipidemia, unspecified 12/21/2013  . Hypertension 12/31/2013   Echo s/p fall 11/15  . Kidney stone 12/21/2013  . Lumbar disc disease 12/21/2013  . Malignant neoplasm of prostate ([place])    08/2005 Gleason's  7 (3 4) left base, left mid medial, and left mid lateral. PSA 4.6 with a percent free PSA  . Melanoma ([place])    Stage I, s/p resection 12/2007  . Microscopic hematuria   . Nonspecific finding on examination of urine   . Other hammer toe(s) (acquired), unspecified foot   . Stress incontinence, male   . Stroke ([place])   . Unilateral inguinal hernia, without obstruction or gangrene, not specified as recurrent    unilateral or unspecified  . Vascular disorder of penis    Past Surgical History:  Past Surgical History:  Procedure Laterality Date  . CATARACT EXTRACTION W/PHACO Right 11/27/2014   Procedure: CATARACT EXTRACTION PHACO AND INTRAOCULAR LENS PLACEMENT (IOC);  Surgeon: Leandrew Koyanagi, MD;  Location: [facility];  Service: Ophthalmology;  Laterality: Right;   . COLONOSCOPY     with removal of colonic polyps  . EXTRACORPOREAL SHOCK WAVE LITHOTRIPSY Left 05/2012  . EYE SURGERY Left 09/25/14   cataract @[facility]   . HERNIA REPAIR  1999  . HERNIA REPAIR Bilateral 05/2008   Bilateral inguinal hernia repair --- Dr. Tamala Julian  . KIDNEY STONE SURGERY    . MELANOMA EXCISION  12/2007   melanoma resection  . RETROPUBIC PROSTATECTOMY  09/2005   Radical prostatectomy perineal - Dr. Jacqlyn Larsen  . WRIST GANGLION EXCISION Bilateral    HPI:  Fred MOUNTEER Jones. is a 86 y.o. male with medical history significant for Parkinson's disease, remote history of CVA without residual deficit and on Plavix, CKD 3, hypothyroidism, hypertension and mild dementia who was brought to the emergency room because of a concern for right arm weakness that was noticed by his caregiver and his wife during the morning.  Presented several hours following the admission of the symptoms.  Of the history is taken from the daughter at the bedside who said he was in his usual state of health until that time.  He had no complaints of visual disturbance, numbness or tingling, no chest pain, shortness of breath, fever or chills.  No GI or GU symptoms. MRI on 1/22 stated the following impression: "Mildly motion degraded diffusion-weighted imaging. Punctateacute/early subacute infarct within the left parietal cortex versusimage noise artifact. Additionally, there is suspicion for a 2 mmsubacute infarct within the left frontal lobe white matter. Redemonstrated chronic  cortical infarcts within the leftfrontoparietal lobes and chronic lacunar infarct within the leftcaudate head."   Assessment / Plan / Recommendation Clinical Impression  Patient presented with following characteristics at bedside: suspected generalized oral weakness during oral motor examination and decreased sensation on L side of face as well as decreased facial symmetry (L>R). However, generalized oral weakness & decreased facial sensation on L side as  well facial asymmetry did NOT negatively affect patient's ability to functionally accept PO via cup/spoon/straw (I.e. no anterior oral spillage, no pocketing, no residue on one side, no decreased AP transit). Patient appears to demonstrate an oropharyngeal swallowing function that is grossly WNL without any overt s/s of aspiration consuming ice chips, thins via cup/straw, puree, soft solid, or solid. However, due to suspected Facial Nerve (CN VII) and Trigeminal Nerve (CN V) involvement, recent MRI, and medical history significant for Parkinson's Disease and CVA, patient does have risk factors for aspiration/aspiration PNA as well potential pharyngeal dysphagia that is unable to be determined at bedside. Education provided to patient and RN re: recommendation to resume PO diet today, however continue skilled ST services to follow up on aspiration precautions as needed. SLP also recommended an objective swallowing study via FEES/MBSS if swallowing issues arise during this hospital stay or following d/c. Patient verbalized understanding and agreement to education/recommendations.  Patient agreed upon a Mechanical soft (Dys 3) and Thin liquid diet while following all safe swallowing precautions. He was left with his call bell in reach, bed alarm on, and NSG present at bedside.  Of note, due to hx of Parkinson's Disease and CVA with intermittent confusion noted during session, SLP to consider cognitive-communication evaluation during future session to determine medical necessity for skilled ST services related to cognitive therapy.   SLP Visit Diagnosis: Dysphagia, unspecified (R13.10)    Aspiration Risk  Mild aspiration risk    Diet Recommendation Dysphagia 3 (Mech soft);Thin liquid   Liquid Administration via: Cup;Straw Medication Administration: Crushed with puree Supervision: Intermittent supervision to cue for compensatory strategies Compensations: Minimize environmental distractions;Slow rate;Small  sips/bites;Follow solids with liquid Postural Changes: Seated upright at 90 degrees    Other  Recommendations Oral Care Recommendations: Oral care before and after PO   Follow up Recommendations Other (comment)(TBD)      Frequency and Duration min 2x/week  3 weeks       Prognosis Prognosis for Safe Diet Advancement: Fair Barriers to Reach Goals: Cognitive deficits      Swallow Study   General Date of Onset: 08/10/19 HPI: AKILLES GEIB Jones. is a 86 y.o. male with medical history significant for Parkinson's disease, remote history of CVA without residual deficit and on Plavix, CKD 3, hypothyroidism, hypertension and mild dementia who was brought to the emergency room because of a concern for right arm weakness that was noticed by his caregiver and his wife during the morning.  Presented several hours following the admission of the symptoms.  Of the history is taken from the daughter at the bedside who said he was in his usual state of health until that time.  He had no complaints of visual disturbance, numbness or tingling, no chest pain, shortness of breath, fever or chills.  No GI or GU symptoms. MRI on 1/22 stated the following impression: "Mildly motion degraded diffusion-weighted imaging. Punctateacute/early subacute infarct within the left parietal cortex versusimage noise artifact. Additionally, there is suspicion for a 2 mmsubacute infarct within the left frontal lobe white matter. Redemonstrated chronic cortical infarcts within the leftfrontoparietal lobes and  chronic lacunar infarct within the leftcaudate head." Type of Study: Bedside Swallow Evaluation Previous Swallow Assessment: Patient did not report previous swallowing assessment. Diet Prior to this Study: NPO Temperature Spikes Noted: No Respiratory Status: Room air History of Recent Intubation: No Behavior/Cognition: Alert;Pleasant mood;Lethargic/Drowsy Oral Cavity Assessment: Within Functional Limits Oral Care Completed  by SLP: Yes Oral Cavity - Dentition: Adequate natural dentition Vision: Functional for self-feeding Self-Feeding Abilities: Needs set up Patient Positioning: Upright in bed Baseline Vocal Quality: Low vocal intensity(Possibly baseline d/t hx Parkinson's Disease) Volitional Cough: Strong Volitional Swallow: (Laryngeal movement present)    Oral/Motor/Sensory Function Overall Oral Motor/Sensory Function: Mild impairment Facial ROM: Reduced left Facial Symmetry: Abnormal symmetry left Facial Sensation: Reduced left;Suspected CN V (Trigeminal) dysfunction Lingual ROM: Within Functional Limits Lingual Symmetry: Within Functional Limits Velum: Within Functional Limits Mandible: Within Functional Limits   Ice Chips Ice chips: Within functional limits Presentation: Spoon   Thin Liquid Thin Liquid: Within functional limits Presentation: Self Fed;Cup;Straw;Spoon    Nectar Thick Nectar Thick Liquid: Not tested   Honey Thick Honey Thick Liquid: Not tested   Puree Puree: Within functional limits Presentation: Self Fed;Spoon   Solid     Solid: Within functional limits Presentation: Self Fed;Spoon     Loni Beckwith, M.S. CCC-SLP Speech-Language Pathologist  Loni Beckwith 08/11/2019,11:33 AM

## 2019-08-11 NOTE — Progress Notes (Signed)
Patient admitted to unit. VSS. No acute distress noted. He is lethargic but responds to voice. He is not alert enough to safely consume mediation.  Daughter currently at bedside.   Will continue to monitor closely.

## 2019-08-12 ENCOUNTER — Inpatient Hospital Stay: Payer: PPO

## 2019-08-12 DIAGNOSIS — I639 Cerebral infarction, unspecified: Secondary | ICD-10-CM

## 2019-08-12 LAB — ECHOCARDIOGRAM COMPLETE
Height: 67 in
Weight: 2560 [oz_av]

## 2019-08-12 LAB — BASIC METABOLIC PANEL
Anion gap: 4 — ABNORMAL LOW (ref 5–15)
BUN: 24 mg/dL — ABNORMAL HIGH (ref 8–23)
CO2: 31 mmol/L (ref 22–32)
Calcium: 9.8 mg/dL (ref 8.9–10.3)
Chloride: 104 mmol/L (ref 98–111)
Creatinine, Ser: 1.3 mg/dL — ABNORMAL HIGH (ref 0.61–1.24)
GFR calc Af Amer: 57 mL/min — ABNORMAL LOW (ref >60)
GFR calc non Af Amer: 49 mL/min — ABNORMAL LOW (ref >60)
Glucose, Bld: 111 mg/dL — ABNORMAL HIGH (ref 70–99)
Potassium: 4.2 mmol/L (ref 3.5–5.1)
Sodium: 139 mmol/L (ref 135–145)

## 2019-08-12 LAB — CBC
HCT: 41.3 % (ref 39.0–52.0)
Hemoglobin: 13.5 g/dL (ref 13.0–17.0)
MCH: 31.3 pg (ref 26.0–34.0)
MCHC: 32.7 g/dL (ref 30.0–36.0)
MCV: 95.8 fL (ref 80.0–100.0)
Platelets: 236 10*3/uL (ref 150–400)
RBC: 4.31 MIL/uL (ref 4.22–5.81)
RDW: 14.3 % (ref 11.5–15.5)
WBC: 12.3 10*3/uL — ABNORMAL HIGH (ref 4.0–10.5)
nRBC: 0 % (ref 0.0–0.2)

## 2019-08-12 LAB — MAGNESIUM: Magnesium: 1.9 mg/dL (ref 1.7–2.4)

## 2019-08-12 MED ORDER — SODIUM CHLORIDE 0.9% FLUSH
3.0000 mL | Freq: Two times a day (BID) | INTRAVENOUS | Status: DC
  Administered 2019-08-12 – 2019-08-13 (×2): 3 mL via INTRAVENOUS

## 2019-08-12 NOTE — Consult Note (Signed)
Reason for Consult: confusion  Referring Physician: Dr. Billie Ruddy   CC: confusion   HPI: Fred KHOSLA Sr. is an 86 y.o. male male with medical history significant for Parkinson's disease, remote history of stroke without residual deficit and on Plavix, CKD 3, hypothyroidism, hypertension, dementia who was brought to the emergency room because of a concern for right arm weakness that was noticed by his caregiver and his wife during the morning day of admission.  Patient s/p imaging with a questionable L parietal small infarct.  Patient is confused and disoriented.    Past Medical History:  Diagnosis Date  . Arthritis    fingers, neck  . Benign neoplasm of colon, unspecified   . Calculus of kidney   . Cancer of prostate [facility]) 12/21/2013   3/07 resection  . Cancer of skin   . Cerebrovascular accident (CVA) due to embolism of left anterior cerebral artery ([place]) 12/25/2015   Carotid negative, echo negative, right hand weakness, 6/17  . Chronic kidney disease, stage III (moderate)   . Dementia ([place])   . Depression    unspecified  . Ganglion of tendon sheath   . GERD (gastroesophageal reflux disease)    rare  . History of urethral stricture    postoperative urethral stricture  . Hyperlipidemia, unspecified 12/21/2013  . Hypertension 12/31/2013   Echo s/p fall 11/15  . Kidney stone 12/21/2013  . Lumbar disc disease 12/21/2013  . Malignant neoplasm of prostate ([place])    08/2005 Gleason's  7 (3 4) left base, left mid medial, and left mid lateral. PSA 4.6 with a percent free PSA  . Melanoma ([place])    Stage I, s/p resection 12/2007  . Microscopic hematuria   . Nonspecific finding on examination of urine   . Other hammer toe(s) (acquired), unspecified foot   . Stress incontinence, male   . Stroke ([place])   . Unilateral inguinal hernia, without obstruction or gangrene, not specified as recurrent    unilateral or unspecified  . Vascular disorder of penis     Past Surgical History:  Procedure  Laterality Date  . CATARACT EXTRACTION W/PHACO Right 11/27/2014   Procedure: CATARACT EXTRACTION PHACO AND INTRAOCULAR LENS PLACEMENT (IOC);  Surgeon: Leandrew Koyanagi, MD;  Location: [place];  Service: Ophthalmology;  Laterality: Right;  . COLONOSCOPY     with removal of colonic polyps  . EXTRACORPOREAL SHOCK WAVE LITHOTRIPSY Left 05/2012  . EYE SURGERY Left 09/25/14   cataract @[facility]   . HERNIA REPAIR  1999  . HERNIA REPAIR Bilateral 05/2008   Bilateral inguinal hernia repair --- Dr. Tamala Julian  . KIDNEY STONE SURGERY    . MELANOMA EXCISION  12/2007   melanoma resection  . RETROPUBIC PROSTATECTOMY  09/2005   Radical prostatectomy perineal - Dr. Jacqlyn Larsen  . WRIST GANGLION EXCISION Bilateral     Family History  Problem Relation Age of Onset  . Cancer Other   . Stroke Other   . Heart attack Other   . Stroke Mother   . Cancer Mother   . Heart attack Mother   . Stroke Father   . Cancer Father   . Cancer Sister   . Prostate cancer Brother   . GU problems Neg Hx   . Kidney disease Neg Hx     Social History:  reports that he has never smoked. He has never used smokeless tobacco. He reports current alcohol use of about 4.0 standard drinks of alcohol per week. He reports that he does not use drugs.  No Known Allergies  Medications: I have reviewed the patient's current medications.  ROS: Unable to obtain due to confusion   Physical Examination: Blood pressure (!) 150/71, pulse (!) 46, temperature 97.6 F (36.4 C), temperature source Oral, resp. rate 16, height 5\' 7"  (1.702 m), weight 75.5 kg, SpO2 99 %.   Neurological Examination   Mental Status: Alert, oriented to name only. Cranial Nerves: II: Discs flat bilaterally; Visual fields grossly normal, pupils equal, round, reactive to light and accommodation III,IV, VI: ptosis not present, extra-ocular motions intact bilaterally V,VII: smile symmetric, facial light touch sensation normal bilaterally VIII: hearing normal  bilaterally  Motor: Generalized weakness but no clear focality on exam  Sensory: Pinprick and light touch intact throughout, bilaterally Deep Tendon Reflexes: 1+ and symmetric throughout Plantars: Right: downgoing   Left: downgoing Cerebellar: normal finger-to-nose     Laboratory Studies:   Basic Metabolic Panel: Recent Labs  Lab 08/10/19 1534 08/11/19 0520 08/12/19 0532  NA 140 140 139  K 3.6 4.0 4.2  CL 103 105 104  CO2 31 29 31   GLUCOSE 105* 105* 111*  BUN 18 20 24*  CREATININE 1.41* 1.42* 1.30*  CALCIUM 9.8 9.7 9.8  MG  --  2.2 1.9    Liver Function Tests: No results for input(s): AST, ALT, ALKPHOS, BILITOT, PROT, ALBUMIN in the last 168 hours. No results for input(s): LIPASE, AMYLASE in the last 168 hours. No results for input(s): AMMONIA in the last 168 hours.  CBC: Recent Labs  Lab 08/10/19 1534 08/11/19 0520 08/12/19 0532  WBC 10.9* 11.0* 12.3*  HGB 14.1 12.9* 13.5  HCT 43.2 39.2 41.3  MCV 95.8 97.0 95.8  PLT 283 261 236    Cardiac Enzymes: No results for input(s): CKTOTAL, CKMB, CKMBINDEX, TROPONINI in the last 168 hours.  BNP: Invalid input(s): POCBNP  CBG: No results for input(s): GLUCAP in the last 168 hours.  Microbiology: Results for orders placed or performed during the hospital encounter of 08/10/19  SARS CORONAVIRUS 2 (TAT 6-24 HRS) Nasopharyngeal Nasopharyngeal Swab     Status: None   Collection Time: 08/10/19 11:15 PM   Specimen: Nasopharyngeal Swab  Result Value Ref Range Status   SARS Coronavirus 2 NEGATIVE NEGATIVE Final    Comment: (NOTE) SARS-CoV-2 target nucleic acids are NOT DETECTED. The SARS-CoV-2 RNA is generally detectable in upper and lower respiratory specimens during the acute phase of infection. Negative results do not preclude SARS-CoV-2 infection, do not rule out co-infections with other pathogens, and should not be used as the sole basis for treatment or other patient management decisions. Negative results  must be combined with clinical observations, patient history, and epidemiological information. The expected result is Negative. Fact Sheet for Patients: SugarRoll.be Fact Sheet for Healthcare Providers: https://www.woods-mathews.com/ This test is not yet approved or cleared by the [place] FDA and  has been authorized for detection and/or diagnosis of SARS-CoV-2 by FDA under an Emergency Use Authorization (EUA). This EUA will remain  in effect (meaning this test can be used) for the duration of the COVID-19 declaration under Section 56 4(b)(1) of the Act, 21 U.S.C. section 360bbb-3(b)(1), unless the authorization is terminated or revoked sooner. Performed at [facility], [place] [address], [place], [place] [address]     Coagulation Studies: No results for input(s): LABPROT, INR in the last 72 hours.  Urinalysis: No results for input(s): COLORURINE, LABSPEC, PHURINE, GLUCOSEU, HGBUR, BILIRUBINUR, KETONESUR, PROTEINUR, UROBILINOGEN, NITRITE, LEUKOCYTESUR in the last 168 hours.  Invalid input(s): APPERANCEUR  Lipid Panel:     Component Value Date/Time   CHOL 140 08/11/2019 0520   CHOL 220 (H) 06/14/2014 0422   TRIG 138 08/11/2019 0520   TRIG 214 (H) 06/14/2014 0422   HDL 27 (L) 08/11/2019 0520   HDL 28 (L) 06/14/2014 0422   CHOLHDL 5.2 08/11/2019 0520   VLDL 28 08/11/2019 0520   VLDL 43 (H) 06/14/2014 0422   LDLCALC 85 08/11/2019 0520   LDLCALC 149 (H) 06/14/2014 0422    HgbA1C:  Lab Results  Component Value Date   HGBA1C 5.7 (H) 08/11/2019    Urine Drug Screen:      Component Value Date/Time   LABOPIA NONE DETECTED 12/20/2015 2230   COCAINSCRNUR NONE DETECTED 12/20/2015 2230   LABBENZ NONE DETECTED 12/20/2015 2230   AMPHETMU NONE DETECTED 12/20/2015 2230   THCU NONE DETECTED 12/20/2015 2230   LABBARB NONE DETECTED 12/20/2015 2230    Alcohol Level: No results for input(s): ETH in the last 168 hours.  Other  results: EKG: normal EKG, normal sinus rhythm, unchanged from previous tracings.  Imaging: CT HEAD WO CONTRAST  Result Date: 08/10/2019 CLINICAL DATA:  FIRST NURSE NOTE- here for right side weakness upon waking this morning. Hx of stroke. LKW 08/09/2019 at 2100. Wife reports difficulty using right hand. Equal grip strength. No numbness or tingling EXAM: CT HEAD WITHOUT CONTRAST TECHNIQUE: Contiguous axial images were obtained from the base of the skull through the vertex without intravenous contrast. COMPARISON:  04/15/2018 FINDINGS: Brain: No evidence of acute infarction, hemorrhage, hydrocephalus, extra-axial collection or mass lesion/mass effect. There is ventricular greater than sulcal enlargement, reflecting atrophy, unchanged from the prior head CT. Mild periventricular white matter hypoattenuation is also noted consistent with chronic microvascular ischemic change, also stable. Vascular: No hyperdense vessel or unexpected calcification. Skull: Normal. Negative for fracture or focal lesion. Sinuses/Orbits: Visualized globes and orbits are unremarkable. Visualized sinuses are clear. Other: None. IMPRESSION: 1. No acute intracranial abnormalities. 2. Atrophy and mild chronic microvascular ischemic change stable from the prior head CT. Electronically Signed   By: Lajean Manes M.D.   On: 08/10/2019 16:00   MR BRAIN WO CONTRAST  Result Date: 08/10/2019 CLINICAL DATA:  Right arm weakness. Additional history provided: Patient's wife reports he has been weak today, seems to be weaker on the right side. EXAM: MRI HEAD WITHOUT CONTRAST TECHNIQUE: Multiplanar, multiecho pulse sequences of the brain and surrounding structures were obtained without intravenous contrast. COMPARISON:  Head CT 08/10/2019, MRI/MRA head 12/21/2015 FINDINGS: Brain: The examination is motion degraded, limiting evaluation. Most notably, there is moderate motion degradation of the axial T2 weighted sequence, moderate motion degradation of  the axial SWI sequence, moderate/severe motion degradation of the axial T2 FLAIR sequence and moderate/severe motion degradation of the axial T1 weighted sequence. Diffusion-weighted imaging is mildly motion degraded. There is a punctate focus of diffusion weighted hyperintensity within the left parietal lobe cortex (series 7, image 14). A punctate acute/early subacute infarct is difficult to exclude versus artifact. There is an additional 2 mm vague focus of diffusion weighted hyperintensity within the left frontal lobe white matter which demonstrates corresponding intermediate signal on the ADC map. This finding is suspicious for a tiny subacute infarct. Redemonstrated small now chronic cortical infarcts within the left frontoparietal lobes, and chronic lacunar infarct within the left caudate head. No evidence of intracranial mass. No midline shift or extra-axial fluid collection. Redemonstrated small focus of chronic blood products along or within the left parietal lobe (series 13, image 35). Mild  patchy and confluent T2/FLAIR hyperintensity within the cerebral white matter is nonspecific, but consistent with chronic small vessel ischemic disease. Moderate generalized parenchymal atrophy, progressed since MRI 12/21/2015. Vascular: Flow voids maintained within the proximal large arterial vessels. Skull and upper cervical spine: No focal marrow lesion. Sinuses/Orbits: Visualized orbits demonstrate no acute abnormality. Right maxillary sinus mucous retention cyst. Bilateral mastoid effusions (greater on the left). IMPRESSION: Motion degraded and limited examination as described. Mildly motion degraded diffusion-weighted imaging. Punctate acute/early subacute infarct within the left parietal cortex versus image noise artifact. Additionally, there is suspicion for a 2 mm subacute infarct within the left frontal lobe white matter. Redemonstrated chronic cortical infarcts within the left frontoparietal lobes and chronic  lacunar infarct within the left caudate head. Mild chronic small vessel ischemic disease. Moderate generalized parenchymal atrophy, progressed from MRI 12/21/2015. Bilateral mastoid effusions. Electronically Signed   By: Kellie Simmering DO   On: 08/10/2019 21:53   US Carotid Bilateral (at [facility] and [facility] only)  Result Date: 08/11/2019 CLINICAL DATA:  86 year old male with a history stroke EXAM: BILATERAL CAROTID DUPLEX ULTRASOUND TECHNIQUE: Pearline Cables scale imaging, color Doppler and duplex ultrasound were performed of bilateral carotid and vertebral arteries in the neck. COMPARISON:  None. FINDINGS: Criteria: Quantification of carotid stenosis is based on velocity parameters that correlate the residual internal carotid diameter with NASCET-based stenosis levels, using the diameter of the distal internal carotid lumen as the denominator for stenosis measurement. The following velocity measurements were obtained: RIGHT ICA:  Systolic 88 cm/sec, Diastolic 17 cm/sec CCA:  77 cm/sec SYSTOLIC ICA/CCA RATIO:  1.1 ECA:  117 cm/sec LEFT ICA:  Systolic [id number] cm/sec, Diastolic 26 cm/sec CCA:  59 cm/sec SYSTOLIC ICA/CCA RATIO:  2.2 ECA:  84 cm/sec Right Brachial SBP: Not acquired Left Brachial SBP: Not acquired RIGHT CAROTID ARTERY: No significant calcifications of the right common carotid artery. Intermediate waveform maintained. Heterogeneous and partially calcified plaque at the right carotid bifurcation. No significant lumen shadowing. Low resistance waveform of the right ICA. No significant tortuosity. RIGHT VERTEBRAL ARTERY: Antegrade flow with low resistance waveform. LEFT CAROTID ARTERY: No significant calcifications of the left common carotid artery. Intermediate waveform maintained. Heterogeneous and partially calcified plaque at the left carotid bifurcation without significant lumen shadowing. Low resistance waveform of the left ICA. No significant tortuosity. LEFT VERTEBRAL ARTERY:  Antegrade flow with low resistance  waveform. IMPRESSION: Right: Color duplex indicates minimal heterogeneous and calcified plaque, with no hemodynamically significant stenosis by duplex criteria in the extracranial cerebrovascular circulation. Left: Heterogeneous and partially calcified plaque at the left carotid bifurcation contributes to 50%-69% stenosis by established duplex criteria. Signed, Dulcy Fanny. Dellia Nims, RPVI [facility] Electronically Signed   By: Corrie Mckusick D.O.   On: 08/11/2019 10:18   ECHOCARDIOGRAM COMPLETE  Result Date: 08/12/2019   ECHOCARDIOGRAM REPORT   Patient Name:   NEKODA GOLDWIRE Sr. Date of Exam: 08/11/2019 Medical Rec #:  [id number]          Height:       67.0 in Accession #:    [id number]         Weight:       160.0 lb Date of Birth:  [birth year]          BSA:          1.84 m Patient Age:    89 years           BP:  123/68 mmHg Patient Gender: M                  HR:           48 bpm. Exam Location:  [facility] Procedure: 2D Echo Indications:     STROKE 434.91/I163.9  History:         Patient has prior history of Echocardiogram examinations, most                  recent 12/21/2015. Stroke; Risk Factors:Hypertension.  Sonographer:     Avanell Shackleton Referring Phys:  [id number] Athena Masse Diagnosing Phys: Yolonda Kida MD IMPRESSIONS  1. Left ventricular ejection fraction, by visual estimation, is 65 to 70%. The left ventricle has normal function. Left ventricular septal wall thickness was normal. Normal left ventricular posterior wall thickness. There is mildly increased left ventricular hypertrophy.  2. Left ventricular diastolic parameters are consistent with Grade I diastolic dysfunction (impaired relaxation).  3. The left ventricle has no regional wall motion abnormalities.  4. Global right ventricle has normal systolic function.The right ventricular size is normal. No increase in right ventricular wall thickness.  5. Left atrial size was normal.  6.  Right atrial size was normal.  7. The mitral valve is normal in structure. Trivial mitral valve regurgitation.  8. The tricuspid valve is normal in structure.  9. The tricuspid valve is normal in structure. Tricuspid valve regurgitation is mild. 10. The aortic valve is normal in structure. Aortic valve regurgitation is not visualized. 11. The pulmonic valve was grossly normal. Pulmonic valve regurgitation is not visualized. 12. Mildly elevated pulmonary artery systolic pressure. FINDINGS  Left Ventricle: Left ventricular ejection fraction, by visual estimation, is 65 to 70%. The left ventricle has normal function. The left ventricle has no regional wall motion abnormalities. The left ventricular internal cavity size was the left ventricle is normal in size. Normal left ventricular posterior wall thickness. There is mildly increased left ventricular hypertrophy. Left ventricular diastolic parameters are consistent with Grade I diastolic dysfunction (impaired relaxation). Right Ventricle: The right ventricular size is normal. No increase in right ventricular wall thickness. Global RV systolic function is has normal systolic function. The tricuspid regurgitant velocity is 2.55 m/s, and with an assumed right atrial pressure  of 10 mmHg, the estimated right ventricular systolic pressure is mildly elevated at 36.0 mmHg. Left Atrium: Left atrial size was normal in size. Right Atrium: Right atrial size was normal in size Pericardium: There is no evidence of pericardial effusion. Mitral Valve: The mitral valve is normal in structure. Trivial mitral valve regurgitation. Tricuspid Valve: The tricuspid valve is normal in structure. Tricuspid valve regurgitation is mild. Aortic Valve: The aortic valve is normal in structure. Aortic valve regurgitation is not visualized. Pulmonic Valve: The pulmonic valve was grossly normal. Pulmonic valve regurgitation is not visualized. Pulmonic regurgitation is not visualized. Aorta: The  aortic root is normal in size and structure. IAS/Shunts: No atrial level shunt detected by color flow Doppler.  LEFT VENTRICLE PLAX 2D LVIDd:         4.17 cm  Diastology LVIDs:         2.51 cm  LV e' lateral:   5.44 cm/s LV PW:         0.78 cm  LV E/e' lateral: 11.8 LV IVS:        0.97 cm  LV e' medial:    5.11 cm/s LVOT diam:     2.00 cm  LV E/e' medial:  12.6 LV SV:         55 ml LV SV Index:   29.38 LVOT Area:     3.14 cm  IVC IVC diam: 2.02 cm LEFT ATRIUM             Index       RIGHT ATRIUM           Index LA diam:        3.45 cm 1.88 cm/m  RA Area:     10.00 cm LA Vol (A2C):   58.3 ml 31.70 ml/m RA Volume:   17.90 ml  9.73 ml/m LA Vol (A4C):   34.5 ml 18.76 ml/m LA Biplane Vol: 47.4 ml 25.77 ml/m   AORTA Ao Root diam: 3.80 cm MITRAL VALVE                        TRICUSPID VALVE MV Area (PHT): 2.50 cm             TR Peak grad:   26.0 mmHg MV PHT:        87.87 msec           TR Vmax:        255.00 cm/s MV Decel Time: 303 msec MV E velocity: 64.30 cm/s 103 cm/s  SHUNTS MV A velocity: 74.70 cm/s 70.3 cm/s Systemic Diam: 2.00 cm MV E/A ratio:  0.86       1.5  Dwayne D Callwood MD Electronically signed by Yolonda Kida MD Signature Date/Time: 08/12/2019/9:54:32 AM    Final      Assessment/Plan:  86 y.o. male male with medical history significant for Parkinson's disease, remote history of stroke without residual deficit and on Plavix, CKD 3, hypothyroidism, hypertension, dementia who was brought to the emergency room because of a concern for right arm weakness that was noticed by his caregiver and his wife during the morning day of admission.  Patient s/p imaging with a questionable L parietal small infarct.  Patient is confused and disoriented.    - Confusion in setting of delirium and progressive dementia related like to vascular dementia given the hx of strokes - the small L parietal stroke is likely in setting of small vessel disease - I don't think there is a need for dual anti platelet  therapy given the small vessel disease due to HNT, HLD.  - will d/c ASA, con't home Plavix - carotid US.   - pt/ot  - con't home medications for dementia and PD - Lights on during the day and tv stimulation for delirium  08/12/2019, 11:50 AM

## 2019-08-12 NOTE — Progress Notes (Signed)
Physical Therapy Treatment Patient Details Name: Fred SCHWASS Sr. MRN: [id number] DOB: 10/05/1930 Today's Date: 08/12/2019    History of Present Illness Pt is an 86 y.o. male presenting to hospital 08/10/19 with R arm weakness.  MRI showing: "Punctate acute/early subacute infarct within the left parietal cortex versus image noise artifact. Additionally, there is suspicion for a 2 mm subacute infarct within the left frontal lobe white matter."  PMH includes h/o CVA and Parkinson's, CKD, dementia, and htn.    PT Comments    Pt resting in bed upon PT arrival (pt's nurse reports pt recently got back to bed and required 2 assist).  Pt agreeable to PT session; pt's wife present during session.  Mod assist with bed mobility; min to mod assist for sitting balance initially; initially max assist to stand from bed but progressed to mod assist to stand from bed and then mod assist to stand from recliner.  Initially in standing therapy focused on keeping B feet flat on floor--d/t pt tending to stay on his heels with posterior lean; also focused on achieving midline d/t pt with more of a R lean compared to posterior lean today (pt able to correct some but unable to maintain).  Mod to max assist x1 to take steps bed to recliner with RW d/t significant R lean.  Pt rested in chair and then pt appearing with decreased R lean and able to walk from recliner to bed with min to mod assist x1 and RW use.  Decreased R lean noted sitting on edge of bed end of session (close SBA provided for safety).  Pt's wife reports pt does fluctuate with mobility.  Will continue to focus on strengthening, balance, and progressive functional mobility per pt tolerance.   Follow Up Recommendations  Home health PT;Supervision/Assistance - 24 hour     Equipment Recommendations  Rolling walker with 5" wheels;3in1 (PT);Wheelchair (measurements PT);Wheelchair cushion (measurements PT)    Recommendations for Other Services OT consult      Precautions / Restrictions Precautions Precautions: Fall Precaution Comments: Aspiration Restrictions Weight Bearing Restrictions: No    Mobility  Bed Mobility Overal bed mobility: Needs Assistance Bed Mobility: Supine to Sit;Sit to Supine     Supine to sit: Mod assist;HOB elevated Sit to supine: Mod assist   General bed mobility comments: assist for B LE's and trunk semi-supine to/from sitting  Transfers Overall transfer level: Needs assistance Equipment used: Rolling walker (2 wheeled) Transfers: Sit to/from [facility] Sit to Stand: Max assist;Mod assist Stand pivot transfers: Mod assist;Max assist       General transfer comment: max assist to stand from bed 1st trial; mod assist to stand from bed 2nd trial; mod assist to stand from recliner x1 trial; posterior R lean noted with all transfers (although less when standing from recliner); vc's and tactile cues required for positioning for transfers; stand step turn bed to recliner with mod to max assist (d/t significant R lean requiring assist to maintain midline and for overall balance; pt with difficulty taking steps requiring vc's and visual cues)  Ambulation/Gait Ambulation/Gait assistance: Min assist;Mod assist Gait Distance (Feet): 3 Feet(recliner to bed) Assistive device: Rolling walker (2 wheeled)   Gait velocity: decreased   General Gait Details: mild R lean noted with taking steps recliner to bed with RW; min to mod assist for balance and vc's for taking steps and positioning   Chief Strategy Officer  Modified Rankin (Stroke Patients Only)       Balance Overall balance assessment: Needs assistance Sitting-balance support: Bilateral upper extremity supported;Feet supported Sitting balance-Leahy Scale: Poor Sitting balance - Comments: intermittent R lean noted in sitting (more at beginning of session sitting edge of bed but minimal end of session sitting on edge of  bed); assist varying from close SBA to mod assist for midline Postural control: Right lateral lean Standing balance support: Bilateral upper extremity supported Standing balance-Leahy Scale: Poor Standing balance comment: R lean more significant beginning of session in standing but decreased end of session with standing activities--all with RW use (assist varying from max assist to min assist) to maintain upright midline posture                            Cognition Arousal/Alertness: Awake/alert Behavior During Therapy: Flat affect                                   General Comments: Oriented to person, place, and situation      Exercises  Balance and transfer training    General Comments   Nursing cleared pt for participation in physical therapy.  Pt agreeable to PT session.      Pertinent Vitals/Pain Pain Assessment: No/denies pain Pain Intervention(s): Limited activity within patient's tolerance;Monitored during session;Repositioned  HR 53-71 bpm during session's activities.  O2 sats 92% on room air beginning of session (difficult to obtain additional readings during session--therapist attempted to warm up pt's fingers but still unable to get reading)    Home Living                      Prior Function            PT Goals (current goals can now be found in the care plan section) Acute Rehab PT Goals Patient Stated Goal: to improve mobility PT Goal Formulation: With patient Time For Goal Achievement: 08/25/19 Potential to Achieve Goals: Fair Progress towards PT goals: Progressing toward goals    Frequency    7X/week      PT Plan Current plan remains appropriate    Co-evaluation              AM-PAC PT "6 Clicks" Mobility   Outcome Measure  Help needed turning from your back to your side while in a flat bed without using bedrails?: A Little Help needed moving from lying on your back to sitting on the side of a flat bed  without using bedrails?: A Lot Help needed moving to and from a bed to a chair (including a wheelchair)?: A Lot Help needed standing up from a chair using your arms (e.g., wheelchair or bedside chair)?: A Lot Help needed to walk in hospital room?: A Lot Help needed climbing 3-5 steps with a railing? : Total 6 Click Score: 12    End of Session Equipment Utilized During Treatment: Gait belt Activity Tolerance: Patient tolerated treatment well Patient left: in bed;with call bell/phone within reach;with bed alarm set;with family/visitor present Nurse Communication: Mobility status;Precautions(pt needing external catheter addressed (was not hooked up to suction when therapist entered room)) PT Visit Diagnosis: Other abnormalities of gait and mobility (R26.89);Muscle weakness (generalized) (M62.81);Difficulty in walking, not elsewhere classified (R26.2)     Time: [id number] PT Time Calculation (min) (ACUTE ONLY): 48 min  Charges:  $Therapeutic Activity:  38-52 mins                     Leitha Bleak, PT 08/12/19, 3:47 PM

## 2019-08-12 NOTE — Progress Notes (Signed)
PROGRESS NOTE    Fred Jones  [id number] DOB: June 27, 1931 DOA: 08/10/2019 PCP: Fred Aus, MD    Assessment & Plan:   Principal Problem:   Acute CVA (cerebrovascular accident) [facility]) Active Problems:   HTN (hypertension)   Acquired hypothyroidism   Parkinson's disease (Fred Jones)   History of CVA (cerebrovascular accident)   Stroke (Fred Jones)    Fred Neither Sr. is a 86 y.o. Caucasian male with medical history significant for Parkinson's disease, remote history of CVA without residual deficit and on Plavix, CKD 3, hypothyroidism, hypertension and mild dementia who was brought to the emergency room because of a concern for right arm weakness that was noticed by his caregiver and his wife the morning of presentation.   Acute CVA (cerebrovascular accident) [facility]) history of prior CVA --small L parietal stroke is likely in setting of small vessel disease, per neuro, therefore, no need for dual-anti-plt therapy.  Pt was on plavix at home previously.  ASA was added on presentation. PLAN: --Neuro consult today --d/c ASA and continue home plavix -Carotid Doppler, echocardiogram and continuous cardiac monitoring -Speech physical therapy and OT consults    HTN (hypertension) -Continue home losartan     Acquired hypothyroidism -Continue home levothyroxine    Parkinson's disease (HCC) -Continue carbidopa levodopa  Dementia, likely vascular -Continue Aricept. -continue Cymbalta and Zoloft    DVT prophylaxis: Lovenox SQ Code Status: Full code  Family Communication: not today Disposition Plan: Home tomorrow   Subjective and Interval History:  Pt reported feeling fine, no complaints.  Wanted to go home.  No fever, dyspnea, chest pain, abdominal pain, N/V/D, dysuria, increased swelling.    Objective: Vitals:   08/12/19 0425 08/12/19 0755 08/12/19 1201 08/12/19 1533  BP: (!) 161/77 (!) 150/71 (!) 156/83 (!) 168/83  Pulse: (!) 46 (!) 46 84 (!) 52  Resp: 17 16  17     Temp: 97.6 F (36.4 C) 97.6 F (36.4 C)  (!) 97.4 F (36.3 C)  TempSrc: Oral Oral  Oral  SpO2: 93% 99% (!) 73% 100%  Weight: 75.5 kg     Height:        Intake/Output Summary (Last 24 hours) at 08/12/2019 1649 Last data filed at 08/12/2019 0427 Gross per 24 hour  Intake --  Output 150 ml  Net -150 ml   Filed Weights   08/10/19 1531 08/11/19 0042 08/12/19 0425  Weight: 74.4 kg 72.6 kg 75.5 kg    Examination:   Constitutional: NAD, alert, though seems to have cognitive deficits HEENT: conjunctivae and lids normal, EOMI CV: RRR no M,R,G. Distal pulses +2.  No cyanosis.   RESP: CTA B/L, normal respiratory effort  GI: +BS, NTND Extremities: No effusions, edema, or tenderness in BLE SKIN: warm, dry and intact Neuro: II - XII grossly intact.  Sensation intact.  Strength 5/5 in both arms. Psych: Normal mood and affect.     Data Reviewed: I have personally reviewed following labs and imaging studies  CBC: Recent Labs  Lab 08/10/19 1534 08/11/19 0520 08/12/19 0532  WBC 10.9* 11.0* 12.3*  HGB 14.1 12.9* 13.5  HCT 43.2 39.2 41.3  MCV 95.8 97.0 95.8  PLT 283 261 [id number]   Basic Metabolic Panel: Recent Labs  Lab 08/10/19 1534 08/11/19 0520 08/12/19 0532  NA 140 140 139  K 3.6 4.0 4.2  CL 103 105 104  CO2 31 29 31   GLUCOSE 105* 105* 111*  BUN 18 20 24*  CREATININE 1.41* 1.42* 1.30*  CALCIUM 9.8 9.7  9.8  MG  --  2.2 1.9   GFR: Estimated Creatinine Clearance: 38.1 mL/min (A) (by C-G formula based on SCr of 1.3 mg/dL (H)). Liver Function Tests: No results for input(s): AST, ALT, ALKPHOS, BILITOT, PROT, ALBUMIN in the last 168 hours. No results for input(s): LIPASE, AMYLASE in the last 168 hours. No results for input(s): AMMONIA in the last 168 hours. Coagulation Profile: No results for input(s): INR, PROTIME in the last 168 hours. Cardiac Enzymes: No results for input(s): CKTOTAL, CKMB, CKMBINDEX, TROPONINI in the last 168 hours. BNP (last 3 results) No results  for input(s): PROBNP in the last 8760 hours. HbA1C: Recent Labs    08/11/19 0520  HGBA1C 5.7*   CBG: No results for input(s): GLUCAP in the last 168 hours. Lipid Profile: Recent Labs    08/11/19 0520  CHOL 140  HDL 27*  LDLCALC 85  TRIG 138  CHOLHDL 5.2   Thyroid Function Tests: No results for input(s): TSH, T4TOTAL, FREET4, T3FREE, THYROIDAB in the last 72 hours. Anemia Panel: No results for input(s): VITAMINB12, FOLATE, FERRITIN, TIBC, IRON, RETICCTPCT in the last 72 hours. Sepsis Labs: No results for input(s): PROCALCITON, LATICACIDVEN in the last 168 hours.  Recent Results (from the past 240 hour(s))  SARS CORONAVIRUS 2 (TAT 6-24 HRS) Nasopharyngeal Nasopharyngeal Swab     Status: None   Collection Time: 08/10/19 11:15 PM   Specimen: Nasopharyngeal Swab  Result Value Ref Range Status   SARS Coronavirus 2 NEGATIVE NEGATIVE Final    Comment: (NOTE) SARS-CoV-2 target nucleic acids are NOT DETECTED. The SARS-CoV-2 RNA is generally detectable in upper and lower respiratory specimens during the acute phase of infection. Negative results do not preclude SARS-CoV-2 infection, do not rule out co-infections with other pathogens, and should not be used as the sole basis for treatment or other patient management decisions. Negative results must be combined with clinical observations, patient history, and epidemiological information. The expected result is Negative. Fact Sheet for Patients: SugarRoll.be Fact Sheet for Healthcare Providers: https://www.woods-mathews.com/ This test is not yet approved or cleared by the [place] FDA and  has been authorized for detection and/or diagnosis of SARS-CoV-2 by FDA under an Emergency Use Authorization (EUA). This EUA will remain  in effect (meaning this test can be used) for the duration of the COVID-19 declaration under Section 56 4(b)(1) of the Act, 21 U.S.C. section 360bbb-3(b)(1),  unless the authorization is terminated or revoked sooner. Performed at [facility], [place] [address] [place], [place] [address]       Radiology Studies: MR BRAIN WO CONTRAST  Result Date: 08/10/2019 CLINICAL DATA:  Right arm weakness. Additional history provided: Patient's wife reports he has been weak today, seems to be weaker on the right side. EXAM: MRI HEAD WITHOUT CONTRAST TECHNIQUE: Multiplanar, multiecho pulse sequences of the brain and surrounding structures were obtained without intravenous contrast. COMPARISON:  Head CT 08/10/2019, MRI/MRA head 12/21/2015 FINDINGS: Brain: The examination is motion degraded, limiting evaluation. Most notably, there is moderate motion degradation of the axial T2 weighted sequence, moderate motion degradation of the axial SWI sequence, moderate/severe motion degradation of the axial T2 FLAIR sequence and moderate/severe motion degradation of the axial T1 weighted sequence. Diffusion-weighted imaging is mildly motion degraded. There is a punctate focus of diffusion weighted hyperintensity within the left parietal lobe cortex (series 7, image 14). A punctate acute/early subacute infarct is difficult to exclude versus artifact. There is an additional 2 mm vague focus of diffusion weighted hyperintensity within the  left frontal lobe white matter which demonstrates corresponding intermediate signal on the ADC map. This finding is suspicious for a tiny subacute infarct. Redemonstrated small now chronic cortical infarcts within the left frontoparietal lobes, and chronic lacunar infarct within the left caudate head. No evidence of intracranial mass. No midline shift or extra-axial fluid collection. Redemonstrated small focus of chronic blood products along or within the left parietal lobe (series 13, image 35). Mild patchy and confluent T2/FLAIR hyperintensity within the cerebral white matter is nonspecific, but consistent with chronic small vessel ischemic disease.  Moderate generalized parenchymal atrophy, progressed since MRI 12/21/2015. Vascular: Flow voids maintained within the proximal large arterial vessels. Skull and upper cervical spine: No focal marrow lesion. Sinuses/Orbits: Visualized orbits demonstrate no acute abnormality. Right maxillary sinus mucous retention cyst. Bilateral mastoid effusions (greater on the left). IMPRESSION: Motion degraded and limited examination as described. Mildly motion degraded diffusion-weighted imaging. Punctate acute/early subacute infarct within the left parietal cortex versus image noise artifact. Additionally, there is suspicion for a 2 mm subacute infarct within the left frontal lobe white matter. Redemonstrated chronic cortical infarcts within the left frontoparietal lobes and chronic lacunar infarct within the left caudate head. Mild chronic small vessel ischemic disease. Moderate generalized parenchymal atrophy, progressed from MRI 12/21/2015. Bilateral mastoid effusions. Electronically Signed   By: Kellie Simmering DO   On: 08/10/2019 21:53   US Carotid Bilateral (at [facility] and [facility] only)  Result Date: 08/11/2019 CLINICAL DATA:  86 year old male with a history stroke EXAM: BILATERAL CAROTID DUPLEX ULTRASOUND TECHNIQUE: Pearline Cables scale imaging, color Doppler and duplex ultrasound were performed of bilateral carotid and vertebral arteries in the neck. COMPARISON:  None. FINDINGS: Criteria: Quantification of carotid stenosis is based on velocity parameters that correlate the residual internal carotid diameter with NASCET-based stenosis levels, using the diameter of the distal internal carotid lumen as the denominator for stenosis measurement. The following velocity measurements were obtained: RIGHT ICA:  Systolic 88 cm/sec, Diastolic 17 cm/sec CCA:  77 cm/sec SYSTOLIC ICA/CCA RATIO:  1.1 ECA:  117 cm/sec LEFT ICA:  Systolic [id number] cm/sec, Diastolic 26 cm/sec CCA:  59 cm/sec SYSTOLIC ICA/CCA RATIO:  2.2 ECA:  84 cm/sec Right Brachial SBP: Not  acquired Left Brachial SBP: Not acquired RIGHT CAROTID ARTERY: No significant calcifications of the right common carotid artery. Intermediate waveform maintained. Heterogeneous and partially calcified plaque at the right carotid bifurcation. No significant lumen shadowing. Low resistance waveform of the right ICA. No significant tortuosity. RIGHT VERTEBRAL ARTERY: Antegrade flow with low resistance waveform. LEFT CAROTID ARTERY: No significant calcifications of the left common carotid artery. Intermediate waveform maintained. Heterogeneous and partially calcified plaque at the left carotid bifurcation without significant lumen shadowing. Low resistance waveform of the left ICA. No significant tortuosity. LEFT VERTEBRAL ARTERY:  Antegrade flow with low resistance waveform. IMPRESSION: Right: Color duplex indicates minimal heterogeneous and calcified plaque, with no hemodynamically significant stenosis by duplex criteria in the extracranial cerebrovascular circulation. Left: Heterogeneous and partially calcified plaque at the left carotid bifurcation contributes to 50%-69% stenosis by established duplex criteria. Signed, Dulcy Fanny. Dellia Nims, RPVI [facility] Electronically Signed   By: Corrie Mckusick D.O.   On: 08/11/2019 10:18   ECHOCARDIOGRAM COMPLETE  Result Date: 08/12/2019   ECHOCARDIOGRAM REPORT   Patient Name:   CHUCKIE VILLAPUDUA Sr. Date of Exam: 08/11/2019 Medical Rec #:  [id number]          Height:       67.0 in Accession #:  [id number]         Weight:       160.0 lb Date of Birth:  [birth year]          BSA:          1.84 m Patient Age:    64 years           BP:           123/68 mmHg Patient Gender: M                  HR:           48 bpm. Exam Location:  [facility] Procedure: 2D Echo Indications:     STROKE 434.91/I163.9  History:         Patient has prior history of Echocardiogram examinations, most                  recent 12/21/2015. Stroke; Risk  Factors:Hypertension.  Sonographer:     Avanell Shackleton Referring Phys:  [id number] Athena Masse Diagnosing Phys: Yolonda Kida MD IMPRESSIONS  1. Left ventricular ejection fraction, by visual estimation, is 65 to 70%. The left ventricle has normal function. Left ventricular septal wall thickness was normal. Normal left ventricular posterior wall thickness. There is mildly increased left ventricular hypertrophy.  2. Left ventricular diastolic parameters are consistent with Grade I diastolic dysfunction (impaired relaxation).  3. The left ventricle has no regional wall motion abnormalities.  4. Global right ventricle has normal systolic function.The right ventricular size is normal. No increase in right ventricular wall thickness.  5. Left atrial size was normal.  6. Right atrial size was normal.  7. The mitral valve is normal in structure. Trivial mitral valve regurgitation.  8. The tricuspid valve is normal in structure.  9. The tricuspid valve is normal in structure. Tricuspid valve regurgitation is mild. 10. The aortic valve is normal in structure. Aortic valve regurgitation is not visualized. 11. The pulmonic valve was grossly normal. Pulmonic valve regurgitation is not visualized. 12. Mildly elevated pulmonary artery systolic pressure. FINDINGS  Left Ventricle: Left ventricular ejection fraction, by visual estimation, is 65 to 70%. The left ventricle has normal function. The left ventricle has no regional wall motion abnormalities. The left ventricular internal cavity size was the left ventricle is normal in size. Normal left ventricular posterior wall thickness. There is mildly increased left ventricular hypertrophy. Left ventricular diastolic parameters are consistent with Grade I diastolic dysfunction (impaired relaxation). Right Ventricle: The right ventricular size is normal. No increase in right ventricular wall thickness. Global RV systolic function is has normal systolic function. The tricuspid  regurgitant velocity is 2.55 m/s, and with an assumed right atrial pressure  of 10 mmHg, the estimated right ventricular systolic pressure is mildly elevated at 36.0 mmHg. Left Atrium: Left atrial size was normal in size. Right Atrium: Right atrial size was normal in size Pericardium: There is no evidence of pericardial effusion. Mitral Valve: The mitral valve is normal in structure. Trivial mitral valve regurgitation. Tricuspid Valve: The tricuspid valve is normal in structure. Tricuspid valve regurgitation is mild. Aortic Valve: The aortic valve is normal in structure. Aortic valve regurgitation is not visualized. Pulmonic Valve: The pulmonic valve was grossly normal. Pulmonic valve regurgitation is not visualized. Pulmonic regurgitation is not visualized. Aorta: The aortic root is normal in size and structure. IAS/Shunts: No atrial level shunt detected by color flow Doppler.  LEFT VENTRICLE PLAX 2D LVIDd:  4.17 cm  Diastology LVIDs:         2.51 cm  LV e' lateral:   5.44 cm/s LV PW:         0.78 cm  LV E/e' lateral: 11.8 LV IVS:        0.97 cm  LV e' medial:    5.11 cm/s LVOT diam:     2.00 cm  LV E/e' medial:  12.6 LV SV:         55 ml LV SV Index:   29.38 LVOT Area:     3.14 cm  IVC IVC diam: 2.02 cm LEFT ATRIUM             Index       RIGHT ATRIUM           Index LA diam:        3.45 cm 1.88 cm/m  RA Area:     10.00 cm LA Vol (A2C):   58.3 ml 31.70 ml/m RA Volume:   17.90 ml  9.73 ml/m LA Vol (A4C):   34.5 ml 18.76 ml/m LA Biplane Vol: 47.4 ml 25.77 ml/m   AORTA Ao Root diam: 3.80 cm MITRAL VALVE                        TRICUSPID VALVE MV Area (PHT): 2.50 cm             TR Peak grad:   26.0 mmHg MV PHT:        87.87 msec           TR Vmax:        255.00 cm/s MV Decel Time: 303 msec MV E velocity: 64.30 cm/s 103 cm/s  SHUNTS MV A velocity: 74.70 cm/s 70.3 cm/s Systemic Diam: 2.00 cm MV E/A ratio:  0.86       1.5  Dwayne D Callwood MD Electronically signed by Yolonda Kida MD Signature  Date/Time: 08/12/2019/9:54:32 AM    Final      Scheduled Meds: .  stroke: mapping our early stages of recovery book   Does not apply Once  . atorvastatin  20 mg Oral q1800  . carbidopa-levodopa  1 tablet Oral QHS  . carbidopa-levodopa  1 tablet Oral TID  . clopidogrel  75 mg Oral Daily  . donepezil  5 mg Oral QHS  . DULoxetine  20 mg Oral QHS  . enoxaparin (LOVENOX) injection  40 mg Subcutaneous Q24H  . levothyroxine  75 mcg Oral Q0600  . losartan  25 mg Oral Daily  . pantoprazole  40 mg Oral Daily  . sertraline  50 mg Oral QHS   Continuous Infusions:   LOS: 1 day     Enzo Bi, MD [facility] If 7PM-7AM, please contact night-coverage 08/12/2019, 4:49 PM

## 2019-08-12 NOTE — Plan of Care (Signed)
  Problem: Clinical Measurements: ?Goal: Diagnostic test results will improve ?Outcome: Progressing ?Goal: Cardiovascular complication will be avoided ?Outcome: Progressing ?  ?Problem: Safety: ?Goal: Ability to remain free from injury will improve ?Outcome: Progressing ?  ?

## 2019-08-13 LAB — BASIC METABOLIC PANEL
Anion gap: 5 (ref 5–15)
BUN: 22 mg/dL (ref 8–23)
CO2: 30 mmol/L (ref 22–32)
Calcium: 10 mg/dL (ref 8.9–10.3)
Chloride: 107 mmol/L (ref 98–111)
Creatinine, Ser: 1.24 mg/dL (ref 0.61–1.24)
GFR calc Af Amer: >60 mL/min (ref >60)
GFR calc non Af Amer: 52 mL/min — ABNORMAL LOW (ref >60)
Glucose, Bld: 107 mg/dL — ABNORMAL HIGH (ref 70–99)
Potassium: 4 mmol/L (ref 3.5–5.1)
Sodium: 142 mmol/L (ref 135–145)

## 2019-08-13 LAB — CBC
HCT: 42 % (ref 39.0–52.0)
Hemoglobin: 14.1 g/dL (ref 13.0–17.0)
MCH: 31.8 pg (ref 26.0–34.0)
MCHC: 33.6 g/dL (ref 30.0–36.0)
MCV: 94.6 fL (ref 80.0–100.0)
Platelets: 254 10*3/uL (ref 150–400)
RBC: 4.44 MIL/uL (ref 4.22–5.81)
RDW: 14 % (ref 11.5–15.5)
WBC: 13 10*3/uL — ABNORMAL HIGH (ref 4.0–10.5)
nRBC: 0 % (ref 0.0–0.2)

## 2019-08-13 LAB — MAGNESIUM: Magnesium: 1.9 mg/dL (ref 1.7–2.4)

## 2019-08-13 MED ORDER — STROKE: EARLY STAGES OF RECOVERY BOOK: 1.0000 | Freq: Once | Status: AC

## 2019-08-13 MED ORDER — ATORVASTATIN CALCIUM 10 MG PO TABS: 20.0000 mg | ORAL_TABLET | Freq: Every day | ORAL | 0 refills | Status: AC

## 2019-08-13 NOTE — Plan of Care (Signed)
No decline in neuro status noted.

## 2019-08-13 NOTE — TOC Initial Note (Signed)
Transition of Care (TOC) - Initial/Assessment Note    Patient Details  Name: Fred DUBS Sr. MRN: [id number] Date of Birth: 09-26-1930  Transition of Care [facility]) CM/SW Contact:    Eileen Stanford, LCSW Phone Number: 08/13/2019, 10:25 AM  Clinical Narrative: CSW spoke with pt at bedside. Pt states he is from [facility]. Pt states he is getting [place] services at the facility but could not recall name of agency. Pt states he has all the equipment he needs at ALF. Pt suggest CSW call his wife to ask her. CSW spoke with pt's wife and she states she can not remember either. CSW reached out to [facility] agencies to determine which agency services [place] residents.  CSW received a call from [facility] and pt was getting [place] services through their in home agency. [facility] is requesting CSW send the orders and they will get it all set up. Orders sent. Pt will d/c.                   Expected Discharge Plan: [place] Barriers to Discharge: No Barriers Identified   Patient Goals and CMS Choice Patient states their goals for this hospitalization and ongoing recovery are:: to go home      Expected Discharge Plan and Services Expected Discharge Plan: [place] In-house Referral: Clinical Social Work   Post Acute Care Choice: [employer] arrangements for the past 2 months: [place] Expected Discharge Date: 08/13/19                         HH Arranged: PT [place]: [place] Date [place]: 08/13/19 Time HH Agency Contacted: 17 Representative spoke with at [place]: Malachy Mood  Prior Living Arrangements/Services Living arrangements for the past 2 months: [place] with:: Spouse Patient language and need for interpreter reviewed:: Yes Do you feel safe going back to the place where you live?: Yes      Need for Family Participation in Patient Care: Yes (Comment) Care giver support system in  place?: Yes (comment) Current home services: Home PT Criminal Activity/Legal Involvement Pertinent to Current Situation/Hospitalization: No - Comment as needed  Activities of Daily Living      Permission Sought/Granted Permission sought to share information with : Family Supports    Share Information with NAME: Fred Jones  Permission granted to share info w AGENCY: [facility]  Permission granted to share info w Relationship: Wife     Emotional Assessment Appearance:: Appears stated age Attitude/Demeanor/Rapport: Engaged Affect (typically observed): Accepting, Appropriate Orientation: : Oriented to Self, Oriented to Place, Oriented to  Time, Oriented to Situation   Psych Involvement: No (comment)  Admission diagnosis:  Stroke (cerebrum) (HCC) [I63.9] Right arm weakness [R29.898] Acute CVA (cerebrovascular accident) ([place]) [I63.9] Cerebrovascular accident (CVA), unspecified mechanism ([place]) [I63.9] Stroke [facility]) [I63.9] Patient Active Problem List   Diagnosis Date Noted  . Stroke ([place]) 08/11/2019  . Acute CVA (cerebrovascular accident) ([place]) 08/10/2019  . Acquired hypothyroidism 08/10/2019  . Parkinson's disease ([place]) 08/10/2019  . History of CVA (cerebrovascular accident) 08/10/2019  . CAP (community acquired pneumonia) 07/23/2017  . Weakness 07/07/2016  . Stroke (cerebrum) ([place]) 12/20/2015  . HTN (hypertension) 12/20/2015  . GERD (gastroesophageal reflux disease) 12/20/2015  . Depression 12/20/2015   PCP:  Rusty Aus, MD Pharmacy:   [facility], [address] [place] [address] Phone:  [phone] Fax: [phone]  [place], [place] [address] [place] [address] Phone: [phone] Fax: [phone]     Social Determinants of Health (SDOH) Interventions    Readmission Risk Interventions No flowsheet data found.

## 2019-08-13 NOTE — Progress Notes (Signed)
Iv and telemetry removed for discharge. Medication and discharge instructions reviewed with pt daughter over the phone. She also states the medication was reviewed with her over the phone by the MD. She choose not to come inside. Pt was wheeled down in w.c, no distress noted.

## 2019-08-13 NOTE — TOC Transition Note (Signed)
Transition of Care [facility]) - CM/SW Discharge Note   Patient Details  Name: ERCEL SEPTER Sr. MRN: [id number] Date of Birth: January 02, 1931  [facility]) CM/SW Contact:  Eileen Stanford, LCSW Phone Number: 08/13/2019, 10:45 AM   Clinical Narrative:  Pt will d/c back to ILF. Pt was already getting [place] services through [facility] at facility. CSW spoke with faclity and they will arrange this. Pt declines any equipment. Pt's spouse will transport pt back to ILF.        Barriers to Discharge: No Barriers Identified   Patient Goals and CMS Choice Patient states their goals for this hospitalization and ongoing recovery are:: to go home      Discharge Placement              Patient chooses bed at: [facility] [place] [place]) Patient to be transferred to facility by: Spouse Name of family member notified: Dorothy Patient and family notified of of transfer: 08/13/19  Discharge Plan and Services In-house Referral: Clinical Social Work   Post Acute Care Choice: Home Health                    HH Arranged: PT [facility] Agency: [place] Date [place]: 08/13/19 Time [place]: 29 Representative spoke with at [place]: [place] Determinants of Health ([place]) Interventions     Readmission Risk Interventions No flowsheet data found.

## 2019-08-13 NOTE — Discharge Summary (Signed)
Physician Discharge Summary   AUNER DETTLING Sr.  male DOB: 84-Oct-1932  [id number]  PCP: Rusty Aus, MD  Admit date: 08/10/2019 Discharge date: 08/13/2019  Admitted From: ILF Disposition:  ILF.  Daughter updated on the phone about discharge plan.  Home Health: Yes CODE STATUS: Full code  Discharge Instructions    Diet - low sodium heart healthy   Complete by: As directed    Discharge instructions   Complete by: As directed    Neurology wants you to just take Plavix 75 mg daily, but NO aspirin.  I have increased your Lipitor to 20 mg daily (up from 10) to better manage your cholesterol.  Dr. Enzo Bi - -   Increase activity slowly   Complete by: As directed        Hospital Course:  For full details, please see H&P, progress notes, consult notes and ancillary notes.  Briefly,  ERMON RUTMAN Jonesis a 86 y.o.Caucasian malewith medical history significant forParkinson's disease, remote history of CVA without residual deficit and on Plavix, CKD 3, hypothyroidism, hypertension and mild dementia who was brought to the emergency room because of a concern for right arm weakness that was noticed by his caregiver and his wife the morning of presentation.   Acute CVA (cerebrovascular accident) [facility]) history of prior CVA MRI brain showed "Punctate acute/early subacute infarct within the left parietal cortex versus image noise artifact. Additionally, there is suspicion for a 2 mm subacute infarct within the left frontal lobe white matter."  Carotid US showed 50%-69% stenosis on the left.  TTE showed no shunt. LDL 85.  Neurology consulted and deemed the small L parietal stroke is likely in setting of small vessel disease, therefore, no need for dual-anti-plt therapy.  Pt was on plavix at home previously, and should continue, and no need to add ASA 81.  Home Lipitor was increased to 20 mg daily (up from 10).  By the time of discharge, pt's UE strength seemed to have improved,  equal and 5/5.    HTN (hypertension) Continued home losartan   Acquired hypothyroidism Continued home levothyroxine  Parkinson's disease ([place]) Continued carbidopa levodopa  Dementia, likely vascular Continued Aricept. Continued Cymbalta and Zoloft    Discharge Diagnoses:  Principal Problem:   Acute CVA (cerebrovascular accident) [facility]) Active Problems:   HTN (hypertension)   Acquired hypothyroidism   Parkinson's disease ([place])   History of CVA (cerebrovascular accident)   Stroke [facility])    Discharge Instructions:  Allergies as of 08/13/2019   No Known Allergies     Medication List    TAKE these medications   atorvastatin 10 MG tablet Commonly known as: LIPITOR Take 2 tablets (20 mg total) by mouth daily. This is an increase from 10 mg daily. What changed:   how much to take  additional instructions   carbidopa-levodopa 25-250 MG tablet Commonly known as: SINEMET IR Take 1 tablet by mouth 3 (three) times daily.   carbidopa-levodopa 50-200 MG tablet Commonly known as: SINEMET CR Take 1 tablet by mouth at bedtime.   clopidogrel 75 MG tablet Commonly known as: PLAVIX Take 75 mg by mouth daily.   donepezil 5 MG tablet Commonly known as: ARICEPT Take 5 mg by mouth at bedtime.   DULoxetine 30 MG capsule Commonly known as: CYMBALTA Take 30 mg by mouth daily.   levothyroxine 75 MCG tablet Commonly known as: SYNTHROID Take 75 mcg by mouth daily.   losartan 25 MG tablet Commonly known as: COZAAR Take  25 mg by mouth daily.   magnesium oxide 400 MG tablet Commonly known as: MAG-OX Take 400 mg by mouth.   Multi-Vitamin tablet Take 1 tablet by mouth daily.   omeprazole 40 MG capsule Commonly known as: PRILOSEC Take 40 mg by mouth daily.   sertraline 50 MG tablet Commonly known as: ZOLOFT Take 50 mg by mouth daily.   Tylenol 325 MG Caps Generic drug: Acetaminophen Take 325 mg by mouth daily as needed.   vitamin B-12 500 MCG  tablet Commonly known as: CYANOCOBALAMIN Take 500 mcg by mouth daily.   Vitamin D3 50 MCG (2000 UT) capsule Take 1 capsule by mouth daily.     ASK your doctor about these medications    stroke: mapping our early stages of recovery book Misc 1 each by Does not apply route once for 1 dose. Ask about: Should I take this medication?       Follow-up Information    Rusty Aus, MD. Schedule an appointment as soon as possible for a visit in 1 week(s).   Specialty: Internal Medicine Contact information: [place] [address] [phone]           No Known Allergies   The results of significant diagnostics from this hospitalization (including imaging, microbiology, ancillary and laboratory) are listed below for reference.   Consultations:   Procedures/Studies: CT HEAD WO CONTRAST  Result Date: 08/10/2019 CLINICAL DATA:  FIRST NURSE NOTE- here for right side weakness upon waking this morning. Hx of stroke. LKW 08/09/2019 at 2100. Wife reports difficulty using right hand. Equal grip strength. No numbness or tingling EXAM: CT HEAD WITHOUT CONTRAST TECHNIQUE: Contiguous axial images were obtained from the base of the skull through the vertex without intravenous contrast. COMPARISON:  04/15/2018 FINDINGS: Brain: No evidence of acute infarction, hemorrhage, hydrocephalus, extra-axial collection or mass lesion/mass effect. There is ventricular greater than sulcal enlargement, reflecting atrophy, unchanged from the prior head CT. Mild periventricular white matter hypoattenuation is also noted consistent with chronic microvascular ischemic change, also stable. Vascular: No hyperdense vessel or unexpected calcification. Skull: Normal. Negative for fracture or focal lesion. Sinuses/Orbits: Visualized globes and orbits are unremarkable. Visualized sinuses are clear. Other: None. IMPRESSION: 1. No acute intracranial abnormalities. 2. Atrophy and mild chronic microvascular  ischemic change stable from the prior head CT. Electronically Signed   By: Lajean Manes M.D.   On: 08/10/2019 16:00   MR BRAIN WO CONTRAST  Result Date: 08/10/2019 CLINICAL DATA:  Right arm weakness. Additional history provided: Patient's wife reports he has been weak today, seems to be weaker on the right side. EXAM: MRI HEAD WITHOUT CONTRAST TECHNIQUE: Multiplanar, multiecho pulse sequences of the brain and surrounding structures were obtained without intravenous contrast. COMPARISON:  Head CT 08/10/2019, MRI/MRA head 12/21/2015 FINDINGS: Brain: The examination is motion degraded, limiting evaluation. Most notably, there is moderate motion degradation of the axial T2 weighted sequence, moderate motion degradation of the axial SWI sequence, moderate/severe motion degradation of the axial T2 FLAIR sequence and moderate/severe motion degradation of the axial T1 weighted sequence. Diffusion-weighted imaging is mildly motion degraded. There is a punctate focus of diffusion weighted hyperintensity within the left parietal lobe cortex (series 7, image 14). A punctate acute/early subacute infarct is difficult to exclude versus artifact. There is an additional 2 mm vague focus of diffusion weighted hyperintensity within the left frontal lobe white matter which demonstrates corresponding intermediate signal on the ADC map. This finding is suspicious for a tiny subacute infarct.  Redemonstrated small now chronic cortical infarcts within the left frontoparietal lobes, and chronic lacunar infarct within the left caudate head. No evidence of intracranial mass. No midline shift or extra-axial fluid collection. Redemonstrated small focus of chronic blood products along or within the left parietal lobe (series 13, image 35). Mild patchy and confluent T2/FLAIR hyperintensity within the cerebral white matter is nonspecific, but consistent with chronic small vessel ischemic disease. Moderate generalized parenchymal atrophy,  progressed since MRI 12/21/2015. Vascular: Flow voids maintained within the proximal large arterial vessels. Skull and upper cervical spine: No focal marrow lesion. Sinuses/Orbits: Visualized orbits demonstrate no acute abnormality. Right maxillary sinus mucous retention cyst. Bilateral mastoid effusions (greater on the left). IMPRESSION: Motion degraded and limited examination as described. Mildly motion degraded diffusion-weighted imaging. Punctate acute/early subacute infarct within the left parietal cortex versus image noise artifact. Additionally, there is suspicion for a 2 mm subacute infarct within the left frontal lobe white matter. Redemonstrated chronic cortical infarcts within the left frontoparietal lobes and chronic lacunar infarct within the left caudate head. Mild chronic small vessel ischemic disease. Moderate generalized parenchymal atrophy, progressed from MRI 12/21/2015. Bilateral mastoid effusions. Electronically Signed   By: Kellie Simmering DO   On: 08/10/2019 21:53   US Carotid Bilateral (at [facility] and [facility] only)  Result Date: 08/11/2019 CLINICAL DATA:  86 year old male with a history stroke EXAM: BILATERAL CAROTID DUPLEX ULTRASOUND TECHNIQUE: Pearline Cables scale imaging, color Doppler and duplex ultrasound were performed of bilateral carotid and vertebral arteries in the neck. COMPARISON:  None. FINDINGS: Criteria: Quantification of carotid stenosis is based on velocity parameters that correlate the residual internal carotid diameter with NASCET-based stenosis levels, using the diameter of the distal internal carotid lumen as the denominator for stenosis measurement. The following velocity measurements were obtained: RIGHT ICA:  Systolic 88 cm/sec, Diastolic 17 cm/sec CCA:  77 cm/sec SYSTOLIC ICA/CCA RATIO:  1.1 ECA:  117 cm/sec LEFT ICA:  Systolic [id number] cm/sec, Diastolic 26 cm/sec CCA:  59 cm/sec SYSTOLIC ICA/CCA RATIO:  2.2 ECA:  84 cm/sec Right Brachial SBP: Not acquired Left Brachial SBP: Not acquired  RIGHT CAROTID ARTERY: No significant calcifications of the right common carotid artery. Intermediate waveform maintained. Heterogeneous and partially calcified plaque at the right carotid bifurcation. No significant lumen shadowing. Low resistance waveform of the right ICA. No significant tortuosity. RIGHT VERTEBRAL ARTERY: Antegrade flow with low resistance waveform. LEFT CAROTID ARTERY: No significant calcifications of the left common carotid artery. Intermediate waveform maintained. Heterogeneous and partially calcified plaque at the left carotid bifurcation without significant lumen shadowing. Low resistance waveform of the left ICA. No significant tortuosity. LEFT VERTEBRAL ARTERY:  Antegrade flow with low resistance waveform. IMPRESSION: Right: Color duplex indicates minimal heterogeneous and calcified plaque, with no hemodynamically significant stenosis by duplex criteria in the extracranial cerebrovascular circulation. Left: Heterogeneous and partially calcified plaque at the left carotid bifurcation contributes to 50%-69% stenosis by established duplex criteria. Signed, Dulcy Fanny. Dellia Nims, RPVI [facility] Electronically Signed   By: Corrie Mckusick D.O.   On: 08/11/2019 10:18   ECHOCARDIOGRAM COMPLETE  Result Date: 08/12/2019   ECHOCARDIOGRAM REPORT   Patient Name:   Fred VAILLANT Sr. Date of Exam: 08/11/2019 Medical Rec #:  [id number]          Height:       67.0 in Accession #:    [id number]         Weight:       160.0 lb Date of  Birth:  01/18/1931          BSA:          1.84 m Patient Age:    86 years           BP:           123/68 mmHg Patient Gender: M                  HR:           48 bpm. Exam Location:  [facility] Procedure: 2D Echo Indications:     STROKE 434.91/I163.9  History:         Patient has prior history of Echocardiogram examinations, most                  recent 12/21/2015. Stroke; Risk Factors:Hypertension.  Sonographer:     Avanell Shackleton Referring Phys:  [id number] Athena Masse Diagnosing Phys: Yolonda Kida MD IMPRESSIONS  1. Left ventricular ejection fraction, by visual estimation, is 65 to 70%. The left ventricle has normal function. Left ventricular septal wall thickness was normal. Normal left ventricular posterior wall thickness. There is mildly increased left ventricular hypertrophy.  2. Left ventricular diastolic parameters are consistent with Grade I diastolic dysfunction (impaired relaxation).  3. The left ventricle has no regional wall motion abnormalities.  4. Global right ventricle has normal systolic function.The right ventricular size is normal. No increase in right ventricular wall thickness.  5. Left atrial size was normal.  6. Right atrial size was normal.  7. The mitral valve is normal in structure. Trivial mitral valve regurgitation.  8. The tricuspid valve is normal in structure.  9. The tricuspid valve is normal in structure. Tricuspid valve regurgitation is mild. 10. The aortic valve is normal in structure. Aortic valve regurgitation is not visualized. 11. The pulmonic valve was grossly normal. Pulmonic valve regurgitation is not visualized. 12. Mildly elevated pulmonary artery systolic pressure. FINDINGS  Left Ventricle: Left ventricular ejection fraction, by visual estimation, is 65 to 70%. The left ventricle has normal function. The left ventricle has no regional wall motion abnormalities. The left ventricular internal cavity size was the left ventricle is normal in size. Normal left ventricular posterior wall thickness. There is mildly increased left ventricular hypertrophy. Left ventricular diastolic parameters are consistent with Grade I diastolic dysfunction (impaired relaxation). Right Ventricle: The right ventricular size is normal. No increase in right ventricular wall thickness. Global RV systolic function is has normal systolic function. The tricuspid regurgitant velocity is 2.55 m/s, and with an assumed  right atrial pressure  of 10 mmHg, the estimated right ventricular systolic pressure is mildly elevated at 36.0 mmHg. Left Atrium: Left atrial size was normal in size. Right Atrium: Right atrial size was normal in size Pericardium: There is no evidence of pericardial effusion. Mitral Valve: The mitral valve is normal in structure. Trivial mitral valve regurgitation. Tricuspid Valve: The tricuspid valve is normal in structure. Tricuspid valve regurgitation is mild. Aortic Valve: The aortic valve is normal in structure. Aortic valve regurgitation is not visualized. Pulmonic Valve: The pulmonic valve was grossly normal. Pulmonic valve regurgitation is not visualized. Pulmonic regurgitation is not visualized. Aorta: The aortic root is normal in size and structure. IAS/Shunts: No atrial level shunt detected by color flow Doppler.  LEFT VENTRICLE PLAX 2D LVIDd:         4.17 cm  Diastology LVIDs:         2.51 cm  LV e' lateral:  5.44 cm/s LV PW:         0.78 cm  LV E/e' lateral: 11.8 LV IVS:        0.97 cm  LV e' medial:    5.11 cm/s LVOT diam:     2.00 cm  LV E/e' medial:  12.6 LV SV:         55 ml LV SV Index:   29.38 LVOT Area:     3.14 cm  IVC IVC diam: 2.02 cm LEFT ATRIUM             Index       RIGHT ATRIUM           Index LA diam:        3.45 cm 1.88 cm/m  RA Area:     10.00 cm LA Vol (A2C):   58.3 ml 31.70 ml/m RA Volume:   17.90 ml  9.73 ml/m LA Vol (A4C):   34.5 ml 18.76 ml/m LA Biplane Vol: 47.4 ml 25.77 ml/m   AORTA Ao Root diam: 3.80 cm MITRAL VALVE                        TRICUSPID VALVE MV Area (PHT): 2.50 cm             TR Peak grad:   26.0 mmHg MV PHT:        87.87 msec           TR Vmax:        255.00 cm/s MV Decel Time: 303 msec MV E velocity: 64.30 cm/s 103 cm/s  SHUNTS MV A velocity: 74.70 cm/s 70.3 cm/s Systemic Diam: 2.00 cm MV E/A ratio:  0.86       1.5  Dwayne D Callwood MD Electronically signed by Yolonda Kida MD Signature Date/Time: 08/12/2019/9:54:32 AM    Final       Labs: BNP  (last 3 results) No results for input(s): BNP in the last 8760 hours. Basic Metabolic Panel: Recent Labs  Lab 08/11/19 0520 08/12/19 0532 08/13/19 0440  NA 140 139 142  K 4.0 4.2 4.0  CL 105 104 107  CO2 29 31 30   GLUCOSE 105* 111* 107*  BUN 20 24* 22  CREATININE 1.42* 1.30* 1.24  CALCIUM 9.7 9.8 10.0  MG 2.2 1.9 1.9   Liver Function Tests: No results for input(s): AST, ALT, ALKPHOS, BILITOT, PROT, ALBUMIN in the last 168 hours. No results for input(s): LIPASE, AMYLASE in the last 168 hours. No results for input(s): AMMONIA in the last 168 hours. CBC: Recent Labs  Lab 08/11/19 0520 08/12/19 0532 08/13/19 0440  WBC 11.0* 12.3* 13.0*  HGB 12.9* 13.5 14.1  HCT 39.2 41.3 42.0  MCV 97.0 95.8 94.6  PLT 261 236 254   Cardiac Enzymes: No results for input(s): CKTOTAL, CKMB, CKMBINDEX, TROPONINI in the last 168 hours. BNP: Invalid input(s): POCBNP CBG: No results for input(s): GLUCAP in the last 168 hours. D-Dimer No results for input(s): DDIMER in the last 72 hours. Hgb A1c No results for input(s): HGBA1C in the last 72 hours. Lipid Profile No results for input(s): CHOL, HDL, LDLCALC, TRIG, CHOLHDL, LDLDIRECT in the last 72 hours. Thyroid function studies No results for input(s): TSH, T4TOTAL, T3FREE, THYROIDAB in the last 72 hours.  Invalid input(s): FREET3 Anemia work up No results for input(s): VITAMINB12, FOLATE, FERRITIN, TIBC, IRON, RETICCTPCT in the last 72 hours. Urinalysis    Component Value Date/Time   COLORURINE  YELLOW (A) 06/08/2018 0915   APPEARANCEUR CLOUDY (A) 06/08/2018 0915   APPEARANCEUR Clear 06/12/2014 2255   LABSPEC 1.010 06/08/2018 0915   LABSPEC 1.005 06/12/2014 2255   PHURINE 8.0 06/08/2018 0915   GLUCOSEU NEGATIVE 06/08/2018 0915   GLUCOSEU Negative 06/12/2014 2255   HGBUR NEGATIVE 06/08/2018 0915   BILIRUBINUR NEGATIVE 06/08/2018 0915   BILIRUBINUR Negative 06/12/2014 2255   KETONESUR NEGATIVE 06/08/2018 0915   PROTEINUR NEGATIVE  06/08/2018 0915   NITRITE NEGATIVE 06/08/2018 0915   LEUKOCYTESUR NEGATIVE 06/08/2018 0915   LEUKOCYTESUR Negative 06/12/2014 2255   Sepsis Labs Invalid input(s): PROCALCITONIN,  WBC,  LACTICIDVEN Microbiology Recent Results (from the past 240 hour(s))  SARS CORONAVIRUS 2 (TAT 6-24 HRS) Nasopharyngeal Nasopharyngeal Swab     Status: None   Collection Time: 08/10/19 11:15 PM   Specimen: Nasopharyngeal Swab  Result Value Ref Range Status   SARS Coronavirus 2 NEGATIVE NEGATIVE Final    Comment: (NOTE) SARS-CoV-2 target nucleic acids are NOT DETECTED. The SARS-CoV-2 RNA is generally detectable in upper and lower respiratory specimens during the acute phase of infection. Negative results do not preclude SARS-CoV-2 infection, do not rule out co-infections with other pathogens, and should not be used as the sole basis for treatment or other patient management decisions. Negative results must be combined with clinical observations, patient history, and epidemiological information. The expected result is Negative. Fact Sheet for Patients: SugarRoll.be Fact Sheet for Healthcare Providers: https://www.woods-mathews.com/ This test is not yet approved or cleared by the [place] FDA and  has been authorized for detection and/or diagnosis of SARS-CoV-2 by FDA under an Emergency Use Authorization (EUA). This EUA will remain  in effect (meaning this test can be used) for the duration of the COVID-19 declaration under Section 56 4(b)(1) of the Act, 21 U.S.C. section 360bbb-3(b)(1), unless the authorization is terminated or revoked sooner. Performed at [facility], [place], [place], [place] [address]      Total time spend on discharging this patient, including the last patient exam, discussing the hospital stay, instructions for ongoing care as it relates to all pertinent caregivers, as well as preparing the medical discharge records,  prescriptions, and/or referrals as applicable, is 30 minutes.    Enzo Bi, MD  [facility] 08/18/2019, 3:08 AM  If 7PM-7AM, please contact night-coverage

## 2019-08-15 DIAGNOSIS — I63422 Cerebral infarction due to embolism of left anterior cerebral artery: Secondary | ICD-10-CM | POA: Diagnosis not present

## 2019-08-15 DIAGNOSIS — I639 Cerebral infarction, unspecified: Secondary | ICD-10-CM | POA: Diagnosis not present

## 2019-08-20 DIAGNOSIS — Z9181 History of falling: Secondary | ICD-10-CM | POA: Diagnosis not present

## 2019-08-20 DIAGNOSIS — I639 Cerebral infarction, unspecified: Secondary | ICD-10-CM | POA: Diagnosis not present

## 2019-08-20 DIAGNOSIS — M6281 Muscle weakness (generalized): Secondary | ICD-10-CM | POA: Diagnosis not present

## 2019-08-20 DIAGNOSIS — R2689 Other abnormalities of gait and mobility: Secondary | ICD-10-CM | POA: Diagnosis not present

## 2019-08-20 DIAGNOSIS — G20 Parkinson's disease: Secondary | ICD-10-CM | POA: Diagnosis not present

## 2019-08-27 DIAGNOSIS — G20 Parkinson's disease: Secondary | ICD-10-CM | POA: Diagnosis not present

## 2019-08-27 DIAGNOSIS — R2689 Other abnormalities of gait and mobility: Secondary | ICD-10-CM | POA: Diagnosis not present

## 2019-08-27 DIAGNOSIS — M6281 Muscle weakness (generalized): Secondary | ICD-10-CM | POA: Diagnosis not present

## 2019-08-27 DIAGNOSIS — I639 Cerebral infarction, unspecified: Secondary | ICD-10-CM | POA: Diagnosis not present

## 2019-08-27 DIAGNOSIS — Z9181 History of falling: Secondary | ICD-10-CM | POA: Diagnosis not present

## 2019-09-03 DIAGNOSIS — Z9181 History of falling: Secondary | ICD-10-CM | POA: Diagnosis not present

## 2019-09-03 DIAGNOSIS — G4733 Obstructive sleep apnea (adult) (pediatric): Secondary | ICD-10-CM | POA: Diagnosis not present

## 2019-09-03 DIAGNOSIS — G20 Parkinson's disease: Secondary | ICD-10-CM | POA: Diagnosis not present

## 2019-09-03 DIAGNOSIS — M6281 Muscle weakness (generalized): Secondary | ICD-10-CM | POA: Diagnosis not present

## 2019-09-03 DIAGNOSIS — R2689 Other abnormalities of gait and mobility: Secondary | ICD-10-CM | POA: Diagnosis not present

## 2019-09-03 DIAGNOSIS — I639 Cerebral infarction, unspecified: Secondary | ICD-10-CM | POA: Diagnosis not present

## 2019-09-07 DIAGNOSIS — I10 Essential (primary) hypertension: Secondary | ICD-10-CM | POA: Diagnosis not present

## 2019-09-10 DIAGNOSIS — E782 Mixed hyperlipidemia: Secondary | ICD-10-CM | POA: Diagnosis not present

## 2019-09-11 DIAGNOSIS — E782 Mixed hyperlipidemia: Secondary | ICD-10-CM | POA: Diagnosis not present

## 2019-09-17 DIAGNOSIS — G20 Parkinson's disease: Secondary | ICD-10-CM | POA: Diagnosis not present

## 2019-09-17 DIAGNOSIS — E538 Deficiency of other specified B group vitamins: Secondary | ICD-10-CM | POA: Diagnosis not present

## 2019-09-17 DIAGNOSIS — Z0000 Encounter for general adult medical examination without abnormal findings: Secondary | ICD-10-CM | POA: Diagnosis not present

## 2019-09-17 DIAGNOSIS — I63422 Cerebral infarction due to embolism of left anterior cerebral artery: Secondary | ICD-10-CM | POA: Diagnosis not present

## 2019-09-17 DIAGNOSIS — D72820 Lymphocytosis (symptomatic): Secondary | ICD-10-CM | POA: Diagnosis not present

## 2019-09-17 DIAGNOSIS — E782 Mixed hyperlipidemia: Secondary | ICD-10-CM | POA: Diagnosis not present

## 2019-09-17 DIAGNOSIS — I69353 Hemiplegia and hemiparesis following cerebral infarction affecting right non-dominant side: Secondary | ICD-10-CM | POA: Diagnosis not present

## 2019-09-24 DIAGNOSIS — G20 Parkinson's disease: Secondary | ICD-10-CM | POA: Diagnosis not present

## 2019-09-24 DIAGNOSIS — I639 Cerebral infarction, unspecified: Secondary | ICD-10-CM | POA: Diagnosis not present

## 2019-09-24 DIAGNOSIS — Z9181 History of falling: Secondary | ICD-10-CM | POA: Diagnosis not present

## 2019-09-24 DIAGNOSIS — M6281 Muscle weakness (generalized): Secondary | ICD-10-CM | POA: Diagnosis not present

## 2019-09-24 DIAGNOSIS — R2689 Other abnormalities of gait and mobility: Secondary | ICD-10-CM | POA: Diagnosis not present

## 2019-10-01 DIAGNOSIS — I639 Cerebral infarction, unspecified: Secondary | ICD-10-CM | POA: Diagnosis not present

## 2019-10-01 DIAGNOSIS — G4733 Obstructive sleep apnea (adult) (pediatric): Secondary | ICD-10-CM | POA: Diagnosis not present

## 2019-10-01 DIAGNOSIS — G20 Parkinson's disease: Secondary | ICD-10-CM | POA: Diagnosis not present

## 2019-10-10 DIAGNOSIS — G20 Parkinson's disease: Secondary | ICD-10-CM | POA: Diagnosis not present

## 2019-10-10 DIAGNOSIS — I63422 Cerebral infarction due to embolism of left anterior cerebral artery: Secondary | ICD-10-CM | POA: Diagnosis not present

## 2019-10-10 DIAGNOSIS — R2689 Other abnormalities of gait and mobility: Secondary | ICD-10-CM | POA: Diagnosis not present

## 2019-10-10 DIAGNOSIS — G8103 Flaccid hemiplegia affecting right nondominant side: Secondary | ICD-10-CM | POA: Diagnosis not present

## 2019-10-10 DIAGNOSIS — N3090 Cystitis, unspecified without hematuria: Secondary | ICD-10-CM | POA: Diagnosis not present

## 2019-10-11 DIAGNOSIS — G8103 Flaccid hemiplegia affecting right nondominant side: Secondary | ICD-10-CM | POA: Diagnosis not present

## 2019-10-11 DIAGNOSIS — R2689 Other abnormalities of gait and mobility: Secondary | ICD-10-CM | POA: Diagnosis not present

## 2019-10-11 DIAGNOSIS — I63422 Cerebral infarction due to embolism of left anterior cerebral artery: Secondary | ICD-10-CM | POA: Diagnosis not present

## 2019-10-11 DIAGNOSIS — N3090 Cystitis, unspecified without hematuria: Secondary | ICD-10-CM | POA: Diagnosis not present

## 2019-10-11 DIAGNOSIS — G20 Parkinson's disease: Secondary | ICD-10-CM | POA: Diagnosis not present

## 2019-10-15 DIAGNOSIS — M6281 Muscle weakness (generalized): Secondary | ICD-10-CM | POA: Diagnosis not present

## 2019-10-15 DIAGNOSIS — R2689 Other abnormalities of gait and mobility: Secondary | ICD-10-CM | POA: Diagnosis not present

## 2019-10-15 DIAGNOSIS — Z9181 History of falling: Secondary | ICD-10-CM | POA: Diagnosis not present

## 2019-10-15 DIAGNOSIS — G20 Parkinson's disease: Secondary | ICD-10-CM | POA: Diagnosis not present

## 2019-10-15 DIAGNOSIS — I639 Cerebral infarction, unspecified: Secondary | ICD-10-CM | POA: Diagnosis not present

## 2019-11-01 DIAGNOSIS — G4733 Obstructive sleep apnea (adult) (pediatric): Secondary | ICD-10-CM | POA: Diagnosis not present

## 2019-11-01 DIAGNOSIS — G20 Parkinson's disease: Secondary | ICD-10-CM | POA: Diagnosis not present

## 2019-11-01 DIAGNOSIS — I639 Cerebral infarction, unspecified: Secondary | ICD-10-CM | POA: Diagnosis not present

## 2019-11-06 ENCOUNTER — Telehealth: Payer: Self-pay | Admitting: Adult Health Nurse Practitioner

## 2019-11-06 NOTE — Telephone Encounter (Signed)
Spoke with patient's wife Earlie Server regarding Palliative services and all questions were answered and she was in agreement with this.  I have scheduled an In-person Consult for 11/08/19 @ 2 PM.

## 2019-11-06 NOTE — Telephone Encounter (Signed)
Rec'd call from patient's wife Earlie Server needing to reschedule the Palliative Consult due to another appointment, this was rescheduled for 11/12/19 @ 11:30 AM.

## 2019-11-08 ENCOUNTER — Other Ambulatory Visit: Payer: Self-pay | Admitting: Adult Health Nurse Practitioner

## 2019-11-12 ENCOUNTER — Other Ambulatory Visit: Payer: Self-pay

## 2019-11-12 ENCOUNTER — Other Ambulatory Visit: Payer: PPO | Admitting: Adult Health Nurse Practitioner

## 2019-11-12 DIAGNOSIS — G20 Parkinson's disease: Secondary | ICD-10-CM

## 2019-11-12 DIAGNOSIS — Z515 Encounter for palliative care: Secondary | ICD-10-CM

## 2019-11-12 NOTE — Progress Notes (Signed)
[place] Consult Note Telephone: [phone]  Fax: [phone]  PATIENT NAME: Fred FUSELIER Sr. DOB: 1931-02-25 MRN: [id number]  PRIMARY CARE PROVIDER:   Rusty Aus, MD  REFERRING PROVIDER:  Rusty Aus, MD [address] [place],  [place] [address]  RESPONSIBLE PARTY:   Wife, Fred Jones [phone]  C: [phone]    RECOMMENDATIONS and PLAN:  1.  Advanced care planning.  Patient is a full code.  Ran out of time to discuss ACP today.  Will discuss at future visit.  Discussed palliative services and answered patient and families questions.  2.  CVA.  Patient was in hospital 1/22-1/25/21 post CVA.Patient was started on Plavix 75 mg and his lipitor was increased to 20mg  daily. Months prior to CVA patient was still able to work and was using a walker to get around.  Now he is unable to stand by himself and requires a wheelchair to get around.  Family does have caregivers in the home and caregiver states that it can sometimes be difficult to get him out of bed in the morning and is inquiring about a lift.  States that he is able to stand once they get him up, especially in the shower when he can hold onto shower bars.  He requires help with ADLs.  He is able to feed himself.  Family and caregiver state that he sleeps more.  At times will sleep all night and will be found sleeping several times throughout the day.  Though some nights he will get up in the middle night and it will several hours before he is able to fall back to sleep.  He does not eat much at meals but he is eating enough to maintain his weight at 168 pounds. Patient is able to reposition himself in his chair. He is able to move his legs while sitting and have encouraged chair exercises.  Patient has had some episodes of nearly falling out of his wheelchair trying to reach for something.  He states that he would like to be able to use his  hands better again.  He is using stress balls to help strengthen his hands. Caregiver does state that he leans to the right side.  Recommend  PT/OT to evaluate for lift and to help with dexterity.  Will reach out to PCP with this recommendation.  3.  Depression.  Patient has long standing depression and has been on sertraline 50 mg daily and cymbalta 30 mg for a long time.  He does endorse having increased depression after the CVA and sleeping disturbance, see above, may be in part due to increased depression.  Will reach out to PCP for possible adjustment to his depression medications.    4. Constipation.  Patient has been taking benefiber for daily.  Last week started having some constipation last week and started taking stool softener and Miralax daily.  Has been having a BM every 2-3 days.  Have encouraged to use milk of magnesia PRN if have not a BM in 3 days.    5.  Parkinson's disease.  Patient is no sinemet 25/250 mg TID and sinemet CR 50/200 mg QHS. Denies any increased tremors or freezing episodes.  Family and caregiver do state that he episodes when his movement seems slowed. He denies pain. He does state that he gets dizziness with position changes and that it helps when he changes positions slowly and will wait  a minute before changing to a new position.  Continue slowly changing positions. He is being seen by neurology.  Continue follow up and recommendations by neurology  Patient denies any increased SOB or cough, fever, N/V/D, chest pain, dysuria, hematuria, hematochezia.  Has not had any falls or infections.  Palliative will continue to monitor for symptom management/decline and to address ACP.  Will follow up in 4-6 weeks.  I spent 140 minutes providing this consultation,  from 11:00 to 1:20 including time spent with patient/family, chart review, provider coordination, documentation. More than 50% of the time in this consultation was spent coordinating communication.   HISTORY OF  PRESENT ILLNESS:  Fred BEACHAM Sr. is a 86 y.o. year old male with multiple medical problems including Parkinson's disease, mild dementia, CKD stage 3, HTN, hypothyroidsim. Palliative Care was asked to help address goals of care.   CODE STATUS: full code  PPS: 30% HOSPICE ELIGIBILITY/DIAGNOSIS: TBD  PHYSICAL EXAM:  BP 112/52  HR 51  O2 97% on RA General: NAD, frail appearing Cardiovascular: regular rate and rhythm Pulmonary: lung sounds clear; normal respiratory effort Abdomen: soft, nontender, + bowel sounds GU: no suprapubic tenderness Extremities: no edema, no joint deformities Skin: no rashes on exposed skin Neurological: Weakness but otherwise nonfocal  PAST MEDICAL HISTORY:  Past Medical History:  Diagnosis Date  . Arthritis    fingers, neck  . Benign neoplasm of colon, unspecified   . Calculus of kidney   . Cancer of prostate [facility]) 12/21/2013   3/07 resection  . Cancer of skin   . Cerebrovascular accident (CVA) due to embolism of left anterior cerebral artery ([place]) 12/25/2015   Carotid negative, echo negative, right hand weakness, 6/17  . Chronic kidney disease, stage III (moderate)   . Dementia ([place])   . Depression    unspecified  . Ganglion of tendon sheath   . GERD (gastroesophageal reflux disease)    rare  . History of urethral stricture    postoperative urethral stricture  . Hyperlipidemia, unspecified 12/21/2013  . Hypertension 12/31/2013   Echo s/p fall 11/15  . Kidney stone 12/21/2013  . Lumbar disc disease 12/21/2013  . Malignant neoplasm of prostate ([place])    08/2005 Gleason's  7 (3 4) left base, left mid medial, and left mid lateral. PSA 4.6 with a percent free PSA  . Melanoma ([facility])    Stage I, s/p resection 12/2007  . Microscopic hematuria   . Nonspecific finding on examination of urine   . Other hammer toe(s) (acquired), unspecified foot   . Stress incontinence, male   . Stroke ([place])   . Unilateral inguinal hernia, without obstruction or  gangrene, not specified as recurrent    unilateral or unspecified  . Vascular disorder of penis     SOCIAL HX:  Social History   Tobacco Use  . Smoking status: Never Smoker  . Smokeless tobacco: Never Used  Substance Use Topics  . Alcohol use: Yes    Alcohol/week: 4.0 standard drinks    Types: 4 Glasses of wine per week    Comment: occasionally    ALLERGIES: No Known Allergies   PERTINENT MEDICATIONS:  Outpatient Encounter Medications as of 11/12/2019  Medication Sig  . Acetaminophen (TYLENOL) 325 MG CAPS Take 325 mg by mouth daily as needed.  Marland Kitchen atorvastatin (LIPITOR) 10 MG tablet Take 2 tablets (20 mg total) by mouth daily. This is an increase from 10 mg daily.  . carbidopa-levodopa (SINEMET CR) 50-200 MG tablet Take 1 tablet  by mouth at bedtime.  . carbidopa-levodopa (SINEMET IR) 25-250 MG tablet Take 1 tablet by mouth 3 (three) times daily.   . Cholecalciferol (VITAMIN D3) 2000 units capsule Take 1 capsule by mouth daily.  . clopidogrel (PLAVIX) 75 MG tablet Take 75 mg by mouth daily.  Marland Kitchen donepezil (ARICEPT) 5 MG tablet Take 5 mg by mouth at bedtime.  . DULoxetine (CYMBALTA) 30 MG capsule Take 30 mg by mouth daily.   Marland Kitchen levothyroxine (SYNTHROID, LEVOTHROID) 75 MCG tablet Take 75 mcg by mouth daily.   Marland Kitchen losartan (COZAAR) 25 MG tablet Take 25 mg by mouth daily.  . magnesium oxide (MAG-OX) 400 MG tablet Take 400 mg by mouth.  . Multiple Vitamin (MULTI-VITAMIN) tablet Take 1 tablet by mouth daily.  Marland Kitchen omeprazole (PRILOSEC) 40 MG capsule Take 40 mg by mouth daily.  . sertraline (ZOLOFT) 50 MG tablet Take 50 mg by mouth daily.  . vitamin B-12 (CYANOCOBALAMIN) 500 MCG tablet Take 500 mcg by mouth daily.   No facility-administered encounter medications on file as of 11/12/2019.      Marvel Mcphillips Jenetta Downer, NP

## 2019-11-30 DIAGNOSIS — G4733 Obstructive sleep apnea (adult) (pediatric): Secondary | ICD-10-CM | POA: Diagnosis not present

## 2019-12-01 DIAGNOSIS — I639 Cerebral infarction, unspecified: Secondary | ICD-10-CM | POA: Diagnosis not present

## 2019-12-01 DIAGNOSIS — G20 Parkinson's disease: Secondary | ICD-10-CM | POA: Diagnosis not present

## 2019-12-01 DIAGNOSIS — G4733 Obstructive sleep apnea (adult) (pediatric): Secondary | ICD-10-CM | POA: Diagnosis not present

## 2019-12-04 DIAGNOSIS — N4822 Cellulitis of corpus cavernosum and penis: Secondary | ICD-10-CM | POA: Diagnosis not present

## 2019-12-04 DIAGNOSIS — G8103 Flaccid hemiplegia affecting right nondominant side: Secondary | ICD-10-CM | POA: Diagnosis not present

## 2019-12-04 DIAGNOSIS — G20 Parkinson's disease: Secondary | ICD-10-CM | POA: Diagnosis not present

## 2019-12-27 DIAGNOSIS — Z79899 Other long term (current) drug therapy: Secondary | ICD-10-CM | POA: Diagnosis not present

## 2019-12-27 DIAGNOSIS — R410 Disorientation, unspecified: Secondary | ICD-10-CM | POA: Diagnosis not present

## 2019-12-27 DIAGNOSIS — R5383 Other fatigue: Secondary | ICD-10-CM | POA: Diagnosis not present

## 2019-12-28 ENCOUNTER — Other Ambulatory Visit: Payer: PPO | Admitting: Adult Health Nurse Practitioner

## 2019-12-28 DIAGNOSIS — Z515 Encounter for palliative care: Secondary | ICD-10-CM | POA: Diagnosis not present

## 2019-12-28 DIAGNOSIS — G20 Parkinson's disease: Secondary | ICD-10-CM

## 2019-12-29 NOTE — Progress Notes (Signed)
[place] Consult Note Telephone: [phone]  Fax: [phone]  PATIENT NAME: Fred Jones Sr. DOB: 1931/05/26 MRN: [id number]  PRIMARY CARE PROVIDER:   Rusty Aus, MD  REFERRING PROVIDER:  Rusty Aus, MD [place],  [place] [address]  RESPONSIBLE PARTY:   Wife, Fred Jones: [phone]  C: [phone]    RECOMMENDATIONS and PLAN:  1.  Advanced care planning.  Patient is a full code.  2.  Possible infection.  Patient has been having increased lethargy for about 2 weeks.  He was seen by PCP yesterday and started on Cipro.  Lab work was unremarkable.  Was unable to get urine sample for testing.  Caregiver and family state that patient has been sleeping more over the past 2 weeks.  He is only been eating 2 meals instead of 3 meals per day.  States that yesterday started having a sore throat and was coughing up thick dark yellow sputum.  Vital signs WNL today.  Has had some increased nasal drainage.  Denies fever, headaches, increased shortness of breath, chest pain, nausea, vomiting, diarrhea, constipation, dysuria, hematuria.  Denies sinus pain or pressure and has no frontal or maxillary sinus tenderness with palpation.  No suprapubic tenderness noted.  Caregiver states that she has not noticed any dark urine or odor to the urine.  Lung sounds clear.  Suspect possible URI with the sore throat and increased cough.  Advised to continue with antibiotic and to increase fluid intake to prevent dehydration.  Went over hospital precautions.  Have appointment in 4 days to reevaluate.  I spent 50 minutes providing this consultation,  from 4:15 to 5:00 including time spent with patient/family, chart review, provider coordination, documentation. More than 50% of the time in this consultation was spent coordinating communication.   HISTORY OF PRESENT ILLNESS:  Fred MARCHENA Sr. is a 85 y.o. year  old male with multiple medical problems including Parkinson's disease, mild dementia, CKD stage 3, HTN, hypothyroidsim. Palliative Care was asked to help address goals of care.   CODE STATUS: full code  PPS: 30% HOSPICE ELIGIBILITY/DIAGNOSIS: TBD  PHYSICAL EXAM:  BP 130/68  HR 59  O2 99% on RA General: NAD, frail appearing, thin Cardiovascular: regular rate and rhythm Pulmonary: lung sounds clear; normal respiratory effort Abdomen: soft, nontender, + bowel sounds GU: no suprapubic tenderness Extremities: no edema, no joint deformities Skin: no rashes on exposed skin.  Does have mild tenting noted to forearm skin Neurological: Weakness but otherwise nonfocal  PAST MEDICAL HISTORY:  Past Medical History:  Diagnosis Date   Arthritis    fingers, neck   Benign neoplasm of colon, unspecified    Calculus of kidney    Cancer of prostate ([place]) 12/21/2013   3/07 resection   Cancer of skin    Cerebrovascular accident (CVA) due to embolism of left anterior cerebral artery ([place]) 12/25/2015   Carotid negative, echo negative, right hand weakness, 6/17   Chronic kidney disease, stage III (moderate)    Dementia ([facility])    Depression    unspecified   Ganglion of tendon sheath    GERD (gastroesophageal reflux disease)    rare   History of urethral stricture    postoperative urethral stricture   Hyperlipidemia, unspecified 12/21/2013   Hypertension 12/31/2013   Echo s/p fall 11/15   Kidney stone 12/21/2013   Lumbar disc disease 12/21/2013   Malignant neoplasm of prostate ([place])  08/2005 Gleason's  7 (3 4) left base, left mid medial, and left mid lateral. PSA 4.6 with a percent free PSA   Melanoma ([place])    Stage I, s/p resection 12/2007   Microscopic hematuria    Nonspecific finding on examination of urine    Other hammer toe(s) (acquired), unspecified foot    Stress incontinence, male    Stroke ([place])    Unilateral inguinal hernia, without obstruction or gangrene,  not specified as recurrent    unilateral or unspecified   Vascular disorder of penis     SOCIAL HX:  Social History   Tobacco Use   Smoking status: Never Smoker   Smokeless tobacco: Never Used  Substance Use Topics   Alcohol use: Yes    Alcohol/week: 4.0 standard drinks    Types: 4 Glasses of wine per week    Comment: occasionally    ALLERGIES: No Known Allergies   PERTINENT MEDICATIONS:  Outpatient Encounter Medications as of 12/28/2019  Medication Sig   Acetaminophen (TYLENOL) 325 MG CAPS Take 325 mg by mouth daily as needed.   carbidopa-levodopa (SINEMET CR) 50-200 MG tablet Take 1 tablet by mouth at bedtime.   carbidopa-levodopa (SINEMET IR) 25-250 MG tablet Take 1 tablet by mouth 3 (three) times daily.    Cholecalciferol (VITAMIN D3) 2000 units capsule Take 1 capsule by mouth daily.   clopidogrel (PLAVIX) 75 MG tablet Take 75 mg by mouth daily.   donepezil (ARICEPT) 5 MG tablet Take 5 mg by mouth at bedtime.   DULoxetine (CYMBALTA) 30 MG capsule Take 30 mg by mouth daily.    levothyroxine (SYNTHROID, LEVOTHROID) 75 MCG tablet Take 75 mcg by mouth daily.    losartan (COZAAR) 25 MG tablet Take 25 mg by mouth daily.   magnesium oxide (MAG-OX) 400 MG tablet Take 400 mg by mouth.   Multiple Vitamin (MULTI-VITAMIN) tablet Take 1 tablet by mouth daily.   omeprazole (PRILOSEC) 40 MG capsule Take 40 mg by mouth daily.   sertraline (ZOLOFT) 50 MG tablet Take 50 mg by mouth daily.   vitamin B-12 (CYANOCOBALAMIN) 500 MCG tablet Take 500 mcg by mouth daily.   No facility-administered encounter medications on file as of 12/28/2019.      [name], NP

## 2019-12-31 ENCOUNTER — Other Ambulatory Visit: Payer: Self-pay

## 2020-01-01 ENCOUNTER — Other Ambulatory Visit: Payer: PPO | Admitting: Adult Health Nurse Practitioner

## 2020-01-01 ENCOUNTER — Other Ambulatory Visit: Payer: Self-pay

## 2020-01-01 DIAGNOSIS — Z515 Encounter for palliative care: Secondary | ICD-10-CM

## 2020-01-01 DIAGNOSIS — I639 Cerebral infarction, unspecified: Secondary | ICD-10-CM | POA: Diagnosis not present

## 2020-01-01 DIAGNOSIS — G20 Parkinson's disease: Secondary | ICD-10-CM

## 2020-01-01 DIAGNOSIS — G4733 Obstructive sleep apnea (adult) (pediatric): Secondary | ICD-10-CM | POA: Diagnosis not present

## 2020-01-01 NOTE — Progress Notes (Signed)
[facility] Consult Note Telephone: [phone]  Fax: [phone]  PATIENT NAME: Fred SCAIFE Sr. DOB: Jan 26, 1931 MRN: [id number]  PRIMARY CARE PROVIDER:   Rusty Aus, MD  REFERRING PROVIDER:  Rusty Aus, MD [address] [place],  [place] [address]  RESPONSIBLE PARTY:   Wife, Glennon Mac: [phone] C: [phone]    RECOMMENDATIONS and PLAN:  1.  Advanced care planning. Patient is a full code.  2.  This is follow-up visit for possible infection last week.  Wife and caregiver state that patient is doing much better since being started on Cipro.  He is not sleeping as much and is starting to eat more.  Denies fever, headaches, increased shortness of breath, cough, sore throat, chest pain, edema, nausea, vomiting, diarrhea, constipation, dysuria.  Denies any changes in urinary habits with no odor or dark urine noted.  Patient states he is feeling better.  Believe patient had possible sinus infection which is resolving.  Discussed with wife and caregiver infection over disease progression with acute onset and it is resolving with antibiotics.  Have next appointment in 2 weeks for follow-up  I spent 40 minutes providing this consultation,  from 3:45 to 4:25 including time spent with patient/family, chart review, provider coordination, documentation. More than 50% of the time in this consultation was spent coordinating communication.   HISTORY OF PRESENT ILLNESS:  Fred CROOKSHANKS Sr. is a 86 y.o. year old male with multiple medical problems including Parkinson's disease, mild dementia, CKD stage 3, HTN, hypothyroidsim. Palliative Care was asked to help address goals of care.   CODE STATUS: full code  PPS: 30% HOSPICE ELIGIBILITY/DIAGNOSIS: TBD  PHYSICAL EXAM:  BP 112/62  HR 50  O2 96% on RA  Temp 97.3 General: NAD, frail appearing, thin Cardiovascular: regular rate and rhythm Pulmonary: lung  sounds clear; normal respiratory effort Abdomen: soft, nontender, + bowel sounds GU: no suprapubic tenderness Extremities: no edema, no joint deformities Skin: no rashes on exposed skin.   Neurological: Weakness but otherwise nonfocal  PAST MEDICAL HISTORY:  Past Medical History:  Diagnosis Date  . Arthritis    fingers, neck  . Benign neoplasm of colon, unspecified   . Calculus of kidney   . Cancer of prostate [facility]) 12/21/2013   3/07 resection  . Cancer of skin   . Cerebrovascular accident (CVA) due to embolism of left anterior cerebral artery ([place]) 12/25/2015   Carotid negative, echo negative, right hand weakness, 6/17  . Chronic kidney disease, stage III (moderate)   . Dementia ([place])   . Depression    unspecified  . Ganglion of tendon sheath   . GERD (gastroesophageal reflux disease)    rare  . History of urethral stricture    postoperative urethral stricture  . Hyperlipidemia, unspecified 12/21/2013  . Hypertension 12/31/2013   Echo s/p fall 11/15  . Kidney stone 12/21/2013  . Lumbar disc disease 12/21/2013  . Malignant neoplasm of prostate ([place])    08/2005 Gleason's  7 (3 4) left base, left mid medial, and left mid lateral. PSA 4.6 with a percent free PSA  . Melanoma ([place])    Stage I, s/p resection 12/2007  . Microscopic hematuria   . Nonspecific finding on examination of urine   . Other hammer toe(s) (acquired), unspecified foot   . Stress incontinence, male   . Stroke ([place])   . Unilateral inguinal hernia, without obstruction or gangrene, not specified as  recurrent    unilateral or unspecified  . Vascular disorder of penis     SOCIAL HX:  Social History   Tobacco Use  . Smoking status: Never Smoker  . Smokeless tobacco: Never Used  Substance Use Topics  . Alcohol use: Yes    Alcohol/week: 4.0 standard drinks    Types: 4 Glasses of wine per week    Comment: occasionally    ALLERGIES: No Known Allergies   PERTINENT MEDICATIONS:  Outpatient Encounter  Medications as of 01/01/2020  Medication Sig  . Acetaminophen (TYLENOL) 325 MG CAPS Take 325 mg by mouth daily as needed.  Marland Kitchen atorvastatin (LIPITOR) 10 MG tablet Take 2 tablets (20 mg total) by mouth daily. This is an increase from 10 mg daily.  . carbidopa-levodopa (SINEMET CR) 50-200 MG tablet Take 1 tablet by mouth at bedtime.  . carbidopa-levodopa (SINEMET IR) 25-250 MG tablet Take 1 tablet by mouth 3 (three) times daily.   . Cholecalciferol (VITAMIN D3) 2000 units capsule Take 1 capsule by mouth daily.  . clopidogrel (PLAVIX) 75 MG tablet Take 75 mg by mouth daily.  Marland Kitchen donepezil (ARICEPT) 5 MG tablet Take 5 mg by mouth at bedtime.  . DULoxetine (CYMBALTA) 30 MG capsule Take 30 mg by mouth daily.   Marland Kitchen levothyroxine (SYNTHROID, LEVOTHROID) 75 MCG tablet Take 75 mcg by mouth daily.   Marland Kitchen losartan (COZAAR) 25 MG tablet Take 25 mg by mouth daily.  . magnesium oxide (MAG-OX) 400 MG tablet Take 400 mg by mouth.  . Multiple Vitamin (MULTI-VITAMIN) tablet Take 1 tablet by mouth daily.  Marland Kitchen omeprazole (PRILOSEC) 40 MG capsule Take 40 mg by mouth daily.  . sertraline (ZOLOFT) 50 MG tablet Take 50 mg by mouth daily.  . vitamin B-12 (CYANOCOBALAMIN) 500 MCG tablet Take 500 mcg by mouth daily.   No facility-administered encounter medications on file as of 01/01/2020.     Anay Rathe Jenetta Downer, NP

## 2020-01-11 DIAGNOSIS — E782 Mixed hyperlipidemia: Secondary | ICD-10-CM | POA: Diagnosis not present

## 2020-01-16 ENCOUNTER — Telehealth: Payer: Self-pay | Admitting: Adult Health Nurse Practitioner

## 2020-01-16 NOTE — Telephone Encounter (Signed)
Spoke with patient's wife Earlie Server regarding rescheduling todays [facility] Palliative appointment. She advised she is not home but will call me back this afternoon.

## 2020-01-17 ENCOUNTER — Telehealth: Payer: Self-pay | Admitting: Adult Health Nurse Practitioner

## 2020-01-17 NOTE — Telephone Encounter (Signed)
Called to reschedule visit.  Left VM with reason for call and call back info Ramonda Galyon K. Olena Heckle NP

## 2020-01-17 NOTE — Telephone Encounter (Signed)
Spoke with wife and scheduled appointment for 01/22/20 at 3:30 Lateia Fraser K. Olena Heckle NP

## 2020-01-22 ENCOUNTER — Other Ambulatory Visit: Payer: Self-pay

## 2020-01-22 ENCOUNTER — Other Ambulatory Visit: Payer: PPO | Admitting: Adult Health Nurse Practitioner

## 2020-01-22 DIAGNOSIS — G20 Parkinson's disease: Secondary | ICD-10-CM

## 2020-01-22 DIAGNOSIS — Z515 Encounter for palliative care: Secondary | ICD-10-CM | POA: Diagnosis not present

## 2020-01-22 NOTE — Progress Notes (Signed)
[facility] Consult Note Telephone: [phone]  Fax: [phone]  PATIENT NAME: Fred ELLENDER Sr. DOB: 1931/06/11 MRN: [id number]  PRIMARY CARE PROVIDER:   Rusty Aus, MD  REFERRING PROVIDER:  Rusty Aus, MD [address],  [place] [address]  RESPONSIBLE PARTY:   Wife, Glennon Mac: [phone] C: [phone]    RECOMMENDATIONS and PLAN:  1.  Advanced care planning. Patient is a full code  2.  Parkinson's disease.  Patient is on Sinemet 25/250 mg 3 times daily and Sinemet CR 50/200 mg nightly.  No complaints of increased tremors or freezing episodes.  He denies pain.  Does get dizzy with position changes and that it helps to change positions slowly and waiting before changing to a new position.  Is being seen by neurology.  Continue follow-up and recommendations by neurology.  3.  Functional status.  After stroke earlier this year patient is pretty much chair and bedbound.  Wife does report that at times they do have to use a Hoyer lift for transfers.  Patient is able to shift his weight on his own when sitting in chair.  Requires assistance with ADLs.  Is able to feed himself.  Wife states that she was able to reach out to [facility] to start OT/PT.  4.  Nutritional status.  Wife states that he sleeps more often.  Usually only eats about 2 meals a day and only eats up to 75% of each of those meals.  Has not noticed changes in how his clothing fits that would indicate any significant weight loss.  Continue to encourage him to eat at meals and add supplementation if needed.  Patient appears to be back at baseline after being treated for possible URI a couple weeks ago.  Denies any increased SOB or cough, fever, N/V/D, chest pain, dysuria, hematuria, hematochezia.  Palliative will continue to monitor for symptom management/decline and to address ACP. Wife prefers to call as needed.   Will call in 6-8 weeks to check in  I spent 35 minutes providing this consultation,  From 3:30 to 4:00 including time spent with patient/family, chart review, provider coordination, documentation. More than 50% of the time in this consultation was spent coordinating communication.   HISTORY OF PRESENT ILLNESS:  Fred HAUGAN Sr. is a 86 y.o. year old male with multiple medical problems including Parkinson's disease, mild dementia, CKD stage 3, HTN, hypothyroidsim. Palliative Care was asked to help address goals of care.   CODE STATUS: full code  PPS: 30% HOSPICE ELIGIBILITY/DIAGNOSIS: TBD  PHYSICAL EXAM:  BP 128/72 HR 52 O2 96% on room air General: NAD, frail appearing, thin Cardiovascular: regular rate and rhythm Pulmonary: Lung sounds clear; normal respiratory effort Abdomen: soft, nontender, + bowel sounds GU: no suprapubic tenderness Extremities: no edema, no joint deformities Skin: no rashes on exposed skin Neurological: Weakness but otherwise nonfocal  PAST MEDICAL HISTORY:  Past Medical History:  Diagnosis Date  . Arthritis    fingers, neck  . Benign neoplasm of colon, unspecified   . Calculus of kidney   . Cancer of prostate [facility]) 12/21/2013   3/07 resection  . Cancer of skin   . Cerebrovascular accident (CVA) due to embolism of left anterior cerebral artery ([facility]) 12/25/2015   Carotid negative, echo negative, right hand weakness, 6/17  . Chronic kidney disease, stage III (moderate)   . Dementia ([facility])   . Depression  unspecified  . Ganglion of tendon sheath   . GERD (gastroesophageal reflux disease)    rare  . History of urethral stricture    postoperative urethral stricture  . Hyperlipidemia, unspecified 12/21/2013  . Hypertension 12/31/2013   Echo s/p fall 11/15  . Kidney stone 12/21/2013  . Lumbar disc disease 12/21/2013  . Malignant neoplasm of prostate ([place])    08/2005 Gleason's  7 (3 4) left base, left mid medial, and left mid lateral. PSA 4.6 with a  percent free PSA  . Melanoma ([place])    Stage I, s/p resection 12/2007  . Microscopic hematuria   . Nonspecific finding on examination of urine   . Other hammer toe(s) (acquired), unspecified foot   . Stress incontinence, male   . Stroke ([facility])   . Unilateral inguinal hernia, without obstruction or gangrene, not specified as recurrent    unilateral or unspecified  . Vascular disorder of penis     SOCIAL HX:  Social History   Tobacco Use  . Smoking status: Never Smoker  . Smokeless tobacco: Never Used  Substance Use Topics  . Alcohol use: Yes    Alcohol/week: 4.0 standard drinks    Types: 4 Glasses of wine per week    Comment: occasionally    ALLERGIES: No Known Allergies   PERTINENT MEDICATIONS:  Outpatient Encounter Medications as of 01/22/2020  Medication Sig  . Acetaminophen (TYLENOL) 325 MG CAPS Take 325 mg by mouth daily as needed.  Marland Kitchen atorvastatin (LIPITOR) 10 MG tablet Take 2 tablets (20 mg total) by mouth daily. This is an increase from 10 mg daily.  . carbidopa-levodopa (SINEMET CR) 50-200 MG tablet Take 1 tablet by mouth at bedtime.  . carbidopa-levodopa (SINEMET IR) 25-250 MG tablet Take 1 tablet by mouth 3 (three) times daily.   . Cholecalciferol (VITAMIN D3) 2000 units capsule Take 1 capsule by mouth daily.  . clopidogrel (PLAVIX) 75 MG tablet Take 75 mg by mouth daily.  Marland Kitchen donepezil (ARICEPT) 5 MG tablet Take 5 mg by mouth at bedtime.  . DULoxetine (CYMBALTA) 30 MG capsule Take 30 mg by mouth daily.   Marland Kitchen levothyroxine (SYNTHROID, LEVOTHROID) 75 MCG tablet Take 75 mcg by mouth daily.   Marland Kitchen losartan (COZAAR) 25 MG tablet Take 25 mg by mouth daily.  . magnesium oxide (MAG-OX) 400 MG tablet Take 400 mg by mouth.  . Multiple Vitamin (MULTI-VITAMIN) tablet Take 1 tablet by mouth daily.  Marland Kitchen omeprazole (PRILOSEC) 40 MG capsule Take 40 mg by mouth daily.  . sertraline (ZOLOFT) 50 MG tablet Take 50 mg by mouth daily.  . vitamin B-12 (CYANOCOBALAMIN) 500 MCG tablet Take 500 mcg by  mouth daily.   No facility-administered encounter medications on file as of 01/22/2020.      Kennice Finnie Jenetta Downer, NP

## 2020-01-23 DIAGNOSIS — G20 Parkinson's disease: Secondary | ICD-10-CM | POA: Diagnosis not present

## 2020-01-23 DIAGNOSIS — F330 Major depressive disorder, recurrent, mild: Secondary | ICD-10-CM | POA: Diagnosis not present

## 2020-01-23 DIAGNOSIS — I63422 Cerebral infarction due to embolism of left anterior cerebral artery: Secondary | ICD-10-CM | POA: Diagnosis not present

## 2020-01-23 DIAGNOSIS — F0390 Unspecified dementia without behavioral disturbance: Secondary | ICD-10-CM | POA: Diagnosis not present

## 2020-01-23 DIAGNOSIS — Z0000 Encounter for general adult medical examination without abnormal findings: Secondary | ICD-10-CM | POA: Diagnosis not present

## 2020-01-31 DIAGNOSIS — G4733 Obstructive sleep apnea (adult) (pediatric): Secondary | ICD-10-CM | POA: Diagnosis not present

## 2020-01-31 DIAGNOSIS — G20 Parkinson's disease: Secondary | ICD-10-CM | POA: Diagnosis not present

## 2020-01-31 DIAGNOSIS — I639 Cerebral infarction, unspecified: Secondary | ICD-10-CM | POA: Diagnosis not present

## 2020-03-03 ENCOUNTER — Telehealth: Payer: Self-pay | Admitting: Adult Health Nurse Practitioner

## 2020-03-03 NOTE — Telephone Encounter (Signed)
Called wife to see how her husband was doing and if he needed a visit.  States that everything is about the same and did not need visit at this time.  Will check in in a couple months. Fred Jones K. Olena Heckle NP

## 2020-03-14 DIAGNOSIS — G20 Parkinson's disease: Secondary | ICD-10-CM | POA: Diagnosis not present

## 2020-03-14 DIAGNOSIS — F0150 Vascular dementia without behavioral disturbance: Secondary | ICD-10-CM | POA: Diagnosis not present

## 2020-03-14 DIAGNOSIS — G309 Alzheimer's disease, unspecified: Secondary | ICD-10-CM | POA: Diagnosis not present

## 2020-03-14 DIAGNOSIS — Z9989 Dependence on other enabling machines and devices: Secondary | ICD-10-CM | POA: Diagnosis not present

## 2020-03-14 DIAGNOSIS — R441 Visual hallucinations: Secondary | ICD-10-CM | POA: Diagnosis not present

## 2020-03-14 DIAGNOSIS — F0280 Dementia in other diseases classified elsewhere without behavioral disturbance: Secondary | ICD-10-CM | POA: Diagnosis not present

## 2020-03-14 DIAGNOSIS — R2689 Other abnormalities of gait and mobility: Secondary | ICD-10-CM | POA: Diagnosis not present

## 2020-03-14 DIAGNOSIS — G4733 Obstructive sleep apnea (adult) (pediatric): Secondary | ICD-10-CM | POA: Diagnosis not present

## 2020-03-14 DIAGNOSIS — Z8673 Personal history of transient ischemic attack (TIA), and cerebral infarction without residual deficits: Secondary | ICD-10-CM | POA: Diagnosis not present

## 2020-03-28 DIAGNOSIS — Z20828 Contact with and (suspected) exposure to other viral communicable diseases: Secondary | ICD-10-CM | POA: Diagnosis not present

## 2020-05-28 DIAGNOSIS — I639 Cerebral infarction, unspecified: Secondary | ICD-10-CM | POA: Diagnosis not present

## 2020-05-28 DIAGNOSIS — G20 Parkinson's disease: Secondary | ICD-10-CM | POA: Diagnosis not present

## 2020-05-28 DIAGNOSIS — G8103 Flaccid hemiplegia affecting right nondominant side: Secondary | ICD-10-CM | POA: Diagnosis not present

## 2020-06-02 ENCOUNTER — Ambulatory Visit: Payer: PPO | Admitting: Dermatology

## 2020-06-02 ENCOUNTER — Encounter: Payer: Self-pay | Admitting: Dermatology

## 2020-06-02 ENCOUNTER — Other Ambulatory Visit: Payer: Self-pay

## 2020-06-02 DIAGNOSIS — Z85820 Personal history of malignant melanoma of skin: Secondary | ICD-10-CM | POA: Diagnosis not present

## 2020-06-02 DIAGNOSIS — L570 Actinic keratosis: Secondary | ICD-10-CM | POA: Diagnosis not present

## 2020-06-02 DIAGNOSIS — L821 Other seborrheic keratosis: Secondary | ICD-10-CM | POA: Diagnosis not present

## 2020-06-02 DIAGNOSIS — D1800 Hemangioma unspecified site: Secondary | ICD-10-CM

## 2020-06-02 DIAGNOSIS — L814 Other melanin hyperpigmentation: Secondary | ICD-10-CM | POA: Diagnosis not present

## 2020-06-02 DIAGNOSIS — Z1283 Encounter for screening for malignant neoplasm of skin: Secondary | ICD-10-CM

## 2020-06-02 DIAGNOSIS — Z85828 Personal history of other malignant neoplasm of skin: Secondary | ICD-10-CM | POA: Diagnosis not present

## 2020-06-02 DIAGNOSIS — D692 Other nonthrombocytopenic purpura: Secondary | ICD-10-CM

## 2020-06-02 DIAGNOSIS — D229 Melanocytic nevi, unspecified: Secondary | ICD-10-CM | POA: Diagnosis not present

## 2020-06-02 DIAGNOSIS — L578 Other skin changes due to chronic exposure to nonionizing radiation: Secondary | ICD-10-CM

## 2020-06-02 DIAGNOSIS — L820 Inflamed seborrheic keratosis: Secondary | ICD-10-CM | POA: Diagnosis not present

## 2020-06-02 MED ORDER — FLUOROURACIL 5 % EX CREA: TOPICAL_CREAM | CUTANEOUS | 0 refills | Status: DC

## 2020-06-02 NOTE — Progress Notes (Signed)
Follow-Up Visit   Subjective  Fred Jones Sr. is a 86 y.o. male who presents for the following: Annual Exam (Hx MM, SCC - ). Patient's wife has noticed that he picks at lesions on the arms and they would like them treated today. The patient presents for Total-Body Skin Exam (TBSE) for skin cancer screening and mole check.  The following portions of the chart were reviewed this encounter and updated as appropriate:  Tobacco  Allergies  Meds  Problems  Med Hx  Surg Hx  Fam Hx     Review of Systems:  No other skin or systemic complaints except as noted in HPI or Assessment and Plan.  Objective  Well appearing patient in no apparent distress; mood and affect are within normal limits.  A full examination was performed including scalp, head, eyes, ears, nose, lips, neck, chest, axillae, abdomen, back, buttocks, bilateral upper extremities, bilateral lower extremities, hands, feet, fingers, toes, fingernails, and toenails. All findings within normal limits unless otherwise noted below.  Objective  Arms x 3, chest and neck x 6, L thigh x 3, R thigh x 7, R calf x 2 (21): Erythematous keratotic or waxy stuck-on papule or plaque.   Objective  Scalp (5): Erythematous thin papules/macules with gritty scale.   Assessment & Plan  Inflamed seborrheic keratosis (21) Arms x 3, chest and neck x 6, L thigh x 3, R thigh x 7, R calf x 2  Destruction of lesion - Arms x 3, chest and neck x 6, L thigh x 3, R thigh x 7, R calf x 2 Complexity: simple   Destruction method: cryotherapy   Informed consent: discussed and consent obtained   Timeout:  patient name, date of birth, surgical site, and procedure verified Lesion destroyed using liquid nitrogen: Yes   Region frozen until ice ball extended beyond lesion: Yes   Outcome: patient tolerated procedure well with no complications   Post-procedure details: wound care instructions given    AK (actinic keratosis) (5) Scalp Destruction of lesion  - Scalp Complexity: simple   Destruction method: cryotherapy   Informed consent: discussed and consent obtained   Timeout:  patient name, date of birth, surgical site, and procedure verified Lesion destroyed using liquid nitrogen: Yes   Region frozen until ice ball extended beyond lesion: Yes   Outcome: patient tolerated procedure well with no complications   Post-procedure details: wound care instructions given    Actinic skin damage Scalp - Discussed "Field Treatment" for Severe, Confluent Actinic Changes with Pre-Cancerous Actinic Keratoses due to cumulative sun exposure/UV radiation exposure over time Field treatment involves treatment of an entire area of skin that has confluent Actinic Changes (Sun/ Ultraviolet light damage) and PreCancerous Actinic Keratoses by method of PhotoDynamic Therapy (PDT) and/or prescription Topical Chemotherapy agents such as 5-fluorouracil, 5-fluorouracil/calcipotriene, and/or imiquimod.  The purpose is to decrease the number of clinically evident and subclinical PreCancerous lesions to prevent progression to development of skin cancer by chemically destroying early precancer changes that may or may not be visible.  It has been shown to reduce the risk of developing skin cancer in the treated area. As a result of treatment, redness, scaling, crusting, and open sores may occur during treatment course. One or more than one of these methods may be used and may have to be used several times to control, suppress and eliminate the PreCancerous changes. Discussed treatment course, expected reaction, and possible side effects. - Starting in January use 5FU BID x 2 weeks  to the scalp. fluorouracil (EFUDEX) 5 % cream - Scalp   Lentigines - Scattered tan macules - Discussed due to sun exposure - Benign, observe - Call for any changes  Seborrheic Keratoses - Stuck-on, waxy, tan-brown papules and plaques  - Discussed benign etiology and prognosis. - Observe - Call for  any changes  Melanocytic Nevi - Tan-brown and/or pink-flesh-colored symmetric macules and papules - Benign appearing on exam today - Observation - Call clinic for new or changing moles - Recommend daily use of broad spectrum spf 30+ sunscreen to sun-exposed areas.   Hemangiomas - Red papules - Discussed benign nature - Observe - Call for any changes  History of Squamous Cell Carcinoma of the Skin - No evidence of recurrence today - No lymphadenopathy - Recommend regular full body skin exams - Recommend daily broad spectrum sunscreen SPF 30+ to sun-exposed areas, reapply every 2 hours as needed.  - Call if any new or changing lesions are noted between office visits  History of Melanoma - No evidence of recurrence today - No lymphadenopathy - Recommend regular full body skin exams - Recommend daily broad spectrum sunscreen SPF 30+ to sun-exposed areas, reapply every 2 hours as needed.  - Call if any new or changing lesions are noted between office visits  Purpura - Chronic; persistent and recurrent.  Treatable, but not curable. - Violaceous macules and patches - Benign - Related to age, sun damage and/or use of blood thinners - Observe - Can use OTC arnica containing moisturizer such as Dermend Bruise Formula if desired - Call for worsening or other concerns  Actinic Damage - chronic, secondary to cumulative UV radiation exposure/sun exposure over time - diffuse scaly erythematous macules with underlying dyspigmentation - Recommend daily broad spectrum sunscreen SPF 30+ to sun-exposed areas, reapply every 2 hours as needed.  - Call for new or changing lesions.  Skin cancer screening performed today.  Return in about 6 months (around 11/30/2020) for AK follow up .  [name], CMA, am acting as scribe for Sarina Ser, MD .  Documentation: I have reviewed the above documentation for accuracy and completeness, and I agree with the above.  Sarina Ser,  MD

## 2020-06-03 ENCOUNTER — Encounter: Payer: Self-pay | Admitting: Dermatology

## 2020-06-16 DIAGNOSIS — E441 Mild protein-calorie malnutrition: Secondary | ICD-10-CM | POA: Diagnosis not present

## 2020-06-16 DIAGNOSIS — F0280 Dementia in other diseases classified elsewhere without behavioral disturbance: Secondary | ICD-10-CM | POA: Diagnosis not present

## 2020-06-16 DIAGNOSIS — F0150 Vascular dementia without behavioral disturbance: Secondary | ICD-10-CM | POA: Diagnosis not present

## 2020-06-16 DIAGNOSIS — R2689 Other abnormalities of gait and mobility: Secondary | ICD-10-CM | POA: Diagnosis not present

## 2020-06-16 DIAGNOSIS — G20 Parkinson's disease: Secondary | ICD-10-CM | POA: Diagnosis not present

## 2020-06-16 DIAGNOSIS — R441 Visual hallucinations: Secondary | ICD-10-CM | POA: Diagnosis not present

## 2020-06-16 DIAGNOSIS — G309 Alzheimer's disease, unspecified: Secondary | ICD-10-CM | POA: Diagnosis not present

## 2020-07-02 ENCOUNTER — Other Ambulatory Visit: Payer: PPO | Admitting: Adult Health Nurse Practitioner

## 2020-07-02 ENCOUNTER — Telehealth: Payer: Self-pay

## 2020-07-02 ENCOUNTER — Other Ambulatory Visit: Payer: Self-pay

## 2020-07-02 DIAGNOSIS — G309 Alzheimer's disease, unspecified: Secondary | ICD-10-CM

## 2020-07-02 DIAGNOSIS — F0281 Dementia in other diseases classified elsewhere with behavioral disturbance: Secondary | ICD-10-CM

## 2020-07-02 DIAGNOSIS — G20 Parkinson's disease: Secondary | ICD-10-CM | POA: Diagnosis not present

## 2020-07-02 DIAGNOSIS — Z515 Encounter for palliative care: Secondary | ICD-10-CM | POA: Diagnosis not present

## 2020-07-02 NOTE — Progress Notes (Signed)
[facility] Consult Note Telephone: [phone]  Fax: [phone]  PATIENT NAME: Fred BERRIOS Sr. DOB: July 29, 1930 MRN: [id number]  PRIMARY CARE PROVIDER:   Rusty Aus, MD  REFERRING PROVIDER:  Rusty Aus, MD [address] [place] [place],  [place] [address]  RESPONSIBLE PARTY:  Wife, Fred Jones Mac: [phone] C: [phone]    RECOMMENDATIONS and PLAN:  1.  Advanced care planning. Patient is a full code  2.  Parkinson's/dementia.  Patient having increased confusion and agitation.  Discussed with family and caregiver to try to go along with him as much as possible to decrease his agitation.  He does take seroquel 25 mg 2 tabs at night.  Have suggested using the seroquel 25 mg 1 tab Qhrs PRN to see if this will help and to monitor for increased drowsiness.  Discussed watching for cues that he may be getting more agitated to more quickly get the seroquel in him to calm him.  This will be harder as he is having agitation at different times of the day instead at a specific time of day such as with sundowning.  Discussed keeping him hydrated as dehydration and infection can cause increased confusion and agitation.  Family wanting to know if they can keep urine specimen cups in the home in case they suspect UTI to more quickly get him assessed if the situation came up.  Will reach out to provider with recommendation for adding seroquel  PRN and the specimen cups in the home.    Palliative will continue to monitor for symptom management/decline and make recommendations as needed.  Wife prefers as needed visits.  Did discuss who to call during business hours and after hours.  Encouraged to call with any questions or concerns.  I spent 60 minutes providing this consultation. More than 50% of the time in this consultation was spent coordinating communication.   HISTORY OF PRESENT ILLNESS:  Fred GOETTL Sr. is a  86 y.o. year old male with multiple medical problems including Parkinson's disease, dementia, CKD stage 3, HTN, hypothyroidsim. Palliative Care was asked to help address goals of care. Per family and in home caregiver, patient is having more agitation and confusion.  He will state that he is waiting for movers.  He is more confused today and states that he does not know where he is in his own home. No specific time of day in which his agitation occurs No reports of fever, increased SOB or cough, no urinary or bowel changes, no hematuria, melena, or hematochezia. VS WNL and lung sounds clear.  Most recent weight is 155 pounds which family states is down about 10 pounds.  This has been a slow weight loss.    No indication of infection.    CODE STATUS: full code  PPS: 30% HOSPICE ELIGIBILITY/DIAGNOSIS: TBD  PHYSICAL EXAM:  BP 132/68  HR 52  O2 96% on RA  Weight 155 pounds BMI 24.34 General: NAD, frail appearing, thin Eyes: sclera anicteric and noninjected with no discharge noted Cardiovascular: regular rate and rhythm Pulmonary: clear ant fields; normal respiratory effort Abdomen: soft, nontender, + bowel sounds Extremities: no edema Skin: no rashes on exposed skin Neurological: Weakness: A&O to person only today   PAST MEDICAL HISTORY:  Past Medical History:  Diagnosis Date  . Arthritis    fingers, neck  . Benign neoplasm of colon, unspecified   . Calculus of kidney   . Cancer of  prostate [facility]) 12/21/2013   3/07 resection  . Cancer of skin   . Cerebrovascular accident (CVA) due to embolism of left anterior cerebral artery ([place]) 12/25/2015   Carotid negative, echo negative, right hand weakness, 6/17  . Chronic kidney disease, stage III (moderate) ([facility])   . Dementia ([place])   . Depression    unspecified  . Ganglion of tendon sheath   . GERD (gastroesophageal reflux disease)    rare  . History of urethral stricture    postoperative urethral stricture  . Hyperlipidemia, unspecified  12/21/2013  . Hypertension 12/31/2013   Echo s/p fall 11/15  . Kidney stone 12/21/2013  . Lumbar disc disease 12/21/2013  . Malignant neoplasm of prostate ([place])    08/2005 Gleason's  7 (3 4) left base, left mid medial, and left mid lateral. PSA 4.6 with a percent free PSA  . Melanoma ([facility]) 12/18/2007   RUQ abdomen costal margin.  MM, superfiical spreading. Clark's level II, Breslow's 0.81mm.   . Melanoma in situ ([place]) 09/02/2008   Right lat. chin inf. to oral commisure. MMIS.  . Microscopic hematuria   . Nonspecific finding on examination of urine   . Other hammer toe(s) (acquired), unspecified foot   . Squamous cell carcinoma of skin 04/04/2014   Left ear antihelix. SCCis, hypertrophic.  Marland Kitchen Squamous cell carcinoma of skin 05/30/2019   Left lat. elbow. WD SCC.   Marland Kitchen Stress incontinence, male   . Stroke ([facility])   . Unilateral inguinal hernia, without obstruction or gangrene, not specified as recurrent    unilateral or unspecified  . Vascular disorder of penis     SOCIAL HX:  Social History   Tobacco Use  . Smoking status: Never Smoker  . Smokeless tobacco: Never Used  Substance Use Topics  . Alcohol use: Yes    Alcohol/week: 4.0 standard drinks    Types: 4 Glasses of wine per week    Comment: occasionally    ALLERGIES: No Known Allergies   PERTINENT MEDICATIONS:  Outpatient Encounter Medications as of 07/02/2020  Medication Sig  . Acetaminophen (TYLENOL) 325 MG CAPS Take 325 mg by mouth daily as needed.  Marland Kitchen atorvastatin (LIPITOR) 10 MG tablet Take 2 tablets (20 mg total) by mouth daily. This is an increase from 10 mg daily.  . carbidopa-levodopa (SINEMET CR) 50-200 MG tablet Take 1 tablet by mouth at bedtime.  . carbidopa-levodopa (SINEMET IR) 25-250 MG tablet Take 1 tablet by mouth 3 (three) times daily.   . Cholecalciferol (VITAMIN D3) 2000 units capsule Take 1 capsule by mouth daily.  . clopidogrel (PLAVIX) 75 MG tablet Take 75 mg by mouth daily.  Marland Kitchen donepezil (ARICEPT) 5 MG  tablet Take 5 mg by mouth at bedtime.  . DULoxetine (CYMBALTA) 30 MG capsule Take 30 mg by mouth daily.   . fluorouracil (EFUDEX) 5 % cream In January apply to the scalp BID x 2 weeks.  Marland Kitchen levothyroxine (SYNTHROID, LEVOTHROID) 75 MCG tablet Take 75 mcg by mouth daily.   Marland Kitchen losartan (COZAAR) 25 MG tablet Take 25 mg by mouth daily.  . magnesium oxide (MAG-OX) 400 MG tablet Take 400 mg by mouth.  . Multiple Vitamin (MULTI-VITAMIN) tablet Take 1 tablet by mouth daily.  Marland Kitchen omeprazole (PRILOSEC) 40 MG capsule Take 40 mg by mouth daily.  . sertraline (ZOLOFT) 50 MG tablet Take 50 mg by mouth daily.  . vitamin B-12 (CYANOCOBALAMIN) 500 MCG tablet Take 500 mcg by mouth daily.   No facility-administered encounter medications on file as  of 07/02/2020.     Earlyn Sylvan Jenetta Downer, NP

## 2020-07-02 NOTE — Telephone Encounter (Signed)
RN returned call to daughter regarding late afternoon call on 12/14. Daughter reports patient was more sluggish and confused yesterday and was not having a good day.  He is better today, but she prefers NP to visit.  Scheduled follow up for today 12/15 at 2 pm.  Discussed early s/s of decline, UTI, infection.  Notified NP of follow up visit.

## 2020-07-03 ENCOUNTER — Telehealth: Payer: Self-pay | Admitting: Adult Health Nurse Practitioner

## 2020-07-03 NOTE — Telephone Encounter (Signed)
Left message at PCP office with recommendation for seroquel PRN for increased agitation and confusion.  Also for family request to have urine specimen cup on hand in case they suspect UTI.  Kmarion Rawl K. Olena Heckle NP

## 2020-07-04 ENCOUNTER — Telehealth: Payer: Self-pay

## 2020-07-04 NOTE — Telephone Encounter (Signed)
Volunteer called patient on behalf of Palliative Care and patient is doing well.  

## 2020-07-24 DIAGNOSIS — R040 Epistaxis: Secondary | ICD-10-CM | POA: Diagnosis not present

## 2020-07-29 DIAGNOSIS — R300 Dysuria: Secondary | ICD-10-CM | POA: Diagnosis not present

## 2020-08-26 DIAGNOSIS — Z20828 Contact with and (suspected) exposure to other viral communicable diseases: Secondary | ICD-10-CM | POA: Diagnosis not present

## 2020-09-09 ENCOUNTER — Telehealth: Payer: Self-pay

## 2020-09-09 NOTE — Telephone Encounter (Signed)
Volunteer called patient on behalf of Palliative Care. Patient is doing well at this time.  

## 2020-09-30 DIAGNOSIS — G20 Parkinson's disease: Secondary | ICD-10-CM | POA: Diagnosis not present

## 2020-09-30 DIAGNOSIS — I63422 Cerebral infarction due to embolism of left anterior cerebral artery: Secondary | ICD-10-CM | POA: Diagnosis not present

## 2020-09-30 DIAGNOSIS — F0280 Dementia in other diseases classified elsewhere without behavioral disturbance: Secondary | ICD-10-CM | POA: Diagnosis not present

## 2020-09-30 DIAGNOSIS — F0150 Vascular dementia without behavioral disturbance: Secondary | ICD-10-CM | POA: Diagnosis not present

## 2020-09-30 DIAGNOSIS — G8193 Hemiplegia, unspecified affecting right nondominant side: Secondary | ICD-10-CM | POA: Diagnosis not present

## 2020-09-30 DIAGNOSIS — F330 Major depressive disorder, recurrent, mild: Secondary | ICD-10-CM | POA: Diagnosis not present

## 2020-09-30 DIAGNOSIS — G309 Alzheimer's disease, unspecified: Secondary | ICD-10-CM | POA: Diagnosis not present

## 2020-10-07 DIAGNOSIS — M25531 Pain in right wrist: Secondary | ICD-10-CM | POA: Diagnosis not present

## 2020-10-07 DIAGNOSIS — M19031 Primary osteoarthritis, right wrist: Secondary | ICD-10-CM | POA: Diagnosis not present

## 2020-10-23 ENCOUNTER — Other Ambulatory Visit: Payer: Self-pay

## 2020-10-23 ENCOUNTER — Emergency Department
Admission: EM | Admit: 2020-10-23 | Discharge: 2020-10-23 | Disposition: A | Discharge status: Home / Self Care | Payer: PPO | Attending: Emergency Medicine | Admitting: Emergency Medicine

## 2020-10-23 ENCOUNTER — Emergency Department: Payer: PPO

## 2020-10-23 DIAGNOSIS — Z79899 Other long term (current) drug therapy: Secondary | ICD-10-CM | POA: Insufficient documentation

## 2020-10-23 DIAGNOSIS — E039 Hypothyroidism, unspecified: Secondary | ICD-10-CM | POA: Diagnosis not present

## 2020-10-23 DIAGNOSIS — R55 Syncope and collapse: Secondary | ICD-10-CM | POA: Diagnosis not present

## 2020-10-23 DIAGNOSIS — Z8673 Personal history of transient ischemic attack (TIA), and cerebral infarction without residual deficits: Secondary | ICD-10-CM | POA: Insufficient documentation

## 2020-10-23 DIAGNOSIS — G319 Degenerative disease of nervous system, unspecified: Secondary | ICD-10-CM | POA: Diagnosis not present

## 2020-10-23 DIAGNOSIS — R0902 Hypoxemia: Secondary | ICD-10-CM | POA: Diagnosis not present

## 2020-10-23 DIAGNOSIS — F0390 Unspecified dementia without behavioral disturbance: Secondary | ICD-10-CM | POA: Diagnosis not present

## 2020-10-23 DIAGNOSIS — I129 Hypertensive chronic kidney disease with stage 1 through stage 4 chronic kidney disease, or unspecified chronic kidney disease: Secondary | ICD-10-CM | POA: Insufficient documentation

## 2020-10-23 DIAGNOSIS — G20 Parkinson's disease: Secondary | ICD-10-CM | POA: Diagnosis not present

## 2020-10-23 DIAGNOSIS — Z7902 Long term (current) use of antithrombotics/antiplatelets: Secondary | ICD-10-CM | POA: Insufficient documentation

## 2020-10-23 DIAGNOSIS — N183 Chronic kidney disease, stage 3 unspecified: Secondary | ICD-10-CM | POA: Diagnosis not present

## 2020-10-23 DIAGNOSIS — Z85038 Personal history of other malignant neoplasm of large intestine: Secondary | ICD-10-CM | POA: Diagnosis not present

## 2020-10-23 DIAGNOSIS — D692 Other nonthrombocytopenic purpura: Secondary | ICD-10-CM | POA: Diagnosis not present

## 2020-10-23 DIAGNOSIS — Z8546 Personal history of malignant neoplasm of prostate: Secondary | ICD-10-CM | POA: Insufficient documentation

## 2020-10-23 DIAGNOSIS — G9389 Other specified disorders of brain: Secondary | ICD-10-CM | POA: Diagnosis not present

## 2020-10-23 DIAGNOSIS — Z85828 Personal history of other malignant neoplasm of skin: Secondary | ICD-10-CM | POA: Diagnosis not present

## 2020-10-23 LAB — CBC WITH DIFFERENTIAL/PLATELET
Abs Immature Granulocytes: 0.84 10*3/uL — ABNORMAL HIGH (ref 0.00–0.07)
Basophils Absolute: 0.3 10*3/uL — ABNORMAL HIGH (ref 0.0–0.1)
Basophils Relative: 2 %
Eosinophils Absolute: 0.3 10*3/uL (ref 0.0–0.5)
Eosinophils Relative: 3 %
HCT: 44.9 % (ref 39.0–52.0)
Hemoglobin: 15 g/dL (ref 13.0–17.0)
Immature Granulocytes: 7 %
Lymphocytes Relative: 9 %
Lymphs Abs: 1.2 10*3/uL (ref 0.7–4.0)
MCH: 32.1 pg (ref 26.0–34.0)
MCHC: 33.4 g/dL (ref 30.0–36.0)
MCV: 96.1 fL (ref 80.0–100.0)
Monocytes Absolute: 0.8 10*3/uL (ref 0.1–1.0)
Monocytes Relative: 6 %
Neutro Abs: 9.6 10*3/uL — ABNORMAL HIGH (ref 1.7–7.7)
Neutrophils Relative %: 73 %
Platelets: 252 10*3/uL (ref 150–400)
RBC: 4.67 MIL/uL (ref 4.22–5.81)
RDW: 14.1 % (ref 11.5–15.5)
Smear Review: NORMAL
WBC: 12.8 10*3/uL — ABNORMAL HIGH (ref 4.0–10.5)
nRBC: 0 % (ref 0.0–0.2)

## 2020-10-23 LAB — BASIC METABOLIC PANEL
Anion gap: 6 (ref 5–15)
BUN: 20 mg/dL (ref 8–23)
CO2: 29 mmol/L (ref 22–32)
Calcium: 10.4 mg/dL — ABNORMAL HIGH (ref 8.9–10.3)
Chloride: 109 mmol/L (ref 98–111)
Creatinine, Ser: 1.34 mg/dL — ABNORMAL HIGH (ref 0.61–1.24)
GFR, Estimated: 51 mL/min — ABNORMAL LOW (ref >60)
Glucose, Bld: 122 mg/dL — ABNORMAL HIGH (ref 70–99)
Potassium: 4 mmol/L (ref 3.5–5.1)
Sodium: 144 mmol/L (ref 135–145)

## 2020-10-23 MED ORDER — SODIUM CHLORIDE 0.9 % IV BOLUS
500.0000 mL | Freq: Once | INTRAVENOUS | Status: AC
  Administered 2020-10-23: 500 mL via INTRAVENOUS

## 2020-10-23 NOTE — ED Provider Notes (Signed)
[facility] Emergency Department Provider Note  ____________________________________________  Time seen: Approximately 2:01 PM  I have reviewed the triage vital signs and the nursing notes.   HISTORY  Chief Complaint Loss of Consciousness  Level 5 Caveat: Portions of the History and Physical including HPI and review of systems are unable to be completely obtained due to patient being a poor historian    HPI Fred VASEK Sr. is a 87 y.o. male with a history of dementia, GERD, hypertension, Parkinson's disease, prior strokes  who was sent to the ED by EMS from his nursing facility due to syncope.  He was found to have passed out on the toilet.  On arrival to the ED he is awake, and EMS reports that wife has seen the patient and reports that he is currently back to his baseline mental status.  Patient denies any pain, reports that he feels fine.  No known recent illness or vomiting.  No diarrhea.     Past Medical History:  Diagnosis Date  . Arthritis    fingers, neck  . Benign neoplasm of colon, unspecified   . Calculus of kidney   . Cancer of prostate [facility]) 12/21/2013   3/07 resection  . Cancer of skin   . Cerebrovascular accident (CVA) due to embolism of left anterior cerebral artery ([place]) 12/25/2015   Carotid negative, echo negative, right hand weakness, 6/17  . Chronic kidney disease, stage III (moderate) (HCC)   . Dementia ([place])   . Depression    unspecified  . Ganglion of tendon sheath   . GERD (gastroesophageal reflux disease)    rare  . History of urethral stricture    postoperative urethral stricture  . Hyperlipidemia, unspecified 12/21/2013  . Hypertension 12/31/2013   Echo s/p fall 11/15  . Kidney stone 12/21/2013  . Lumbar disc disease 12/21/2013  . Malignant neoplasm of prostate ([place])    08/2005 Gleason's  7 (3 4) left base, left mid medial, and left mid lateral. PSA 4.6 with a percent free PSA  . Melanoma ([place]) 12/18/2007   RUQ  abdomen costal margin.  MM, superfiical spreading. Clark's level II, Breslow's 0.34mm.   . Melanoma in situ ([place]) 09/02/2008   Right lat. chin inf. to oral commisure. MMIS.  . Microscopic hematuria   . Nonspecific finding on examination of urine   . Other hammer toe(s) (acquired), unspecified foot   . Squamous cell carcinoma of skin 04/04/2014   Left ear antihelix. SCCis, hypertrophic.  Marland Kitchen Squamous cell carcinoma of skin 05/30/2019   Left lat. elbow. WD SCC.   Marland Kitchen Stress incontinence, male   . Stroke ([place])   . Unilateral inguinal hernia, without obstruction or gangrene, not specified as recurrent    unilateral or unspecified  . Vascular disorder of penis      Patient Active Problem List   Diagnosis Date Noted  . Other nonthrombocytopenic purpura ([place]) 10/23/2020  . Stroke ([place]) 08/11/2019  . Acute CVA (cerebrovascular accident) ([place]) 08/10/2019  . Acquired hypothyroidism 08/10/2019  . Parkinson's disease ([place]) 08/10/2019  . History of CVA (cerebrovascular accident) 08/10/2019  . CAP (community acquired pneumonia) 07/23/2017  . Weakness 07/07/2016  . Stroke (cerebrum) ([place]) 12/20/2015  . HTN (hypertension) 12/20/2015  . GERD (gastroesophageal reflux disease) 12/20/2015  . Depression 12/20/2015     Past Surgical History:  Procedure Laterality Date  . CATARACT EXTRACTION W/PHACO Right 11/27/2014   Procedure: CATARACT EXTRACTION PHACO AND INTRAOCULAR LENS PLACEMENT (IOC);  Surgeon: Leandrew Koyanagi, MD;  Location: [facility];  Service: Ophthalmology;  Laterality: Right;  . COLONOSCOPY     with removal of colonic polyps  . EXTRACORPOREAL SHOCK WAVE LITHOTRIPSY Left 05/2012  . EYE SURGERY Left 09/25/14   cataract @[facility]   . HERNIA REPAIR  1999  . HERNIA REPAIR Bilateral 05/2008   Bilateral inguinal hernia repair --- Dr. Tamala Julian  . KIDNEY STONE SURGERY    . MELANOMA EXCISION  12/2007   melanoma resection  . RETROPUBIC PROSTATECTOMY  09/2005   Radical prostatectomy  perineal - Dr. Jacqlyn Larsen  . WRIST GANGLION EXCISION Bilateral      Prior to Admission medications   Medication Sig Start Date End Date Taking? Authorizing Provider  Acetaminophen (TYLENOL) 325 MG CAPS Take 325 mg by mouth daily as needed.    [provider]  atorvastatin (LIPITOR) 10 MG tablet Take 2 tablets (20 mg total) by mouth daily. This is an increase from 10 mg daily. 08/13/19 09/12/19  Enzo Bi, MD  carbidopa-levodopa (SINEMET CR) 50-200 MG tablet Take 1 tablet by mouth at bedtime. 07/12/19   [provider]  carbidopa-levodopa (SINEMET IR) 25-250 MG tablet Take 1 tablet by mouth 3 (three) times daily.  06/14/16   [provider]  Cholecalciferol (VITAMIN D3) 2000 units capsule Take 1 capsule by mouth daily.    [provider]  clopidogrel (PLAVIX) 75 MG tablet Take 75 mg by mouth daily.    [provider]  donepezil (ARICEPT) 5 MG tablet Take 5 mg by mouth at bedtime.    [provider]  DULoxetine (CYMBALTA) 30 MG capsule Take 30 mg by mouth daily.  07/05/16   [provider]  fluorouracil (EFUDEX) 5 % cream In January apply to the scalp BID x 2 weeks. 06/02/20   Ralene Bathe, MD  levothyroxine (SYNTHROID, LEVOTHROID) 75 MCG tablet Take 75 mcg by mouth daily.  03/02/17   [provider]  losartan (COZAAR) 25 MG tablet Take 25 mg by mouth daily.    [provider]  magnesium oxide (MAG-OX) 400 MG tablet Take 400 mg by mouth.    [provider]  Multiple Vitamin (MULTI-VITAMIN) tablet Take 1 tablet by mouth daily.    [provider]  omeprazole (PRILOSEC) 40 MG capsule Take 40 mg by mouth daily. 06/11/19   [provider]  sertraline (ZOLOFT) 50 MG tablet Take 50 mg by mouth daily. 03/21/18   [provider]  vitamin B-12 (CYANOCOBALAMIN) 500 MCG tablet Take 500 mcg by mouth daily.    [provider]     Allergies Patient has no known allergies.   Family  History  Problem Relation Age of Onset  . Cancer Other   . Stroke Other   . Heart attack Other   . Stroke Mother   . Cancer Mother   . Heart attack Mother   . Stroke Father   . Cancer Father   . Cancer Sister   . Prostate cancer Brother   . GU problems Neg Hx   . Kidney disease Neg Hx     Social History Social History   Tobacco Use  . Smoking status: Never Smoker  . Smokeless tobacco: Never Used  Vaping Use  . Vaping Use: Never used  Substance Use Topics  . Alcohol use: Yes    Alcohol/week: 4.0 standard drinks    Types: 4 Glasses of wine per week    Comment: occasionally  . Drug use: No    Review of Systems  Constitutional:   No fever or chills.  ENT:   No sore throat. No rhinorrhea. Cardiovascular:   No chest pain positive syncope. Respiratory:   No dyspnea or cough. Gastrointestinal:   Negative for abdominal pain, vomiting and diarrhea.  Musculoskeletal:   Negative for focal pain or swelling All other systems reviewed and are negative except as documented above in ROS and HPI.  ____________________________________________   PHYSICAL EXAM:  VITAL SIGNS: ED Triage Vitals  Enc Vitals Group     BP 10/23/20 1051 118/79     Pulse Rate 10/23/20 1051 82     Resp 10/23/20 1051 16     Temp 10/23/20 1103 97.7 F (36.5 C)     Temp Source 10/23/20 1103 Oral     SpO2 10/23/20 1051 94 %     Weight 10/23/20 1051 167 lb 8.8 oz (76 kg)     Height 10/23/20 1051 5\' 7"  (1.702 m)     Head Circumference --      Peak Flow --      Pain Score 10/23/20 1049 0     Pain Loc --      Pain Edu? --      Excl. in [place]? --     Vital signs reviewed, nursing assessments reviewed.   Constitutional: Awake and alert, oriented to self.. Non-toxic appearance. Eyes:   Conjunctivae are normal. EOMI. PERRL. ENT      Head:   Normocephalic with small abrasion to the left parietal scalp, hemostatic.Marland Kitchen      Nose:   Wearing a mask.      Mouth/Throat:   Wearing a mask.      Neck:   No  meningismus. Full ROM.  No midline tenderness Hematological/Lymphatic/Immunilogical:   No cervical lymphadenopathy. Cardiovascular:   RRR. Symmetric bilateral radial and DP pulses.  No murmurs. Cap refill less than 2 seconds. Respiratory:   Normal respiratory effort without tachypnea/retractions. Breath sounds are clear and equal bilaterally. No wheezes/rales/rhonchi. Gastrointestinal:   Soft and nontender. Non distended. There is no CVA tenderness.  No rebound, rigidity, or guarding. Genitourinary:   deferred Musculoskeletal:   Normal range of motion in all extremities. No joint effusions.  No lower extremity tenderness.  No edema. Neurologic:   Normal speech and language.  Motor grossly intact. No acute focal neurologic deficits are appreciated.  Skin:    Skin is warm, dry and intact. No rash noted.  No petechiae, purpura, or bullae.  ____________________________________________    LABS (pertinent positives/negatives) (all labs ordered are listed, but only abnormal results are displayed) Labs Reviewed  BASIC METABOLIC PANEL - Abnormal; Notable for the following components:      Result Value   Glucose, Bld 122 (*)    Creatinine, Ser 1.34 (*)    Calcium 10.4 (*)    GFR, Estimated 51 (*)    All other components within normal limits  CBC WITH DIFFERENTIAL/PLATELET - Abnormal; Notable for the following components:   WBC 12.8 (*)    Neutro Abs 9.6 (*)    Basophils Absolute 0.3 (*)    Abs Immature Granulocytes 0.84 (*)    All other components within normal limits   ____________________________________________   EKG  Interpreted by me Normal sinus rhythm rate of 88, left axis, normal intervals.  Poor R wave progression.  Normal ST segments and T waves.  No ischemic changes.  ____________________________________________    RADIOLOGY  CT Head Wo Contrast  Result Date: 10/23/2020 CLINICAL DATA:  Pt was found by nurse  on toilet. Pt unresponsive on Ems arrival to facility. On  arrival to ED, pt at baseline mentation per pts wife. Hx stroke with right sided deficits, abrasion to left parietal area EXAM: CT HEAD WITHOUT CONTRAST TECHNIQUE: Contiguous axial images were obtained from the base of the skull through the vertex without intravenous contrast. COMPARISON:  08/10/2019 FINDINGS: Brain: No evidence of acute infarction, hemorrhage, hydrocephalus, extra-axial collection or mass lesion/mass effect. There is ventricular and sulcal enlargement reflecting moderate generalized atrophy. Mild periventricular white matter hypoattenuation is noted bilaterally consistent with chronic microvascular ischemic change. These findings are stable. Vascular: No hyperdense vessel or unexpected calcification. Skull: Normal. Negative for fracture or focal lesion. Sinuses/Orbits: Globes and orbits are unremarkable. Sinuses and mastoid air cells are clear. Other: None. IMPRESSION: 1. No acute intracranial abnormalities. 2. Atrophy and mild chronic microvascular ischemic change stable from the prior study. Electronically Signed   By: Lajean Manes M.D.   On: 10/23/2020 11:41    ____________________________________________   PROCEDURES Procedures  ____________________________________________  DIFFERENTIAL DIAGNOSIS   Intracranial hemorrhage, dehydration, electrolyte abnormality, vagal episode  CLINICAL IMPRESSION / ASSESSMENT AND PLAN / ED COURSE  Medications ordered in the ED: Medications  sodium chloride 0.9 % bolus 500 mL (0 mLs Intravenous Stopped 10/23/20 1148)    Pertinent labs & imaging results that were available during my care of the patient were reviewed by me and considered in my medical decision making (see chart for details).  MERCY MALENA Sr. was evaluated in Emergency Department on 10/23/2020 for the symptoms described in the history of present illness. He was evaluated in the context of the global COVID-19 pandemic, which necessitated consideration that the patient might be  at risk for infection with the SARS-CoV-2 virus that causes COVID-19. Institutional protocols and algorithms that pertain to the evaluation of patients at risk for COVID-19 are in a state of rapid change based on information released by regulatory bodies including the CDC and federal and state organizations. These policies and algorithms were followed during the patient's care in the ED.   Patient brought to ED by EMS for syncope.  No specific complaints, at baseline mental status on arrival to ED.  Vital signs are normal, exam is reassuring.  CT head and labs are unremarkable.  Most likely this is a vagal episode related to using the bathroom, doubt underlying cardiovascular or neurologic event.  Stable for discharge home.  Discussed with spouse at bedside who agrees.      ____________________________________________   FINAL CLINICAL IMPRESSION(S) / ED DIAGNOSES    Final diagnoses:  Syncope, unspecified syncope type  Other nonthrombocytopenic purpura ([place])  Parkinson's disease [facility])     ED Discharge Orders    None      Portions of this note were generated with dragon dictation software. Dictation errors may occur despite best attempts at proofreading.   Carrie Mew, MD 10/23/20 [id number]

## 2020-10-23 NOTE — ED Triage Notes (Signed)
Pt BIBA for syncopal episode. Pt was found by nurse on toilet. Pt unresponsive on Ems arrival to facility. On arrival to ED, pt at baseline mentation per pts wife. Hx stroke with right sided deficits. Pt denies pain on arrival.

## 2020-10-23 NOTE — ED Triage Notes (Signed)
Pt abrasion to left side of head. Bleeding controlled.

## 2020-10-23 NOTE — ED Notes (Signed)
Patient is resting comfortably. Call light within reach. Fall precautions in place. Wife at bedside. Pt given a warm blanket.

## 2020-10-23 NOTE — ED Notes (Addendum)
Pt waiting on ride to get here. Pt altered so unable to sit in waiting room. Pt cleaned and brief changed.

## 2020-10-23 NOTE — ED Notes (Signed)
Patient is resting comfortably. Call light in reach. Fall precautions in place. Wife at bedside.

## 2020-10-23 NOTE — ED Notes (Signed)
Patient transported to CT 

## 2020-10-24 ENCOUNTER — Emergency Department
Admission: EM | Admit: 2020-10-24 | Discharge: 2020-10-24 | Disposition: A | Discharge status: Home / Self Care | Payer: PPO | Attending: Emergency Medicine | Admitting: Emergency Medicine

## 2020-10-24 ENCOUNTER — Other Ambulatory Visit: Payer: Self-pay

## 2020-10-24 DIAGNOSIS — G20 Parkinson's disease: Secondary | ICD-10-CM | POA: Diagnosis not present

## 2020-10-24 DIAGNOSIS — N183 Chronic kidney disease, stage 3 unspecified: Secondary | ICD-10-CM | POA: Diagnosis not present

## 2020-10-24 DIAGNOSIS — R0902 Hypoxemia: Secondary | ICD-10-CM | POA: Diagnosis not present

## 2020-10-24 DIAGNOSIS — Z8546 Personal history of malignant neoplasm of prostate: Secondary | ICD-10-CM | POA: Insufficient documentation

## 2020-10-24 DIAGNOSIS — R000 Tachycardia, unspecified: Secondary | ICD-10-CM | POA: Diagnosis not present

## 2020-10-24 DIAGNOSIS — E039 Hypothyroidism, unspecified: Secondary | ICD-10-CM | POA: Insufficient documentation

## 2020-10-24 DIAGNOSIS — F0281 Dementia in other diseases classified elsewhere with behavioral disturbance: Secondary | ICD-10-CM | POA: Insufficient documentation

## 2020-10-24 DIAGNOSIS — Z79899 Other long term (current) drug therapy: Secondary | ICD-10-CM | POA: Diagnosis not present

## 2020-10-24 DIAGNOSIS — I129 Hypertensive chronic kidney disease with stage 1 through stage 4 chronic kidney disease, or unspecified chronic kidney disease: Secondary | ICD-10-CM | POA: Diagnosis not present

## 2020-10-24 DIAGNOSIS — R001 Bradycardia, unspecified: Secondary | ICD-10-CM | POA: Insufficient documentation

## 2020-10-24 DIAGNOSIS — R404 Transient alteration of awareness: Secondary | ICD-10-CM | POA: Diagnosis not present

## 2020-10-24 DIAGNOSIS — I440 Atrioventricular block, first degree: Secondary | ICD-10-CM | POA: Diagnosis not present

## 2020-10-24 DIAGNOSIS — Z7902 Long term (current) use of antithrombotics/antiplatelets: Secondary | ICD-10-CM | POA: Insufficient documentation

## 2020-10-24 DIAGNOSIS — I10 Essential (primary) hypertension: Secondary | ICD-10-CM | POA: Diagnosis not present

## 2020-10-24 LAB — CBC
HCT: 41.9 % (ref 39.0–52.0)
Hemoglobin: 14.1 g/dL (ref 13.0–17.0)
MCH: 32.3 pg (ref 26.0–34.0)
MCHC: 33.7 g/dL (ref 30.0–36.0)
MCV: 96.1 fL (ref 80.0–100.0)
Platelets: 233 10*3/uL (ref 150–400)
RBC: 4.36 MIL/uL (ref 4.22–5.81)
RDW: 13.9 % (ref 11.5–15.5)
WBC: 10.4 10*3/uL (ref 4.0–10.5)
nRBC: 0 % (ref 0.0–0.2)

## 2020-10-24 LAB — COMPREHENSIVE METABOLIC PANEL
ALT: 6 U/L (ref 0–44)
AST: 29 U/L (ref 15–41)
Albumin: 3.5 g/dL (ref 3.5–5.0)
Alkaline Phosphatase: 56 U/L (ref 38–126)
Anion gap: 6 (ref 5–15)
BUN: 18 mg/dL (ref 8–23)
CO2: 29 mmol/L (ref 22–32)
Calcium: 9.6 mg/dL (ref 8.9–10.3)
Chloride: 108 mmol/L (ref 98–111)
Creatinine, Ser: 1.09 mg/dL (ref 0.61–1.24)
GFR, Estimated: >60 mL/min (ref >60)
Glucose, Bld: 95 mg/dL (ref 70–99)
Potassium: 4.2 mmol/L (ref 3.5–5.1)
Sodium: 143 mmol/L (ref 135–145)
Total Bilirubin: 0.8 mg/dL (ref 0.3–1.2)
Total Protein: 6 g/dL — ABNORMAL LOW (ref 6.5–8.1)

## 2020-10-24 LAB — TSH: TSH: 1.218 u[IU]/mL (ref 0.350–4.500)

## 2020-10-24 LAB — TROPONIN I (HIGH SENSITIVITY): Troponin I (High Sensitivity): 15 ng/L (ref <18)

## 2020-10-24 NOTE — ED Triage Notes (Signed)
Pt BIBA for bradycardia, Heart rate 45 with EMS. EMS gave 566ml bolus. Pts mental status at baseline per family. Pts basleine heart rate 60s per family. EKG sinus brady with 1st degree block. CBG 141. Pt denies complaints. Pt seen here yesterday for syncope.

## 2020-10-24 NOTE — Discharge Instructions (Addendum)
Please seek medical attention for any high fevers, chest pain, shortness of breath, change in behavior, persistent vomiting, bloody stool or any other new or concerning symptoms.  

## 2020-10-24 NOTE — ED Provider Notes (Signed)
[facility] Emergency Department Provider Note  Time seen: 7:33 PM  I have reviewed the triage vital signs and the nursing notes.   HISTORY  Chief Complaint Bradycardia   HPI [place]. is a 87 y.o. male with a past medical history of dementia, gastric reflux, hypertension, hyperlipidemia, presents to the emergency department for bradycardia.  According to EMS patient's home health nurse noted the patient to be bradycardic around 40 bpm and called EMS to bring the patient to the emergency department.  Patient was seen here yesterday  for syncopal episode and was discharged home.  Patient has no complaints tonight, cannot provide much history or review of systems however.  No chest pain.  No shortness of breath.  No distress lying in bed comfortably.  Past Medical History:  Diagnosis Date  . Arthritis    fingers, neck  . Benign neoplasm of colon, unspecified   . Calculus of kidney   . Cancer of prostate [facility]) 12/21/2013   3/07 resection  . Cancer of skin   . Cerebrovascular accident (CVA) due to embolism of left anterior cerebral artery ([place]) 12/25/2015   Carotid negative, echo negative, right hand weakness, 6/17  . Chronic kidney disease, stage III (moderate) (HCC)   . Dementia ([place])   . Depression    unspecified  . Ganglion of tendon sheath   . GERD (gastroesophageal reflux disease)    rare  . History of urethral stricture    postoperative urethral stricture  . Hyperlipidemia, unspecified 12/21/2013  . Hypertension 12/31/2013   Echo s/p fall 11/15  . Kidney stone 12/21/2013  . Lumbar disc disease 12/21/2013  . Malignant neoplasm of prostate ([place])    08/2005 Gleason's  7 (3 4) left base, left mid medial, and left mid lateral. PSA 4.6 with a percent free PSA  . Melanoma ([place]) 12/18/2007   RUQ abdomen costal margin.  MM, superfiical spreading. Clark's level II, Breslow's 0.28mm.   . Melanoma in situ ([place]) 09/02/2008   Right lat. chin inf. to  oral commisure. MMIS.  . Microscopic hematuria   . Nonspecific finding on examination of urine   . Other hammer toe(s) (acquired), unspecified foot   . Squamous cell carcinoma of skin 04/04/2014   Left ear antihelix. SCCis, hypertrophic.  Marland Kitchen Squamous cell carcinoma of skin 05/30/2019   Left lat. elbow. WD SCC.   Marland Kitchen Stress incontinence, male   . Stroke ([place])   . Unilateral inguinal hernia, without obstruction or gangrene, not specified as recurrent    unilateral or unspecified  . Vascular disorder of penis     Patient Active Problem List   Diagnosis Date Noted  . Other nonthrombocytopenic purpura ([place]) 10/23/2020  . Stroke ([place]) 08/11/2019  . Acute CVA (cerebrovascular accident) ([place]) 08/10/2019  . Acquired hypothyroidism 08/10/2019  . Parkinson's disease ([place]) 08/10/2019  . History of CVA (cerebrovascular accident) 08/10/2019  . CAP (community acquired pneumonia) 07/23/2017  . Weakness 07/07/2016  . Stroke (cerebrum) ([place]) 12/20/2015  . HTN (hypertension) 12/20/2015  . GERD (gastroesophageal reflux disease) 12/20/2015  . Depression 12/20/2015    Past Surgical History:  Procedure Laterality Date  . CATARACT EXTRACTION W/PHACO Right 11/27/2014   Procedure: CATARACT EXTRACTION PHACO AND INTRAOCULAR LENS PLACEMENT (IOC);  Surgeon: Leandrew Koyanagi, MD;  Location: [facility];  Service: Ophthalmology;  Laterality: Right;  . COLONOSCOPY     with removal of colonic polyps  . EXTRACORPOREAL SHOCK WAVE LITHOTRIPSY Left 05/2012  . EYE SURGERY Left 09/25/14  cataract @[facility]   . HERNIA REPAIR  1999  . HERNIA REPAIR Bilateral 05/2008   Bilateral inguinal hernia repair --- Dr. Tamala Julian  . KIDNEY STONE SURGERY    . MELANOMA EXCISION  12/2007   melanoma resection  . RETROPUBIC PROSTATECTOMY  09/2005   Radical prostatectomy perineal - Dr. Jacqlyn Larsen  . WRIST GANGLION EXCISION Bilateral     Prior to Admission medications   Medication Sig Start Date End Date Taking? Authorizing Provider   Acetaminophen (TYLENOL) 325 MG CAPS Take 325 mg by mouth daily as needed.    [provider]  atorvastatin (LIPITOR) 10 MG tablet Take 2 tablets (20 mg total) by mouth daily. This is an increase from 10 mg daily. 08/13/19 09/12/19  Enzo Bi, MD  carbidopa-levodopa (SINEMET CR) 50-200 MG tablet Take 1 tablet by mouth at bedtime. 07/12/19   [provider]  carbidopa-levodopa (SINEMET IR) 25-250 MG tablet Take 1 tablet by mouth 3 (three) times daily.  06/14/16   [provider]  Cholecalciferol (VITAMIN D3) 2000 units capsule Take 1 capsule by mouth daily.    [provider]  clopidogrel (PLAVIX) 75 MG tablet Take 75 mg by mouth daily.    [provider]  donepezil (ARICEPT) 5 MG tablet Take 5 mg by mouth at bedtime.    [provider]  DULoxetine (CYMBALTA) 30 MG capsule Take 30 mg by mouth daily.  07/05/16   [provider]  fluorouracil (EFUDEX) 5 % cream In January apply to the scalp BID x 2 weeks. 06/02/20   Ralene Bathe, MD  levothyroxine (SYNTHROID, LEVOTHROID) 75 MCG tablet Take 75 mcg by mouth daily.  03/02/17   [provider]  losartan (COZAAR) 25 MG tablet Take 25 mg by mouth daily.    [provider]  magnesium oxide (MAG-OX) 400 MG tablet Take 400 mg by mouth.    [provider]  Multiple Vitamin (MULTI-VITAMIN) tablet Take 1 tablet by mouth daily.    [provider]  omeprazole (PRILOSEC) 40 MG capsule Take 40 mg by mouth daily. 06/11/19   [provider]  sertraline (ZOLOFT) 50 MG tablet Take 50 mg by mouth daily. 03/21/18   [provider]  vitamin B-12 (CYANOCOBALAMIN) 500 MCG tablet Take 500 mcg by mouth daily.    [provider]    No Known Allergies  Family History  Problem Relation Age of Onset  . Cancer Other   . Stroke Other   . Heart attack Other   . Stroke Mother   . Cancer Mother   . Heart attack Mother   . Stroke Father   . Cancer  Father   . Cancer Sister   . Prostate cancer Brother   . GU problems Neg Hx   . Kidney disease Neg Hx     Social History Social History   Tobacco Use  . Smoking status: Never Smoker  . Smokeless tobacco: Never Used  Vaping Use  . Vaping Use: Never used  Substance Use Topics  . Alcohol use: Yes    Alcohol/week: 4.0 standard drinks    Types: 4 Glasses of wine per week    Comment: occasionally  . Drug use: No    Review of Systems Unable to obtain an adequate/accurate review of systems secondary to baseline dementia.  ____________________________________________   PHYSICAL EXAM:  VITAL SIGNS: ED Triage Vitals  Enc Vitals Group     BP 10/24/20 1930 (!) 188/94     Pulse Rate 10/24/20 1930 Marland Kitchen)  50     Resp 10/24/20 1930 17     Temp --      Temp src --      SpO2 10/24/20 1930 100 %     Weight 10/24/20 1929 167 lb 8.8 oz (76 kg)     Height 10/24/20 1929 5\' 7"  (1.702 m)     Head Circumference --      Peak Flow --      Pain Score --      Pain Loc --      Pain Edu? --      Excl. in [place]? --    Constitutional: Alert and oriented. Well appearing and in no distress. Eyes: Normal exam ENT      Head: Normocephalic and atraumatic.      Mouth/Throat: Mucous membranes are moist. Cardiovascular: Normal rate, regular rhythm.  Respiratory: Normal respiratory effort without tachypnea nor retractions. Breath sounds are clear Gastrointestinal: Soft and nontender. No distention. Musculoskeletal: Nontender with normal range of motion in all extremities.  Neurologic:  Normal speech and language. No gross focal neurologic deficits Skin:  Skin is warm, dry and intact.  Psychiatric: Mood and affect are normal.   ____________________________________________    EKG  EKG viewed and interpreted by myself shows sinus bradycardia at 48 bpm with a narrow QRS, left axis deviation, prolonged PR interval, nonspecific ST changes.  ____________________________________________   INITIAL  IMPRESSION / ASSESSMENT AND PLAN / ED COURSE  Pertinent labs & imaging results that were available during my care of the patient were reviewed by me and considered in my medical decision making (see chart for details).   Patient presents emergency department for bradycardia found by his home health nurse to be bradycardic around 40 bpm.  Per EMS patient was bradycardic at 41 bpm currently at 48 bpm in the emergency department.  Patient has no complaints but also has a history of dementia.  Appears well, is actually hypertensive currently.  Patient was seen here yesterday for syncopal episode ultimately discharged home.  Given the patient's bradycardia and a syncopal episode yesterday possibly indicating symptomatic bradycardia we will check labs and continue to closely monitor.  Patient may require admission to the hospital for telemetry monitoring overnight.  Lab work is pending.  Heart rate remains around 50 bpm for the most part but does occasionally drop into the low 40s and as high as approximately 60 bpm.  Does not appear to be on any rate limiting medications such as beta-blocker or calcium channel blocker.    JUNIE ENGRAM Sr. was evaluated in Emergency Department on 10/24/2020 for the symptoms described in the history of present illness. He was evaluated in the context of the global COVID-19 pandemic, which necessitated consideration that the patient might be at risk for infection with the SARS-CoV-2 virus that causes COVID-19. Institutional protocols and algorithms that pertain to the evaluation of patients at risk for COVID-19 are in a state of rapid change based on information released by regulatory bodies including the CDC and federal and state organizations. These policies and algorithms were followed during the patient's care in the ED.  ____________________________________________   FINAL CLINICAL IMPRESSION(S) / ED DIAGNOSES  Bradycardia   Harvest Dark, MD 11/03/20 2048

## 2020-10-24 NOTE — ED Notes (Signed)
Patient is resting comfortably. Call light in reach. Fall precautions in place. Wife at bedside.

## 2020-10-24 NOTE — ED Provider Notes (Signed)
Patient blood work without any concerning abnormalities. Heart rate stayed in the 50s. Discussed blood work with patient and family. At this time they are requesting discharge home. I do think this is reasonable given that patient's blood pressure has not been low. Discussed returning for any syncopal episodes, and importance of follow up.   Nance Pear, MD 10/24/20 [phone]

## 2020-10-27 ENCOUNTER — Other Ambulatory Visit: Payer: Self-pay

## 2020-10-27 ENCOUNTER — Emergency Department (HOSPITAL_COMMUNITY)
Admission: EM | Admit: 2020-10-27 | Discharge: 2020-10-28 | Disposition: A | Discharge status: Home / Self Care | Payer: PPO | Attending: Emergency Medicine | Admitting: Emergency Medicine

## 2020-10-27 ENCOUNTER — Encounter (HOSPITAL_COMMUNITY): Payer: Self-pay

## 2020-10-27 ENCOUNTER — Emergency Department (HOSPITAL_COMMUNITY): Payer: PPO

## 2020-10-27 DIAGNOSIS — Z85828 Personal history of other malignant neoplasm of skin: Secondary | ICD-10-CM | POA: Insufficient documentation

## 2020-10-27 DIAGNOSIS — R4182 Altered mental status, unspecified: Secondary | ICD-10-CM | POA: Diagnosis not present

## 2020-10-27 DIAGNOSIS — Z8546 Personal history of malignant neoplasm of prostate: Secondary | ICD-10-CM | POA: Diagnosis not present

## 2020-10-27 DIAGNOSIS — I10 Essential (primary) hypertension: Secondary | ICD-10-CM | POA: Diagnosis not present

## 2020-10-27 DIAGNOSIS — I129 Hypertensive chronic kidney disease with stage 1 through stage 4 chronic kidney disease, or unspecified chronic kidney disease: Secondary | ICD-10-CM | POA: Diagnosis not present

## 2020-10-27 DIAGNOSIS — G20 Parkinson's disease: Secondary | ICD-10-CM | POA: Insufficient documentation

## 2020-10-27 DIAGNOSIS — R531 Weakness: Secondary | ICD-10-CM | POA: Insufficient documentation

## 2020-10-27 DIAGNOSIS — N183 Chronic kidney disease, stage 3 unspecified: Secondary | ICD-10-CM | POA: Diagnosis not present

## 2020-10-27 DIAGNOSIS — F0390 Unspecified dementia without behavioral disturbance: Secondary | ICD-10-CM | POA: Insufficient documentation

## 2020-10-27 DIAGNOSIS — R4020 Unspecified coma: Secondary | ICD-10-CM | POA: Diagnosis not present

## 2020-10-27 DIAGNOSIS — G4733 Obstructive sleep apnea (adult) (pediatric): Secondary | ICD-10-CM | POA: Diagnosis not present

## 2020-10-27 DIAGNOSIS — R001 Bradycardia, unspecified: Secondary | ICD-10-CM | POA: Diagnosis not present

## 2020-10-27 DIAGNOSIS — Z9989 Dependence on other enabling machines and devices: Secondary | ICD-10-CM | POA: Diagnosis not present

## 2020-10-27 DIAGNOSIS — E782 Mixed hyperlipidemia: Secondary | ICD-10-CM | POA: Diagnosis not present

## 2020-10-27 LAB — CBC WITH DIFFERENTIAL/PLATELET
Abs Immature Granulocytes: 0.85 10*3/uL — ABNORMAL HIGH (ref 0.00–0.07)
Basophils Absolute: 0.3 10*3/uL — ABNORMAL HIGH (ref 0.0–0.1)
Basophils Relative: 2 %
Eosinophils Absolute: 0.5 10*3/uL (ref 0.0–0.5)
Eosinophils Relative: 4 %
HCT: 44.5 % (ref 39.0–52.0)
Hemoglobin: 14.8 g/dL (ref 13.0–17.0)
Immature Granulocytes: 7 %
Lymphocytes Relative: 12 %
Lymphs Abs: 1.5 10*3/uL (ref 0.7–4.0)
MCH: 32.4 pg (ref 26.0–34.0)
MCHC: 33.3 g/dL (ref 30.0–36.0)
MCV: 97.4 fL (ref 80.0–100.0)
Monocytes Absolute: 0.7 10*3/uL (ref 0.1–1.0)
Monocytes Relative: 6 %
Neutro Abs: 8.2 10*3/uL — ABNORMAL HIGH (ref 1.7–7.7)
Neutrophils Relative %: 69 %
Platelets: 271 10*3/uL (ref 150–400)
RBC: 4.57 MIL/uL (ref 4.22–5.81)
RDW: 14 % (ref 11.5–15.5)
WBC: 11.9 10*3/uL — ABNORMAL HIGH (ref 4.0–10.5)
nRBC: 0 % (ref 0.0–0.2)

## 2020-10-27 LAB — COMPREHENSIVE METABOLIC PANEL
ALT: 9 U/L (ref 0–44)
AST: 23 U/L (ref 15–41)
Albumin: 3.7 g/dL (ref 3.5–5.0)
Alkaline Phosphatase: 65 U/L (ref 38–126)
Anion gap: 4 — ABNORMAL LOW (ref 5–15)
BUN: 16 mg/dL (ref 8–23)
CO2: 27 mmol/L (ref 22–32)
Calcium: 10.1 mg/dL (ref 8.9–10.3)
Chloride: 108 mmol/L (ref 98–111)
Creatinine, Ser: 1.15 mg/dL (ref 0.61–1.24)
GFR, Estimated: >60 mL/min (ref >60)
Glucose, Bld: 108 mg/dL — ABNORMAL HIGH (ref 70–99)
Potassium: 4 mmol/L (ref 3.5–5.1)
Sodium: 139 mmol/L (ref 135–145)
Total Bilirubin: 0.7 mg/dL (ref 0.3–1.2)
Total Protein: 6.5 g/dL (ref 6.5–8.1)

## 2020-10-27 LAB — TROPONIN I (HIGH SENSITIVITY): Troponin I (High Sensitivity): 9 ng/L (ref <18)

## 2020-10-27 NOTE — ED Triage Notes (Signed)
Emergency Medicine Provider Triage Evaluation Note  Fred ISHAM Sr. , a 87 y.o. male  was evaluated in triage.  Pt complains of spots of episode.  Is poor historian does not provide history  Wife states they live at assisted living facility.  She noted patient taking a nap.  She went to wake him up.  She was unable to wake him up.  She called the nurses from the facility.  Stated he was breathing however was unable to arouse.  Patient was unresponsive per wife for at least an hour.  EMS subsequently called.  EMS wanted to transfer the patient to the local hospital which was  regional.  Wife states that they were already at [facility] regional last week for bradycardia" they did not do anything."  All day today.  They placed a Holter monitor.  No recent falls or injuries.  Walks with a walker  Syncope last week with unremarkable workup  Hx of stroke with right sided deficits   Patient denies any complaints    Review of Systems  Positive: Unresponsive episode Negative: Weakness, paresthesias, headache, chest pain  Physical Exam  There were no vitals taken for this visit. Gen:   Awake, no distress   HEENT:  Atraumatic  Resp:  Normal effort  Cardiac:  Normal rate  Abd:   Nondistended, nontender  MSK:   Moves extremities without difficulty Neuro:  Speech soft, no facial droop, tongue midline equal handgrip bilaterally  Medical Decision Making  Medically screening exam initiated at 9:06 PM.  Appropriate orders placed.  Fred OSBORN Sr. was informed that the remainder of the evaluation will be completed by another provider, this initial triage assessment does not replace that evaluation, and the importance of remaining in the ED until their evaluation is complete.  Clinical Impression  Unresponsive episode   Saysha Menta A, PA-C 10/27/20 [id number]

## 2020-10-27 NOTE — ED Triage Notes (Signed)
Pt coming from home. Pt's family states they were not able to wake patient up for two hours. Pt denies any complaints at this time, is sitting up in wheelchair.

## 2020-10-28 LAB — TROPONIN I (HIGH SENSITIVITY): Troponin I (High Sensitivity): 7 ng/L (ref <18)

## 2020-10-28 MED ORDER — CEPHALEXIN 500 MG PO CAPS: 500.0000 mg | ORAL_CAPSULE | Freq: Two times a day (BID) | ORAL | 0 refills | Status: AC

## 2020-10-28 NOTE — ED Notes (Signed)
Pt discharged and wheeled out of the ED in a wheel chair without difficulty. 

## 2020-10-28 NOTE — ED Provider Notes (Signed)
[facility] Emergency Department Provider Note MRN:  [id number]  Arrival date & time: 10/28/20     Chief Complaint   Weakness   History of Present Illness   Fred MCFARREN Sr. is a 87 y.o. year-old male with a history of stroke, Parkinson's presenting to the ED with chief complaint of weakness.  Patient would not wake up today and this concerned wife and caregiver patient is currently awake and conversant and denies any complaints.  Wife denies any recent illness, has had a few ED visits for low heart rates but has been discharged.  Review of Systems  A complete 10 system review of systems was obtained and all systems are negative except as noted in the HPI and PMH.   Patient's Health History    Past Medical History:  Diagnosis Date  . Arthritis    fingers, neck  . Benign neoplasm of colon, unspecified   . Calculus of kidney   . Cancer of prostate [facility]) 12/21/2013   3/07 resection  . Cancer of skin   . Cerebrovascular accident (CVA) due to embolism of left anterior cerebral artery ([place]) 12/25/2015   Carotid negative, echo negative, right hand weakness, 6/17  . Chronic kidney disease, stage III (moderate) (HCC)   . Dementia ([place])   . Depression    unspecified  . Ganglion of tendon sheath   . GERD (gastroesophageal reflux disease)    rare  . History of urethral stricture    postoperative urethral stricture  . Hyperlipidemia, unspecified 12/21/2013  . Hypertension 12/31/2013   Echo s/p fall 11/15  . Kidney stone 12/21/2013  . Lumbar disc disease 12/21/2013  . Malignant neoplasm of prostate ([place])    08/2005 Gleason's  7 (3 4) left base, left mid medial, and left mid lateral. PSA 4.6 with a percent free PSA  . Melanoma ([place]) 12/18/2007   RUQ abdomen costal margin.  MM, superfiical spreading. Clark's level II, Breslow's 0.39mm.   . Melanoma in situ ([place]) 09/02/2008   Right lat. chin inf. to oral commisure. MMIS.  . Microscopic hematuria   .  Nonspecific finding on examination of urine   . Other hammer toe(s) (acquired), unspecified foot   . Squamous cell carcinoma of skin 04/04/2014   Left ear antihelix. SCCis, hypertrophic.  Marland Kitchen Squamous cell carcinoma of skin 05/30/2019   Left lat. elbow. WD SCC.   Marland Kitchen Stress incontinence, male   . Stroke ([place])   . Unilateral inguinal hernia, without obstruction or gangrene, not specified as recurrent    unilateral or unspecified  . Vascular disorder of penis     Past Surgical History:  Procedure Laterality Date  . CATARACT EXTRACTION W/PHACO Right 11/27/2014   Procedure: CATARACT EXTRACTION PHACO AND INTRAOCULAR LENS PLACEMENT (IOC);  Surgeon: Leandrew Koyanagi, MD;  Location: [facility];  Service: Ophthalmology;  Laterality: Right;  . COLONOSCOPY     with removal of colonic polyps  . EXTRACORPOREAL SHOCK WAVE LITHOTRIPSY Left 05/2012  . EYE SURGERY Left 09/25/14   cataract @[facility]   . HERNIA REPAIR  1999  . HERNIA REPAIR Bilateral 05/2008   Bilateral inguinal hernia repair --- Dr. Tamala Julian  . KIDNEY STONE SURGERY    . MELANOMA EXCISION  12/2007   melanoma resection  . RETROPUBIC PROSTATECTOMY  09/2005   Radical prostatectomy perineal - Dr. Jacqlyn Larsen  . WRIST GANGLION EXCISION Bilateral     Family History  Problem Relation Age of Onset  . Cancer Other   . Stroke Other   .  Heart attack Other   . Stroke Mother   . Cancer Mother   . Heart attack Mother   . Stroke Father   . Cancer Father   . Cancer Sister   . Prostate cancer Brother   . GU problems Neg Hx   . Kidney disease Neg Hx     Social History   Socioeconomic History  . Marital status: Married    Spouse name: Dot  . Number of children: 3  . Years of education: 16  . Highest education level: Bachelor's degree (e.g., BA, AB, BS)  Occupational History  . Not on file  Tobacco Use  . Smoking status: Never Smoker  . Smokeless tobacco: Never Used  Vaping Use  . Vaping Use: Never used  Substance and Sexual Activity  .  Alcohol use: Yes    Alcohol/week: 4.0 standard drinks    Types: 4 Glasses of wine per week    Comment: occasionally  . Drug use: No  . Sexual activity: Not Currently  Other Topics Concern  . Not on file  Social History Narrative  . Not on file   Social Determinants of Health   Financial Resource Strain: Not on file  Food Insecurity: Not on file  Transportation Needs: Not on file  Physical Activity: Not on file  Stress: Not on file  Social Connections: Not on file  Intimate Partner Violence: Not on file     Physical Exam   Vitals:   10/28/20 0145 10/28/20 0200  BP: 122/75 136/78  Pulse: (!) 50 (!) 51  Resp: 14 12  Temp:    SpO2: 100% 98%    CONSTITUTIONAL: Chronically ill-appearing, NAD NEURO: Resting but easily wakes, follows commands, oriented to name, moves all extremities equally EYES:  eyes equal and reactive ENT/NECK:  no LAD, no JVD CARDIO: Regular rate, well-perfused, normal S1 and S2 PULM:  CTAB no wheezing or rhonchi GI/GU:  normal bowel sounds, non-distended, non-tender MSK/SPINE:  No gross deformities, no edema SKIN:  no rash, atraumatic PSYCH:  Appropriate speech and behavior  *Additional and/or pertinent findings included in MDM below  Diagnostic and Interventional Summary    EKG Interpretation  Date/Time:  Monday October 27 2020 21:21:34 EDT Ventricular Rate:  57 PR Interval:  198 QRS Duration: 92 QT Interval:  440 QTC Calculation: 428 R Axis:   -64 Text Interpretation: Sinus bradycardia Pulmonary disease pattern Incomplete right bundle branch block Left anterior fascicular block Abnormal ECG Confirmed by Gerlene Fee [phone]) on 10/27/2020 11:41:04 PM      Labs Reviewed  CBC WITH DIFFERENTIAL/PLATELET - Abnormal; Notable for the following components:      Result Value   WBC 11.9 (*)    Neutro Abs 8.2 (*)    Basophils Absolute 0.3 (*)    Abs Immature Granulocytes 0.85 (*)    All other components within normal limits  COMPREHENSIVE  METABOLIC PANEL - Abnormal; Notable for the following components:   Glucose, Bld 108 (*)    Anion gap 4 (*)    All other components within normal limits  URINALYSIS, ROUTINE W REFLEX MICROSCOPIC  TROPONIN I (HIGH SENSITIVITY)  TROPONIN I (HIGH SENSITIVITY)    CT Head Wo Contrast  Final Result    DG Chest 2 View  Final Result      Medications - No data to display   Procedures  /  Critical Care Procedures  ED Course and Medical Decision Making  I have reviewed the triage vital signs, the nursing notes, and  pertinent available records from the EMR.  Listed above are laboratory and imaging tests that I personally ordered, reviewed, and interpreted and then considered in my medical decision making (see below for details).  Unclear cause of patient's inability to wake up earlier today.  Discussed with wife, when trying to wake him up earlier they were only talking to him, they did not try to shake him or pinch him or do anything else.  Considering metabolic disarray, UTI, CNS pathology, however work-up thus far is reassuring with CT head unchanged, labs normal.  Still awaiting urinalysis.  Could be that he was simply deeply asleep, medications have been changed recently.  And given his reassuring exam at this time, overall I doubt emergent process.     Patient continues to be at his baseline, easily wakes and converses.  Difficulty obtaining urine sample but without fever or suprapubic tenderness or dysuria I am most concerned for UTI.  Appropriate for discharge with PCP follow-up.  Barth Kirks. Sedonia Small, [place] [email or web address]  Final Clinical Impressions(s) / ED Diagnoses     ICD-10-CM   1. Weakness  R53.1     ED Discharge Orders         Ordered    cephALEXin (KEFLEX) 500 MG capsule  2 times daily        10/28/20 0224           Discharge Instructions Discussed with and Provided to Patient:     Discharge Instructions      You were evaluated in the Emergency Department and after careful evaluation, we did not find any emergent condition requiring admission or further testing in the hospital.  Your exam/testing today was overall reassuring.  Please return to the Emergency Department if you experience any worsening of your condition.  Thank you for allowing [place] to be a part of your care.        Maudie Flakes, MD 10/28/20 [phone]

## 2020-10-28 NOTE — ED Notes (Signed)
Attempted in and out cath, unable to advance catheter due to a stricture. Dr. Sedonia Small made aware.

## 2020-10-28 NOTE — Discharge Instructions (Addendum)
You were evaluated in the Emergency Department and after careful evaluation, we did not find any emergent condition requiring admission or further testing in the hospital.  Your exam/testing today was overall reassuring.  Please return to the Emergency Department if you experience any worsening of your condition.  Thank you for allowing us to be a part of your care.  

## 2020-11-11 DIAGNOSIS — R001 Bradycardia, unspecified: Secondary | ICD-10-CM | POA: Diagnosis not present

## 2020-11-17 DIAGNOSIS — D72823 Leukemoid reaction: Secondary | ICD-10-CM | POA: Diagnosis not present

## 2020-11-17 DIAGNOSIS — R5383 Other fatigue: Secondary | ICD-10-CM | POA: Diagnosis not present

## 2020-11-18 ENCOUNTER — Other Ambulatory Visit: Payer: Self-pay

## 2020-11-18 ENCOUNTER — Emergency Department: Payer: PPO

## 2020-11-18 ENCOUNTER — Emergency Department
Admission: EM | Admit: 2020-11-18 | Discharge: 2020-11-18 | Disposition: A | Discharge status: Home / Self Care | Payer: PPO | Attending: Student in an Organized Health Care Education/Training Program | Admitting: Student in an Organized Health Care Education/Training Program

## 2020-11-18 DIAGNOSIS — Z8546 Personal history of malignant neoplasm of prostate: Secondary | ICD-10-CM | POA: Diagnosis not present

## 2020-11-18 DIAGNOSIS — I129 Hypertensive chronic kidney disease with stage 1 through stage 4 chronic kidney disease, or unspecified chronic kidney disease: Secondary | ICD-10-CM | POA: Diagnosis not present

## 2020-11-18 DIAGNOSIS — Z7902 Long term (current) use of antithrombotics/antiplatelets: Secondary | ICD-10-CM | POA: Diagnosis not present

## 2020-11-18 DIAGNOSIS — F0280 Dementia in other diseases classified elsewhere without behavioral disturbance: Secondary | ICD-10-CM | POA: Insufficient documentation

## 2020-11-18 DIAGNOSIS — Z79899 Other long term (current) drug therapy: Secondary | ICD-10-CM | POA: Insufficient documentation

## 2020-11-18 DIAGNOSIS — J9811 Atelectasis: Secondary | ICD-10-CM | POA: Diagnosis not present

## 2020-11-18 DIAGNOSIS — R001 Bradycardia, unspecified: Secondary | ICD-10-CM | POA: Insufficient documentation

## 2020-11-18 DIAGNOSIS — Z20822 Contact with and (suspected) exposure to covid-19: Secondary | ICD-10-CM | POA: Insufficient documentation

## 2020-11-18 DIAGNOSIS — Z85038 Personal history of other malignant neoplasm of large intestine: Secondary | ICD-10-CM | POA: Diagnosis not present

## 2020-11-18 DIAGNOSIS — G20 Parkinson's disease: Secondary | ICD-10-CM | POA: Insufficient documentation

## 2020-11-18 DIAGNOSIS — E039 Hypothyroidism, unspecified: Secondary | ICD-10-CM | POA: Insufficient documentation

## 2020-11-18 DIAGNOSIS — Z85828 Personal history of other malignant neoplasm of skin: Secondary | ICD-10-CM | POA: Diagnosis not present

## 2020-11-18 DIAGNOSIS — N183 Chronic kidney disease, stage 3 unspecified: Secondary | ICD-10-CM | POA: Insufficient documentation

## 2020-11-18 DIAGNOSIS — K5730 Diverticulosis of large intestine without perforation or abscess without bleeding: Secondary | ICD-10-CM | POA: Diagnosis not present

## 2020-11-18 DIAGNOSIS — R109 Unspecified abdominal pain: Secondary | ICD-10-CM | POA: Diagnosis not present

## 2020-11-18 LAB — CBC
HCT: 43 % (ref 39.0–52.0)
Hemoglobin: 14.4 g/dL (ref 13.0–17.0)
MCH: 32.1 pg (ref 26.0–34.0)
MCHC: 33.5 g/dL (ref 30.0–36.0)
MCV: 95.8 fL (ref 80.0–100.0)
Platelets: 256 10*3/uL (ref 150–400)
RBC: 4.49 MIL/uL (ref 4.22–5.81)
RDW: 14.2 % (ref 11.5–15.5)
WBC: 14.3 10*3/uL — ABNORMAL HIGH (ref 4.0–10.5)
nRBC: 0 % (ref 0.0–0.2)

## 2020-11-18 LAB — BASIC METABOLIC PANEL
Anion gap: 6 (ref 5–15)
BUN: 25 mg/dL — ABNORMAL HIGH (ref 8–23)
CO2: 28 mmol/L (ref 22–32)
Calcium: 10.2 mg/dL (ref 8.9–10.3)
Chloride: 107 mmol/L (ref 98–111)
Creatinine, Ser: 1.58 mg/dL — ABNORMAL HIGH (ref 0.61–1.24)
GFR, Estimated: 42 mL/min — ABNORMAL LOW (ref >60)
Glucose, Bld: 105 mg/dL — ABNORMAL HIGH (ref 70–99)
Potassium: 4 mmol/L (ref 3.5–5.1)
Sodium: 141 mmol/L (ref 135–145)

## 2020-11-18 LAB — HEPATIC FUNCTION PANEL
ALT: 18 U/L (ref 0–44)
AST: 35 U/L (ref 15–41)
Albumin: 3.8 g/dL (ref 3.5–5.0)
Alkaline Phosphatase: 75 U/L (ref 38–126)
Bilirubin, Direct: <0.1 mg/dL (ref 0.0–0.2)
Total Bilirubin: 0.8 mg/dL (ref 0.3–1.2)
Total Protein: 6.4 g/dL — ABNORMAL LOW (ref 6.5–8.1)

## 2020-11-18 LAB — LACTIC ACID, PLASMA: Lactic Acid, Venous: 1.3 mmol/L (ref 0.5–1.9)

## 2020-11-18 LAB — LIPASE, BLOOD: Lipase: 32 U/L (ref 11–51)

## 2020-11-18 LAB — TROPONIN I (HIGH SENSITIVITY)
Troponin I (High Sensitivity): 43 ng/L — ABNORMAL HIGH (ref <18)
Troponin I (High Sensitivity): 49 ng/L — ABNORMAL HIGH (ref <18)

## 2020-11-18 LAB — RESP PANEL BY RT-PCR (FLU A&B, COVID) ARPGX2
Influenza A by PCR: NEGATIVE
Influenza B by PCR: NEGATIVE
SARS Coronavirus 2 by RT PCR: NEGATIVE

## 2020-11-18 MED ORDER — IOHEXOL 300 MG/ML  SOLN
75.0000 mL | Freq: Once | INTRAMUSCULAR | Status: AC | PRN
  Administered 2020-11-18: 75 mL via INTRAVENOUS
  Filled 2020-11-18: qty 75

## 2020-11-18 MED ORDER — POLYETHYLENE GLYCOL 3350 17 G PO PACK: 17.0000 g | PACK | Freq: Every day | ORAL | 0 refills | Status: AC

## 2020-11-18 MED ORDER — SODIUM CHLORIDE 0.9 % IV BOLUS
500.0000 mL | Freq: Once | INTRAVENOUS | Status: AC
  Administered 2020-11-18: 500 mL via INTRAVENOUS

## 2020-11-18 NOTE — ED Provider Notes (Signed)
[facility] Provider Note    Event Date/Time   First MD Initiated Contact with Patient 11/18/20 [name]     (approximate)  I have reviewed the triage vital signs and the nursing notes.   HISTORY  Chief Complaint Bradycardia    HPI Fred MULHEARN Sr. is a 87 y.o. male the below listed past medical history presents to the ER for evaluation of episode of abdominal pain earlier this morning as well as concern for bradycardia.  Patient a poor historian here with his s.  He denies any pain or discomfort at this time.  No new medication changes.  Does not feel short of breath.    Past Medical History:  Diagnosis Date  . Arthritis    fingers, neck  . Benign neoplasm of colon, unspecified   . Calculus of kidney   . Cancer of prostate [facility]) 12/21/2013   3/07 resection  . Cancer of skin   . Cerebrovascular accident (CVA) due to embolism of left anterior cerebral artery ([place]) 12/25/2015   Carotid negative, echo negative, right hand weakness, 6/17  . Chronic kidney disease, stage III (moderate) (HCC)   . Dementia ([place])   . Depression    unspecified  . Ganglion of tendon sheath   . GERD (gastroesophageal reflux disease)    rare  . History of urethral stricture    postoperative urethral stricture  . Hyperlipidemia, unspecified 12/21/2013  . Hypertension 12/31/2013   Echo s/p fall 11/15  . Kidney stone 12/21/2013  . Lumbar disc disease 12/21/2013  . Malignant neoplasm of prostate ([place])    08/2005 Gleason's  7 (3 4) left base, left mid medial, and left mid lateral. PSA 4.6 with a percent free PSA  . Melanoma ([place]) 12/18/2007   RUQ abdomen costal margin.  MM, superfiical spreading. Clark's level II, Breslow's 0.47mm.   . Melanoma in situ ([place]) 09/02/2008   Right lat. chin inf. to oral commisure. MMIS.  . Microscopic hematuria   . Nonspecific finding on examination of urine   . Other hammer toe(s) (acquired), unspecified foot   . Squamous  cell carcinoma of skin 04/04/2014   Left ear antihelix. SCCis, hypertrophic.  Marland Kitchen Squamous cell carcinoma of skin 05/30/2019   Left lat. elbow. WD SCC.   Marland Kitchen Stress incontinence, male   . Stroke ([place])   . Unilateral inguinal hernia, without obstruction or gangrene, not specified as recurrent    unilateral or unspecified  . Vascular disorder of penis    Family History  Problem Relation Age of Onset  . Cancer Other   . Stroke Other   . Heart attack Other   . Stroke Mother   . Cancer Mother   . Heart attack Mother   . Stroke Father   . Cancer Father   . Cancer Sister   . Prostate cancer Brother   . GU problems Neg Hx   . Kidney disease Neg Hx    Past Surgical History:  Procedure Laterality Date  . CATARACT EXTRACTION W/PHACO Right 11/27/2014   Procedure: CATARACT EXTRACTION PHACO AND INTRAOCULAR LENS PLACEMENT (IOC);  Surgeon: Leandrew Koyanagi, MD;  Location: [facility];  Service: Ophthalmology;  Laterality: Right;  . COLONOSCOPY     with removal of colonic polyps  . EXTRACORPOREAL SHOCK WAVE LITHOTRIPSY Left 05/2012  . EYE SURGERY Left 09/25/14   cataract @[facility]   . HERNIA REPAIR  1999  . HERNIA REPAIR Bilateral 05/2008   Bilateral inguinal hernia repair --- Dr.  Addison    . MELANOMA EXCISION  12/2007   melanoma resection  . RETROPUBIC PROSTATECTOMY  09/2005   Radical prostatectomy perineal - Dr. Jacqlyn Larsen  . WRIST GANGLION EXCISION Bilateral    Patient Active Problem List   Diagnosis Date Noted  . Other nonthrombocytopenic purpura ([place]) 10/23/2020  . Stroke ([place]) 08/11/2019  . Acute CVA (cerebrovascular accident) ([place]) 08/10/2019  . Acquired hypothyroidism 08/10/2019  . Parkinson's disease ([place]) 08/10/2019  . History of CVA (cerebrovascular accident) 08/10/2019  . CAP (community acquired pneumonia) 07/23/2017  . Weakness 07/07/2016  . Stroke (cerebrum) ([place]) 12/20/2015  . HTN (hypertension) 12/20/2015  . GERD (gastroesophageal reflux disease)  12/20/2015  . Depression 12/20/2015      Prior to Admission medications   Medication Sig Start Date End Date Taking? Authorizing Provider  polyethylene glycol (MIRALAX / GLYCOLAX) 17 g packet Take 17 g by mouth daily. Mix one tablespoon with 8oz of your favorite juice or water every day until you are having soft formed stools. Then start taking once daily if you didn't have a stool the day before. 11/18/20  Yes Merlyn Lot, MD  Acetaminophen 325 MG CAPS Take 325 mg by mouth daily as needed.    [provider]  atorvastatin (LIPITOR) 10 MG tablet Take 2 tablets (20 mg total) by mouth daily. This is an increase from 10 mg daily. 08/13/19 09/12/19  Enzo Bi, MD  carbidopa-levodopa (SINEMET CR) 50-200 MG tablet Take 1 tablet by mouth at bedtime. 07/12/19   [provider]  carbidopa-levodopa (SINEMET IR) 25-250 MG tablet Take 1 tablet by mouth 3 (three) times daily.  06/14/16   [provider]  Cholecalciferol (VITAMIN D3) 2000 units capsule Take 1 capsule by mouth daily.    [provider]  clopidogrel (PLAVIX) 75 MG tablet Take 75 mg by mouth daily.    [provider]  divalproex (DEPAKOTE) 125 MG DR tablet Take 125 mg by mouth 3 (three) times daily as needed. 09/30/20   [provider]  donepezil (ARICEPT) 10 MG tablet Take 10 mg by mouth at bedtime. 08/12/20   [provider]  DULoxetine (CYMBALTA) 20 MG capsule Take 20 mg by mouth 2 (two) times daily. 10/02/20   [provider]  levothyroxine (SYNTHROID, LEVOTHROID) 75 MCG tablet Take 75 mcg by mouth daily.  03/02/17   [provider]  magnesium oxide (MAG-OX) 400 MG tablet Take 400 mg by mouth.    [provider]  memantine (NAMENDA) 5 MG tablet Take 5 mg by mouth 2 (two) times daily. 10/16/20   [provider]  Multiple Vitamin (MULTI-VITAMIN) tablet Take 1 tablet by mouth daily.    [provider]  omeprazole (PRILOSEC) 40 MG capsule  Take 40 mg by mouth daily. 06/11/19   [provider]  QUEtiapine (SEROQUEL) 25 MG tablet Take 25-50 mg by mouth at bedtime. 09/04/20   [provider]  sertraline (ZOLOFT) 50 MG tablet Take 50 mg by mouth daily. 03/21/18   [provider]  vitamin B-12 (CYANOCOBALAMIN) 500 MCG tablet Take 500 mcg by mouth daily.    [provider]    Allergies Patient has no known allergies.    Social History Social History   Tobacco Use  . Smoking status: Never Smoker  . Smokeless tobacco: Never Used  Vaping Use  . Vaping Use: Never used  Substance Use Topics  . Alcohol use: Yes    Alcohol/week: 4.0 standard drinks  Types: 4 Glasses of wine per week    Comment: occasionally  . Drug use: No    Review of Systems Patient denies headaches, rhinorrhea, blurry vision, numbness, shortness of breath, chest pain, edema, cough, abdominal pain, nausea, vomiting, diarrhea, dysuria, fevers, rashes or hallucinations unless otherwise stated above in HPI. ____________________________________________   PHYSICAL EXAM:  VITAL SIGNS: Vitals:   11/18/20 2000 11/18/20 2053  BP: 121/69 (!) 118/94  Pulse: 60   Resp:  18  Temp:    SpO2: 100%     Constitutional: Alert and oriented. Frail appearing in NAD Eyes: Conjunctivae are normal.  Head: Atraumatic. Nose: No congestion/rhinnorhea. Mouth/Throat: Mucous membranes are moist.   Neck: No stridor. Painless ROM.  Cardiovascular: Normal rate, regular rhythm. Grossly normal heart sounds.  Good peripheral circulation. Respiratory: Normal respiratory effort.  No retractions. Lungs CTAB. Gastrointestinal: Soft and nontender. No distention. No abdominal bruits. No CVA tenderness. Genitourinary: deferred Musculoskeletal: No lower extremity tenderness nor edema.  No joint effusions. Neurologic:  Normal speech and language. No gross focal neurologic deficits are appreciated.  Skin:  Skin is warm, dry and intact. No rash  noted. Psychiatric: Mood and affect are normal. Speech and behavior are normal.  ____________________________________________   LABS (all labs ordered are listed, but only abnormal results are displayed)  Results for orders placed or performed during the [facility] encounter of 11/18/20 (from the past 24 hour(s))  Basic metabolic panel     Status: Abnormal   Collection Time: 11/18/20  5:13 PM  Result Value Ref Range   Sodium 141 135 - 145 mmol/L   Potassium 4.0 3.5 - 5.1 mmol/L   Chloride 107 98 - 111 mmol/L   CO2 28 22 - 32 mmol/L   Glucose, Bld 105 (H) 70 - 99 mg/dL   BUN 25 (H) 8 - 23 mg/dL   Creatinine, Ser 1.58 (H) 0.61 - 1.24 mg/dL   Calcium 10.2 8.9 - 10.3 mg/dL   GFR, Estimated 42 (L) >60 mL/min   Anion gap 6 5 - 15  CBC     Status: Abnormal   Collection Time: 11/18/20  5:13 PM  Result Value Ref Range   WBC 14.3 (H) 4.0 - 10.5 K/uL   RBC 4.49 4.22 - 5.81 MIL/uL   Hemoglobin 14.4 13.0 - 17.0 g/dL   HCT 43.0 39.0 - 52.0 %   MCV 95.8 80.0 - 100.0 fL   MCH 32.1 26.0 - 34.0 pg   MCHC 33.5 30.0 - 36.0 g/dL   RDW 14.2 11.5 - 15.5 %   Platelets 256 150 - 400 K/uL   nRBC 0.0 0.0 - 0.2 %  Troponin I (High Sensitivity)     Status: Abnormal   Collection Time: 11/18/20  5:13 PM  Result Value Ref Range   Troponin I (High Sensitivity) 43 (H) <18 ng/L  Hepatic function panel     Status: Abnormal   Collection Time: 11/18/20  5:13 PM  Result Value Ref Range   Total Protein 6.4 (L) 6.5 - 8.1 g/dL   Albumin 3.8 3.5 - 5.0 g/dL   AST 35 15 - 41 U/L   ALT 18 0 - 44 U/L   Alkaline Phosphatase 75 38 - 126 U/L   Total Bilirubin 0.8 0.3 - 1.2 mg/dL   Bilirubin, Direct <0.1 0.0 - 0.2 mg/dL   Indirect Bilirubin NOT CALCULATED 0.3 - 0.9 mg/dL  Lipase, blood     Status: None   Collection Time: 11/18/20  5:13 PM  Result Value Ref  Range   Lipase 32 11 - 51 U/L  Resp Panel by RT-PCR (Flu A&B, Covid) Nasopharyngeal Swab     Status: None   Collection Time: 11/18/20  6:49 PM   Specimen:  Nasopharyngeal Swab; Nasopharyngeal(NP) swabs in vial transport medium  Result Value Ref Range   SARS Coronavirus 2 by RT PCR NEGATIVE NEGATIVE   Influenza A by PCR NEGATIVE NEGATIVE   Influenza B by PCR NEGATIVE NEGATIVE  Lactic acid, plasma     Status: None   Collection Time: 11/18/20  6:49 PM  Result Value Ref Range   Lactic Acid, Venous 1.3 0.5 - 1.9 mmol/L  Troponin I (High Sensitivity)     Status: Abnormal   Collection Time: 11/18/20  6:49 PM  Result Value Ref Range   Troponin I (High Sensitivity) 49 (H) <18 ng/L   ____________________________________________  EKG My review and personal interpretation at Time: 17:11   Indication: bradycardia  Rate: 60  Rhythm: sinus Axis: left Other: poor r wave progression, no stemi or depressions ____________________________________________  RADIOLOGY  I personally reviewed all radiographic images ordered to evaluate for the above acute complaints and reviewed radiology reports and findings.  These findings were personally discussed with the patient.  Please see medical record for radiology report.  ____________________________________________   PROCEDURES  Procedure(s) performed:  Procedures    Critical Care performed: no ____________________________________________   INITIAL IMPRESSION / ASSESSMENT AND PLAN / ED COURSE  Pertinent labs & imaging results that were available during my care of the patient were reviewed by me and considered in my medical decision making (see chart for details).   DDX: Enteritis, SBO, electrolyte abnormality, dehydration, ACS, pneumonia, COVID  Fred Cronister. is a 87 y.o. who presents to the ED with presentation as described above.  Describes some abdominal pain earlier this morning also concern for low heart rate which has been seen multiple times for in the past.  Denies any chest pain or pressure.  Given his age and risk factors blood work and imaging will be ordered for the but differential.   Denies any chest pain or pressure at this time.  He is not hypoxic.  No cough or recent fevers  Clinical Course as of 11/18/20 2057  Tue Nov 18, 2020  2045 Reassessed.  Remains nontoxic-appearing denies any chest pain or pressure.  Troponin stable.  Suspect mild dehydration contributing to elevated troponin.  EKG nonischemic.  Is a sinus rhythm.  He is not having any bradycardia since being here is not on any beta-blockers.  I suspect this is secondary to his Parkinson disease.  I discussed this with the patient and the wife at bedside discussed option for observation the [facility] which they have declined saying that they feel well improved would prefer to follow-up with cardiology which I also think is reasonable given his presentation.  Discussed strict return precautions. [PR]    Clinical Course User Index [PR] Merlyn Lot, MD    The patient was evaluated in Emergency Department today for the symptoms described in the history of present illness. He/she was evaluated in the context of the global COVID-19 pandemic, which necessitated consideration that the patient might be at risk for infection with the SARS-CoV-2 virus that causes COVID-19. Institutional protocols and algorithms that pertain to the evaluation of patients at risk for COVID-19 are in a state of rapid change based on information released by regulatory bodies including the CDC and federal and state organizations. These policies and algorithms were followed  during the patient's care in the ED.  As part of my medical decision making, I reviewed the following data within the [facility] notes reviewed and incorporated, Labs reviewed, notes from prior ED visits and [place] Controlled Substance Database   ____________________________________________   FINAL CLINICAL IMPRESSION(S) / ED DIAGNOSES  Final diagnoses:  Bradycardia      NEW MEDICATIONS STARTED DURING THIS VISIT:  Discharge Medication List as of  11/18/2020  8:48 PM    START taking these medications   Details  polyethylene glycol (MIRALAX / GLYCOLAX) 17 g packet Take 17 g by mouth daily. Mix one tablespoon with 8oz of your favorite juice or water every day until you are having soft formed stools. Then start taking once daily if you didn't have a stool the day before., Starting Tue 11/18/2020, Print         Note:  This document was prepared using Dragon voice recognition software and may include unintentional dictation errors.    Merlyn Lot, MD 11/18/20 [phone]

## 2020-11-18 NOTE — Discharge Instructions (Addendum)
Follow up with Dr. Ubaldo Glassing in the AM.  Return for any chest pain, tightness, shortness of breath or weakness.

## 2020-11-18 NOTE — ED Triage Notes (Signed)
Pt comes from [facility] with c/o bradycardia. Wife reports they went to [facility] to check on pt's belly pain. Wife states pt's HR was reading in 71s.  Pt denies any complaints.

## 2020-11-24 DIAGNOSIS — R001 Bradycardia, unspecified: Secondary | ICD-10-CM | POA: Diagnosis not present

## 2020-11-24 DIAGNOSIS — G4733 Obstructive sleep apnea (adult) (pediatric): Secondary | ICD-10-CM | POA: Diagnosis not present

## 2020-11-24 DIAGNOSIS — F330 Major depressive disorder, recurrent, mild: Secondary | ICD-10-CM | POA: Diagnosis not present

## 2020-11-24 DIAGNOSIS — E782 Mixed hyperlipidemia: Secondary | ICD-10-CM | POA: Diagnosis not present

## 2020-11-24 DIAGNOSIS — I10 Essential (primary) hypertension: Secondary | ICD-10-CM | POA: Diagnosis not present

## 2020-11-24 DIAGNOSIS — Z9989 Dependence on other enabling machines and devices: Secondary | ICD-10-CM | POA: Diagnosis not present

## 2020-11-24 DIAGNOSIS — Z8546 Personal history of malignant neoplasm of prostate: Secondary | ICD-10-CM | POA: Diagnosis not present

## 2020-12-01 ENCOUNTER — Other Ambulatory Visit: Payer: Self-pay

## 2020-12-01 ENCOUNTER — Ambulatory Visit: Payer: PPO | Admitting: Dermatology

## 2020-12-01 ENCOUNTER — Encounter: Payer: Self-pay | Admitting: Dermatology

## 2020-12-01 ENCOUNTER — Telehealth: Payer: Self-pay

## 2020-12-01 DIAGNOSIS — L719 Rosacea, unspecified: Secondary | ICD-10-CM | POA: Diagnosis not present

## 2020-12-01 DIAGNOSIS — L821 Other seborrheic keratosis: Secondary | ICD-10-CM | POA: Diagnosis not present

## 2020-12-01 DIAGNOSIS — L570 Actinic keratosis: Secondary | ICD-10-CM

## 2020-12-01 DIAGNOSIS — L578 Other skin changes due to chronic exposure to nonionizing radiation: Secondary | ICD-10-CM | POA: Diagnosis not present

## 2020-12-01 DIAGNOSIS — D692 Other nonthrombocytopenic purpura: Secondary | ICD-10-CM

## 2020-12-01 DIAGNOSIS — L820 Inflamed seborrheic keratosis: Secondary | ICD-10-CM | POA: Diagnosis not present

## 2020-12-01 NOTE — Telephone Encounter (Signed)
Clinical palliative care call to check on patient.  Pt scheduled for pacemaker insertion on 5/24 due to bradycardia. Follow up appt scheduled with NP Amy Olena Heckle (palliative care) post pacemaker insertion

## 2020-12-01 NOTE — Progress Notes (Signed)
Follow-Up Visit   Subjective  [facility]. is a 87 y.o. male who presents for the following: Skin Problem (Check a growth on the right wrist ) Check red places on his face.  He has rosacea and would like to continue treatment for this.  Still gets pimples occasionally.  There are other areas to be evaluated.  The patient has memory loss issues.  Wife with patient and she contributes to history.  The patient has had some memory loss issues.  Personal health/aide is present with him also.  The following portions of the chart were reviewed this encounter and updated as appropriate:   Tobacco  Allergies  Meds  Problems  Med Hx  Surg Hx  Fam Hx      Review of Systems:  No other skin or systemic complaints except as noted in HPI or Assessment and Plan.  Objective  Well appearing patient in no apparent distress; mood and affect are within normal limits.  A focused examination was performed including face, hands, arms . Relevant physical exam findings are noted in the Assessment and Plan.  Objective  Right dorsum wrist: Erythematous keratotic or waxy stuck-on papule or plaque.   Objective  face: Mid face erythema with telangiectasias +/- scattered inflammatory papules.   Objective  Scalp: Erythematous thin papules/macules with gritty scale.    Assessment & Plan  Inflamed seborrheic keratosis Right dorsum wrist  If not gone in 4 weeks return to the clinic for biopsy   Destruction of lesion - Right dorsum wrist Complexity: simple   Destruction method: cryotherapy   Informed consent: discussed and consent obtained   Timeout:  patient name, date of birth, surgical site, and procedure verified Lesion destroyed using liquid nitrogen: Yes   Region frozen until ice ball extended beyond lesion: Yes   Outcome: patient tolerated procedure well with no complications   Post-procedure details: wound care instructions given    Rosacea face  Rosacea is a chronic  progressive skin condition usually affecting the face of adults, causing redness and/or acne bumps. It is treatable but not curable. It sometimes affects the eyes (ocular rosacea) as well. It may respond to topical and/or systemic medication and can flare with stress, sun exposure, alcohol, exercise and some foods.  Daily application of broad spectrum spf 30+ sunscreen to face is recommended to reduce flares.   Start [facility] Rosacea triple cream apply to face daily Email;   AK (actinic keratosis) Scalp  Destruction of lesion - Scalp Complexity: simple   Destruction method: cryotherapy   Informed consent: discussed and consent obtained   Timeout:  patient name, date of birth, surgical site, and procedure verified Lesion destroyed using liquid nitrogen: Yes   Region frozen until ice ball extended beyond lesion: Yes   Outcome: patient tolerated procedure well with no complications   Post-procedure details: wound care instructions given     Actinic Damage - chronic, secondary to cumulative UV radiation exposure/sun exposure over time - diffuse scaly erythematous macules with underlying dyspigmentation - Recommend daily broad spectrum sunscreen SPF 30+ to sun-exposed areas, reapply every 2 hours as needed.  - Recommend staying in the shade or wearing long sleeves, sun glasses (UVA+UVB protection) and wide brim hats (4-inch brim around the entire circumference of the hat). - Call for new or changing lesions.  Purpura - Chronic; persistent and recurrent.  Treatable, but not curable. - Violaceous macules and patches - Benign - Related to trauma, age, sun damage and/or use of  blood thinners, chronic use of topical and/or oral steroids - Observe - Can use OTC arnica containing moisturizer such as Dermend Bruise Formula if desired - Call for worsening or other concerns  Seborrheic Keratoses - Stuck-on, waxy, tan-brown papules and/or plaques  - Benign-appearing - Discussed benign  etiology and prognosis. - Observe - Call for any changes  Return in about 1 month (around 01/01/2021) for Aks scalp .  IMarye Round, CMA, am acting as scribe for Sarina Ser, MD .  Documentation: I have reviewed the above documentation for accuracy and completeness, and I agree with the above.  Sarina Ser, MD

## 2020-12-01 NOTE — Patient Instructions (Addendum)
Cryotherapy Aftercare  . Wash gently with soap and water everyday.   Marland Kitchen Apply Vaseline and Band-Aid daily until healed.    If you have any questions or concerns for your doctor, please call our main line at [phone] and press option 4 to reach your doctor's medical assistant. If no one answers, please leave a voicemail as directed and we will return your call as soon as possible. Messages left after 4 pm will be answered the following business day.   You may also send [place] a message via [place]. We typically respond to MyChart messages within 1-2 business days.  For prescription refills, please ask your pharmacy to contact our office. Our fax number is [phone].  If you have an urgent issue when the clinic is closed that cannot wait until the next business day, you can page your doctor at the number below.    Please note that while we do our best to be available for urgent issues outside of office hours, we are not available 24/7.   If you have an urgent issue and are unable to reach [place], you may choose to seek medical care at your doctor's office, retail clinic, urgent care center, or emergency room.  If you have a medical emergency, please immediately call 911 or go to the emergency department.  Pager Numbers  - Dr. Nehemiah Massed: [phone]  - Dr. Laurence Ferrari: [phone]  - Dr. Nicole Kindred: [phone]  In the event of inclement weather, please call our main line at [phone] for an update on the status of any delays or closures.  Dermatology Medication Tips: Please keep the boxes that topical medications come in in order to help keep track of the instructions about where and how to use these. Pharmacies typically print the medication instructions only on the boxes and not directly on the medication tubes.   If your medication is too expensive, please contact our office at [phone] option 4 or send [place] a message through [place].   We are unable to tell what your co-pay for  medications will be in advance as this is different depending on your insurance coverage. However, we may be able to find a substitute medication at lower cost or fill out paperwork to get insurance to cover a needed medication.   If a prior authorization is required to get your medication covered by your insurance company, please allow [place] 1-2 business days to complete this process.  Drug prices often vary depending on where the prescription is filled and some pharmacies may offer cheaper prices.  The website www.goodrx.com contains coupons for medications through different pharmacies. The prices here do not account for what the cost may be with help from insurance (it may be cheaper with your insurance), but the website can give you the price if you did not use any insurance.  - You can print the associated coupon and take it with your prescription to the pharmacy.  - You may also stop by our office during regular business hours and pick up a GoodRx coupon card.  - If you need your prescription sent electronically to a different pharmacy, notify our office through [facility] or by phone at [phone] option 4.        Instructions for [facility] Medications  email cream   Rosacea triple cream  One or more of your medications was sent to the [facility] mail order compounding pharmacy. You will receive an email from them and can purchase the medicine through that link.  It will then be mailed to your home at the address you confirmed. If for any reason you do not receive an email from them, please check your spam folder. If you still do not find the email, please let us know. [facility] phone number is [phone].

## 2020-12-09 ENCOUNTER — Encounter: Payer: Self-pay | Admitting: Cardiology

## 2020-12-09 ENCOUNTER — Encounter
Admission: RE | Disposition: A | Discharge status: Home / Self Care | Payer: Self-pay | Source: Home / Self Care | Attending: Cardiology

## 2020-12-09 ENCOUNTER — Other Ambulatory Visit: Payer: Self-pay

## 2020-12-09 ENCOUNTER — Encounter: Payer: Self-pay | Admitting: Dermatology

## 2020-12-09 ENCOUNTER — Observation Stay
Admission: RE | Admit: 2020-12-09 | Discharge: 2020-12-10 | Disposition: A | Discharge status: Home / Self Care | Payer: PPO | Attending: Cardiology | Admitting: Cardiology

## 2020-12-09 DIAGNOSIS — I495 Sick sinus syndrome: Principal | ICD-10-CM | POA: Diagnosis present

## 2020-12-09 DIAGNOSIS — Z8546 Personal history of malignant neoplasm of prostate: Secondary | ICD-10-CM | POA: Insufficient documentation

## 2020-12-09 DIAGNOSIS — Z006 Encounter for examination for normal comparison and control in clinical research program: Secondary | ICD-10-CM | POA: Diagnosis not present

## 2020-12-09 DIAGNOSIS — Z85038 Personal history of other malignant neoplasm of large intestine: Secondary | ICD-10-CM | POA: Insufficient documentation

## 2020-12-09 DIAGNOSIS — R001 Bradycardia, unspecified: Secondary | ICD-10-CM | POA: Diagnosis not present

## 2020-12-09 DIAGNOSIS — I10 Essential (primary) hypertension: Secondary | ICD-10-CM | POA: Insufficient documentation

## 2020-12-09 DIAGNOSIS — Z79899 Other long term (current) drug therapy: Secondary | ICD-10-CM | POA: Insufficient documentation

## 2020-12-09 HISTORY — PX: PACEMAKER LEADLESS INSERTION: EP1219

## 2020-12-09 SURGERY — PACEMAKER LEADLESS INSERTION
Anesthesia: Moderate Sedation

## 2020-12-09 MED ORDER — MIDAZOLAM HCL 2 MG/2ML IJ SOLN
INTRAMUSCULAR | Status: DC | PRN
  Administered 2020-12-09 (×2): 0.5 mg via INTRAVENOUS

## 2020-12-09 MED ORDER — ATORVASTATIN CALCIUM 10 MG PO TABS: 10.0000 mg | ORAL_TABLET | Freq: Every day | ORAL | Status: DC

## 2020-12-09 MED ORDER — FENTANYL CITRATE (PF) 100 MCG/2ML IJ SOLN
INTRAMUSCULAR | Status: AC
  Filled 2020-12-09: qty 2

## 2020-12-09 MED ORDER — MEMANTINE HCL 5 MG PO TABS
5.0000 mg | ORAL_TABLET | Freq: Two times a day (BID) | ORAL | Status: DC
  Administered 2020-12-10 (×2): 5 mg via ORAL
  Filled 2020-12-09 (×4): qty 1

## 2020-12-09 MED ORDER — LEVOTHYROXINE SODIUM 75 MCG PO TABS
75.0000 ug | ORAL_TABLET | Freq: Every day | ORAL | Status: DC
  Administered 2020-12-10: 75 ug via ORAL
  Filled 2020-12-09: qty 1

## 2020-12-09 MED ORDER — HEPARIN SODIUM (PORCINE) 1000 UNIT/ML IJ SOLN
INTRAMUSCULAR | Status: DC | PRN
  Administered 2020-12-09: 4000 [IU] via INTRAVENOUS

## 2020-12-09 MED ORDER — SODIUM CHLORIDE 0.9 % IV SOLN: 250.0000 mL | INTRAVENOUS | Status: DC | PRN

## 2020-12-09 MED ORDER — SODIUM CHLORIDE 0.9 % IV SOLN
80.0000 mg | INTRAVENOUS | Status: DC
  Filled 2020-12-09: qty 2

## 2020-12-09 MED ORDER — SODIUM CHLORIDE 0.9 % IV SOLN: INTRAVENOUS | Status: DC

## 2020-12-09 MED ORDER — SODIUM CHLORIDE 0.9% FLUSH: 3.0000 mL | INTRAVENOUS | Status: DC | PRN

## 2020-12-09 MED ORDER — CEFAZOLIN SODIUM-DEXTROSE 2-4 GM/100ML-% IV SOLN: 2.0000 g | INTRAVENOUS | Status: DC

## 2020-12-09 MED ORDER — ONDANSETRON HCL 4 MG/2ML IJ SOLN: 4.0000 mg | Freq: Four times a day (QID) | INTRAMUSCULAR | Status: DC | PRN

## 2020-12-09 MED ORDER — HEPARIN SODIUM (PORCINE) 1000 UNIT/ML IJ SOLN
INTRAMUSCULAR | Status: AC
  Filled 2020-12-09: qty 1

## 2020-12-09 MED ORDER — DONEPEZIL HCL 5 MG PO TABS
10.0000 mg | ORAL_TABLET | Freq: Every day | ORAL | Status: DC
  Administered 2020-12-10: 10 mg via ORAL
  Filled 2020-12-09 (×2): qty 2

## 2020-12-09 MED ORDER — DULOXETINE HCL 20 MG PO CPEP
20.0000 mg | ORAL_CAPSULE | Freq: Two times a day (BID) | ORAL | Status: DC
  Administered 2020-12-10 (×2): 20 mg via ORAL
  Filled 2020-12-09 (×4): qty 1

## 2020-12-09 MED ORDER — HEPARIN (PORCINE) IN NACL 1000-0.9 UT/500ML-% IV SOLN
INTRAVENOUS | Status: AC
  Filled 2020-12-09: qty 1000

## 2020-12-09 MED ORDER — MIDAZOLAM HCL 2 MG/2ML IJ SOLN
INTRAMUSCULAR | Status: AC
  Filled 2020-12-09: qty 2

## 2020-12-09 MED ORDER — ACETAMINOPHEN 325 MG PO TABS: 650.0000 mg | ORAL_TABLET | ORAL | Status: DC | PRN

## 2020-12-09 MED ORDER — HEPARIN (PORCINE) IN NACL 2000-0.9 UNIT/L-% IV SOLN
INTRAVENOUS | Status: DC | PRN
  Administered 2020-12-09: 1000 mL

## 2020-12-09 MED ORDER — SODIUM CHLORIDE 0.9% FLUSH
3.0000 mL | Freq: Two times a day (BID) | INTRAVENOUS | Status: DC
  Administered 2020-12-10: 3 mL via INTRAVENOUS

## 2020-12-09 MED ORDER — FENTANYL CITRATE (PF) 100 MCG/2ML IJ SOLN
INTRAMUSCULAR | Status: DC | PRN
  Administered 2020-12-09 (×2): 25 ug via INTRAVENOUS

## 2020-12-09 MED ORDER — IOHEXOL 300 MG/ML  SOLN
INTRAMUSCULAR | Status: DC | PRN
  Administered 2020-12-09: 10 mL

## 2020-12-09 MED ORDER — DIVALPROEX SODIUM 125 MG PO CSDR
125.0000 mg | DELAYED_RELEASE_CAPSULE | Freq: Three times a day (TID) | ORAL | Status: DC
  Administered 2020-12-10 (×2): 125 mg via ORAL
  Filled 2020-12-09 (×5): qty 1

## 2020-12-09 MED ORDER — CARBIDOPA-LEVODOPA ER 50-200 MG PO TBCR
1.0000 | EXTENDED_RELEASE_TABLET | Freq: Two times a day (BID) | ORAL | Status: DC
  Administered 2020-12-10: 1 via ORAL
  Filled 2020-12-09 (×4): qty 1

## 2020-12-09 SURGICAL SUPPLY — 19 items
DILATOR VESSEL 38 20CM 12FR (INTRODUCER) ×2 IMPLANT
DILATOR VESSEL 38 20CM 14FR (INTRODUCER) ×2 IMPLANT
DILATOR VESSEL 38 20CM 18FR (INTRODUCER) ×2 IMPLANT
DILATOR VESSEL 38 20CM 8FR (INTRODUCER) ×2 IMPLANT
KIT ENCORE 26 ADVANTAGE (KITS) IMPLANT
MICRA AV TRANSCATH PACING SYS (Pacemaker) ×2 IMPLANT
MICRA INTRODUCER SHEATH (SHEATH) ×2
NEEDLE PERC 18GX7CM (NEEDLE) ×2 IMPLANT
PACK CARDIAC CATH (CUSTOM PROCEDURE TRAY) ×2 IMPLANT
PAD ELECT DEFIB RADIOL ZOLL (MISCELLANEOUS) ×4 IMPLANT
PROTECTION STATION PRESSURIZED (MISCELLANEOUS)
SHEATH AVANTI 6FR X 11CM (SHEATH) IMPLANT
SHEATH AVANTI 7FRX11 (SHEATH) ×2 IMPLANT
SHEATH INTRODUCER MICRA (SHEATH) ×1 IMPLANT
STATION PROTECTION PRESSURIZED (MISCELLANEOUS) IMPLANT
SUT SILK 0 FSL (SUTURE) ×2 IMPLANT
SYSTEM PACING TRNSCTH AV MICRA (Pacemaker) ×1 IMPLANT
WIRE AMPLATZ SS-J .035X180CM (WIRE) ×2 IMPLANT
WIRE GUIDERIGHT .035X150 (WIRE) IMPLANT

## 2020-12-09 NOTE — Progress Notes (Signed)
I witnessed conversation Dr. Saralyn Pilar had with patient's wife and daughter regarding the decision to change permanent dual chamber pacemaker insertion to a leadless pacemaker insertion.  This decision was based on patient's history of parkinson's disease and dementia, daughter and wife were concerned about patient's "picking" at the pacemaker site.

## 2020-12-09 NOTE — Discharge Instructions (Signed)
Leadless Pacemaker Implantation  Leadless pacemaker implantation is a procedure to place a small electronic device (leadless cardiac pacemaker) inside your heart to regulate your heartbeat. If your heart rhythm is too slow, this device will send electrical signals to your heart muscle and help your heart beat normally. Unlike a traditional pacemaker, this device does not require electric wires (leads) that run into your heart. The leadless device is much smaller, and has fewer activity restrictions than a traditional pacemaker. In this procedure, the leadless pacemaker is implanted into the right side of your heart. This is done using a long, thin tube (catheter) that is inserted into a large vein in your groin (femoral vein) that leads back to your heart. Tell your health care provider about:  Any allergies you have.  All medicines you are taking, including vitamins, herbs, eye drops, creams, and over-the-counter medicines.  Any problems you or family members have had with anesthetic medicines.  Any blood disorders you have.  Any surgeries you have had.  Any medical conditions you have.  Whether you are pregnant or may be pregnant. What are the risks? Generally, this is a safe procedure. However, problems may occur, including:  Infection.  Bleeding.  Allergic reactions to medicines or dyes.  Damage to nearby structures or organs.  The device coming loose (dislodgement) from inside your heart. What happens before the procedure?  Ask your health care provider about: ? Changing or stopping your regular medicines. This is especially important if you are taking diabetes medicines or blood thinners. ? Taking medicines such as aspirin and ibuprofen. These medicines can thin your blood. Do not take these medicines unless your health care provider tells you to take them. ? Taking over-the-counter medicines, vitamins, herbs, and supplements.  Follow instructions from your health care  provider about eating or drinking restrictions.  Plan to have someone take you home from the hospital.  Ask your health care provider what steps will be taken to help prevent infection. These may include: ? Removing hair at the surgery site. ? Washing skin with a germ-killing soap. What happens during the procedure?  An IV will be inserted into one of your veins.  You will be given: ? A medicine to help you relax (sedative). ? A medicine to numb the area above the femoral vein where the catheter will be inserted (local anesthetic).  A tiny incision will be made in the femoral vein. A catheter with the pacemaker attached to it will be inserted into the vein and advanced toward your heart. Your health care provider will use X-ray imaging to guide the catheter into your heart.  When the catheter is in the right spot, the pacemaker at the end of the catheter will be attached to the inside muscle of your heart.  The pacemaker will be tested to make sure it has attached securely to your heart and is working.  The pacemaker will be released from the catheter, and the catheter will be removed.  After the catheter is removed, pressure will be placed over the groin incision, and a bandage (dressing) will be applied. In some cases, the incision may be closed with tape, glue, or stitches (sutures). The procedure may vary among health care providers and hospitals. What happens after the procedure?  Your blood pressure, heart rate, breathing rate, and blood oxygen level will be monitored until you leave the hospital.  You will need to lie flat in bed for several hours to prevent bleeding from the incision site.  You may spend a night in the hospital to make sure your pacemaker is working well.  Your health care provider will give you instructions for taking care of yourself at home after your procedure.  Do not drive for 24 hours if you were given a sedative during your  procedure. Summary  Leadless pacemaker implantation is a procedure to place a small electronic device (leadless cardiac pacemaker) inside your heart to regulate your heartbeat.  If your heart rhythm becomes too slow, this device will send electrical signals to your heart muscle and help your heart beat normally.  Your health care provider will use a flexible tube (catheter) to insert the pacemaker into a vein in your groin and move it to your heart. Once the pacemaker is attached to your heart, the catheter will be removed.  You may spend a night in the hospital to make sure your pacemaker is working well. This information is not intended to replace advice given to you by your health care provider. Make sure you discuss any questions you have with your health care provider. Document Revised: 06/05/2018 Document Reviewed: 03/09/2018 Elsevier Patient Education  2021 [place] Sinus Syndrome Sick sinus syndrome is a group of conditions that affect heart rhythm and the rate at which the heart beats (heart rate). When you have sick sinus syndrome, your heart rate may be too fast (tachycardia) or too slow (bradycardia), or it may switch between fast and slow. What are the causes? Your heartbeat is controlled by a structure in the heart called the sinoatrial (SA) node. Sick sinus syndrome happens when the SA node does not work properly (SA node dysfunction). Damage to the SA node is the most common cause of sick sinus syndrome.   What increases the risk? You are more likely to develop this condition if you:  Are 85 years of age or older.  Have a family history of sick sinus syndrome.  Have had a heart attack or heart surgery.  Have sleep apnea.  Have coronary artery disease.  Have inflammation of the heart muscle (myocarditis) or the sac that surrounds the heart (pericarditis).  Use medicines such as beta blockers, some calcium channel blockers, or digoxin.  Have an abnormal  level of thyroid hormone or electrolytes.  Have high blood pressure (hypertension).  Have had infections that affect the heart, like rheumatic fever or Lyme's disease.  Were born with heart defects (congenital heart disease). What are the signs or symptoms? Symptoms of this condition include:  Fainting or feeling like you are going to faint.  Chest pain.  Shortness of breath.  Dizziness.  Feeling like your heart skips beats.  Feeling like your heart beats very quickly.  Tiredness.  Fatigue.  Confusion. Some people with this condition do not have any symptoms. Most people have few symptoms or very mild symptoms. How is this diagnosed? This condition may be diagnosed with:  A test that measures electrical activity in the heart (electrocardiogram, orECG).  A test in which you wear a device called a Holter monitor for 1-2 days. The device will record your heart's electrical signals.  A device that records the electrical activity of your heart over a longer period of time (implantable loop recorder).  A test to look at the electrical system of your heart (electrophysiology study).  Blood tests. How is this treated? This condition may be treated by:  Having surgery to put a small, electrical device in your chest to correct your heartbeat (pacemaker).  Taking  medicines to keep your heart from beating too quickly.  Stopping your use of certain medicines as told by your health care provider.  Having treatment for any underlying conditions. If the syndrome is not causing any symptoms, you may not need treatment. Follow these instructions at home: Eating and drinking  Eat a heart-healthy diet that includes fruits, vegetables, whole grains, low-fat dairy products, and lean proteins like poultry and eggs.  Do not drink alcohol if: ? Your health care provider tells you not to drink. ? You are pregnant, may be pregnant, or are planning to become pregnant.  If you drink  alcohol: ? Limit how much you use to:  0-1 drink a day for women.  0-2 drinks a day for men. ? Be aware of how much alcohol is in your drink. In the [place] one drink equals one 12 oz bottle of beer (355 mL), one 5 oz glass of wine (148 mL), or one 1 oz glass of hard liquor (44 mL).   General instructions  Take over-the-counter and prescription medicines only as told by your health care provider.  Stay active and stay at a healthy weight. Ask your health care provider what level of activity is safe for you.  Do not use any products that contain nicotine or tobacco, such as cigarettes, e-cigarettes, and chewing tobacco. If you need help quitting, ask your health care provider.  Keep all follow-up visits as told by your health care provider. This is important. Contact a health care provider if you:  Have a cough that does not go away.  Have swelling in your feet or ankles.  Feel short of breath with activity.  Feel fatigued. Get help right away if you:  Have chest pain or difficulty breathing.  Suddenly have weakness, numbness, or loss of movement on one side of your body.  Suddenly become very confused.  Suddenly lose the ability to speak or understand speech.  Feel like your heart is skipping beats.  Feel like your heart is beating very quickly.  Faint or feel like you are going to faint. These symptoms may represent a serious problem that is an emergency. Do not wait to see if the symptoms will go away. Get medical help right away. Call your local emergency services (911 in the [place] Do not drive yourself to the hospital. Summary  When you have sick sinus syndrome, your heart rate may be too fast (tachycardia) or too slow (bradycardia), or it may switch between fast and slow.  Treatment for this syndrome may include taking medicines and having a pacemaker placed in your chest.  Stay active and stay at a healthy weight. Ask your health care provider what level of  activity is safe for you.  If you have chest pain or difficulty breathing, get help right away. Do not drive yourself to the hospital. This information is not intended to replace advice given to you by your health care provider. Make sure you discuss any questions you have with your health care provider. Document Revised: 10/27/2018 Document Reviewed: 03/30/2018 Elsevier Patient Education  2021 [employer].

## 2020-12-10 DIAGNOSIS — Z01810 Encounter for preprocedural cardiovascular examination: Secondary | ICD-10-CM | POA: Diagnosis not present

## 2020-12-10 DIAGNOSIS — I495 Sick sinus syndrome: Secondary | ICD-10-CM | POA: Diagnosis not present

## 2020-12-10 LAB — BASIC METABOLIC PANEL
Anion gap: 7 (ref 5–15)
BUN: 23 mg/dL (ref 8–23)
CO2: 27 mmol/L (ref 22–32)
Calcium: 9.6 mg/dL (ref 8.9–10.3)
Chloride: 107 mmol/L (ref 98–111)
Creatinine, Ser: 1.05 mg/dL (ref 0.61–1.24)
GFR, Estimated: >60 mL/min (ref >60)
Glucose, Bld: 94 mg/dL (ref 70–99)
Potassium: 3.9 mmol/L (ref 3.5–5.1)
Sodium: 141 mmol/L (ref 135–145)

## 2020-12-10 NOTE — Discharge Summary (Signed)
Physician Discharge Summary      Patient ID: Fred POPOWSKI Sr. MRN: [id number] DOB/AGE: 85-Dec-1932 85 y.o.  Admit date: 12/09/2020 Discharge date: 12/10/2020  Primary Discharge Diagnosis sick sinus syndrome Secondary Discharge Diagnosis same  Significant Diagnostic Studies: none  Consults: None  Hospital Course: 87 year old gentleman with a history of sick sinus syndrome who opted for surgical management, and successfully underwent Micra leadless pacemaker implantation on 12/09/2020 without apparent perioperative complications.    Discharge Exam: Blood pressure (!) 138/120, pulse 62, temperature (!) 97.4 F (36.3 C), resp. rate 16, height 5\' 11"  (1.803 m), weight 69.7 kg, SpO2 (!) 89 %.  General appearance: alert, cooperative, no distress and somnolent Head: Normocephalic, without obvious abnormality, atraumatic Eyes: negative Resp: normal effort of breathing on room air Chest wall: no tenderness Cardio: regular rate and rhythm, S1, S2 normal, no murmur, click, rub or gallop Extremities: extremities normal, atraumatic, no cyanosis or edema Skin: Skin color, texture, turgor normal. No rashes or lesions Neurologic: Mental status: somnolent, easily aroused, slow responses, no slurring or facial droop Incision/Wound:right upper leg/groin, suture removed, gauze and tegaderm applied. No oozing, drainage, surrounding edema or erythema, no signs of hematoma Labs:   Lab Results  Component Value Date   WBC 14.3 (H) 11/18/2020   HGB 14.4 11/18/2020   HCT 43.0 11/18/2020   MCV 95.8 11/18/2020   PLT 256 11/18/2020    Recent Labs  Lab 12/10/20 0337  NA 141  K 3.9  CL 107  CO2 27  BUN 23  CREATININE 1.05  CALCIUM 9.6  GLUCOSE 94     EKG: ventricular paced at a rate of 63 bpm  FOLLOW UP PLANS AND APPOINTMENTS  Allergies as of 12/10/2020   No Known Allergies     Medication List    TAKE these medications   acetaminophen 500 MG tablet Commonly known as:  TYLENOL Take 500 mg by mouth at bedtime.   atorvastatin 10 MG tablet Commonly known as: LIPITOR Take 2 tablets (20 mg total) by mouth daily. This is an increase from 10 mg daily. What changed:   when to take this  additional instructions   carbidopa-levodopa 25-250 MG tablet Commonly known as: SINEMET IR Take 1 tablet by mouth 3 (three) times daily.   carbidopa-levodopa 50-200 MG tablet Commonly known as: SINEMET CR Take 1 tablet by mouth at bedtime.   cholecalciferol 25 MCG (1000 UNIT) tablet Commonly known as: VITAMIN D Take 1,000 Units by mouth daily.   clopidogrel 75 MG tablet Commonly known as: PLAVIX Take 75 mg by mouth in the morning.   divalproex 125 MG DR tablet Commonly known as: DEPAKOTE Take 125 mg by mouth 3 (three) times daily as needed (agitation).   donepezil 10 MG tablet Commonly known as: ARICEPT Take 10 mg by mouth at bedtime.   DULoxetine 20 MG capsule Commonly known as: CYMBALTA Take 20 mg by mouth 2 (two) times daily.   levothyroxine 75 MCG tablet Commonly known as: SYNTHROID Take 75 mcg by mouth daily before breakfast.   Magnesium 500 MG Tabs Take 500 mg by mouth in the morning.   memantine 5 MG tablet Commonly known as: NAMENDA Take 5 mg by mouth 2 (two) times daily.   multivitamin with minerals Tabs tablet Take 1 tablet by mouth in the morning.   omeprazole 40 MG capsule Commonly known as: PRILOSEC Take 40 mg by mouth in the morning.   polyethylene glycol 17 g packet Commonly known as: MIRALAX / GLYCOLAX Take  17 g by mouth daily. Mix one tablespoon with 8oz of your favorite juice or water every day until you are having soft formed stools. Then start taking once daily if you didn't have a stool the day before. What changed: when to take this   vitamin B-12 1000 MCG tablet Commonly known as: CYANOCOBALAMIN Take 1,000 mcg by mouth in the morning.       Follow-up Information    Teodoro Spray, MD. Go on 12/29/2020.    Specialty: Cardiology Why: @ 11:30am Contact information: [place] [address] [place] TO FOLLOW UP APPOINTMENTS  Time spent with patient to include physician time: 30 minutes Signed:  Clabe Seal PA-C 12/10/2020, 9:19 AM

## 2020-12-10 NOTE — Plan of Care (Signed)
  Problem: Clinical Measurements: Goal: Will remain free from infection Outcome: Progressing   Problem: Clinical Measurements: Goal: Ability to maintain clinical measurements within normal limits will improve Outcome: Progressing   Problem: Clinical Measurements: Goal: Diagnostic test results will improve Outcome: Progressing

## 2020-12-17 DIAGNOSIS — G20 Parkinson's disease: Secondary | ICD-10-CM | POA: Diagnosis not present

## 2020-12-17 DIAGNOSIS — R001 Bradycardia, unspecified: Secondary | ICD-10-CM | POA: Diagnosis not present

## 2020-12-29 DIAGNOSIS — G4733 Obstructive sleep apnea (adult) (pediatric): Secondary | ICD-10-CM | POA: Diagnosis not present

## 2020-12-29 DIAGNOSIS — Z9989 Dependence on other enabling machines and devices: Secondary | ICD-10-CM | POA: Diagnosis not present

## 2020-12-29 DIAGNOSIS — E782 Mixed hyperlipidemia: Secondary | ICD-10-CM | POA: Diagnosis not present

## 2020-12-29 DIAGNOSIS — I10 Essential (primary) hypertension: Secondary | ICD-10-CM | POA: Diagnosis not present

## 2021-01-01 ENCOUNTER — Other Ambulatory Visit: Payer: Self-pay

## 2021-01-01 ENCOUNTER — Encounter: Payer: Self-pay | Admitting: Adult Health Nurse Practitioner

## 2021-01-01 ENCOUNTER — Other Ambulatory Visit: Payer: PPO | Admitting: Adult Health Nurse Practitioner

## 2021-01-01 VITALS — BP 116/64 | HR 64

## 2021-01-01 DIAGNOSIS — L89301 Pressure ulcer of unspecified buttock, stage 1: Secondary | ICD-10-CM | POA: Diagnosis not present

## 2021-01-01 DIAGNOSIS — Z515 Encounter for palliative care: Secondary | ICD-10-CM

## 2021-01-01 DIAGNOSIS — I495 Sick sinus syndrome: Secondary | ICD-10-CM

## 2021-01-01 DIAGNOSIS — G20 Parkinson's disease: Secondary | ICD-10-CM | POA: Diagnosis not present

## 2021-01-01 DIAGNOSIS — F0281 Dementia in other diseases classified elsewhere with behavioral disturbance: Secondary | ICD-10-CM | POA: Diagnosis not present

## 2021-01-01 DIAGNOSIS — G309 Alzheimer's disease, unspecified: Secondary | ICD-10-CM | POA: Diagnosis not present

## 2021-01-01 NOTE — Progress Notes (Signed)
[facility] Consult Note Telephone: [phone]  Fax: [phone]    Date of encounter: 01/01/21 PATIENT NAME: Fred Jones [id number]   [phone] (home)  DOB: Sep 14, 1930 MRN: [id number] PRIMARY CARE PROVIDER:    Rusty Aus, MD,  [place] [address] [phone]  REFERRING PROVIDER:   Rusty Aus, MD [facility] [place],  [place] [address] [phone]  RESPONSIBLE PARTY:    Contact Information     Name Relation Home Work Mobile   Fred, Jones [phone]  [phone]   Fred, Jones [phone]   [place] Daughter [phone]  [phone]        I met face to face with patient and family in home. Palliative Care was asked to follow this patient by consultation request of  Fred Aus, MD to address advance care planning and complex medical decision making. This is a follow up visit.  Wife and caregiver, Baxter Flattery, present during visit today                                   ASSESSMENT AND PLAN / RECOMMENDATIONS:   Advance Care Planning/Goals of Care: Goals include to maximize quality of life and symptom management.  CODE STATUS: full code  Symptom Management/Plan:  Parkinson's: This seems stable at this time.  Continue Sinemet as ordered and follow-up and recommendations by neurology.  Alzheimer's dementia: Patient's functional status pretty much unchanged as he is still bed and chair bound and requires total care except feeding.  He is sleeping more.  Appetite decreasing with weight loss as evidenced by his clothing fitting looser.  Sick sinus syndrome: Patient has been having bradycardia and syncopal episodes.  Pacemaker placed on 12/09/2020.  Caregiver states that he is doing much better since pacemaker placement.  Continue follow-up  and recommendations by cardiology  Pressure injury to buttocks: Per caregiver report this is causing patient pain but there is no break in the skin.  Currently caregiver and wife are actively implementing preventative measures such as repositioning and keeping skin clean and dry and applying barrier creams.  Did discuss that could use foam bandages for preventative measures.   Follow up Palliative Care Visit: Palliative care will continue to follow for complex medical decision making, advance care planning, and clarification of goals. Will have office call to set up next follow up visit in 8-10 weeks.  Encouraged to call with any questions or concerns  I spent 60 minutes providing this consultation. More than 50% of the time in this consultation was spent in counseling and care coordination.   PPS: 30%  HOSPICE ELIGIBILITY/DIAGNOSIS: TBD  Chief Complaint: follow up palliative visit  HISTORY OF PRESENT ILLNESS:  Fred FENECH Sr. is a 87 y.o. year old male  with Parkinson's disease, dementia, CKD stage 3, HTN, hypothyroidsim .  Patient had 4 ED visits in the months of April and May of this year for bradycardia and syncope.  On 12/09/2020 pacemaker was placed due to sick sinus syndrome.  Caregiver states that he has been doing better since placement of the pacemaker.  He has not had any dizziness or syncope since pacemaker placement.  Patient is sleeping during visit today but is easily arousable.  Caregiver does state that patient is sleeping more and does sleep approximately  75% of the day.  Patient having increased cognitive decline.  Caregiver and wife do state that at times he does not know where he is and asks to go home even though he is home.  Patient has been switched from Seroquel to Depakote as needed.  Wife does state the hallucinations are not as frequent.  Wife states that they are giving him 2 Tylenol at bedtime and he is sleeping better.  Caregiver and wife concerned about redness and  tenderness to his bottom.  Per caregiver report no break in the skin.  They are repositioning and keeping him clean and dry.  Also applying Endit cream and derma cloud.  They do use a pressure relieving cushion in his chair.  Rest of 10 point ROS asked and negative except what stated in HPI  History obtained from review of EMR and interview with family and Fred Jones.   PHYSICAL EXAM:   General: NAD, frail appearing, thin Eyes: sclera anicteric and noninjected with no discharge noted ENT: moist mucous membranes Cardiovascular: regular rate and rhythm Pulmonary: clear ant fields; normal respiratory effort Abdomen: soft, nontender, + bowel sounds Extremities: no edema Skin: no rashes on exposed skin Neurological: A&O to person and place though sometimes does not remember that he is at home   Thank you for the opportunity to participate in the care of Fred Jones.  The palliative care team will continue to follow. Please call our office at [phone] if we can be of additional assistance.   Damacio Weisgerber Jenetta Downer, NP , DNP  This chart was dictated using voice recognition software.  Despite best efforts to proofread,  errors can occur which can change the documentation meaning.   COVID-19 PATIENT SCREENING TOOL Asked and negative response unless otherwise noted:   Have you had symptoms of covid, tested positive or been in contact with someone with symptoms/positive test in the past 5-10 days?

## 2021-01-05 ENCOUNTER — Ambulatory Visit: Payer: PPO | Admitting: Dermatology

## 2021-01-14 DIAGNOSIS — R441 Visual hallucinations: Secondary | ICD-10-CM | POA: Diagnosis not present

## 2021-01-14 DIAGNOSIS — G20 Parkinson's disease: Secondary | ICD-10-CM | POA: Diagnosis not present

## 2021-01-14 DIAGNOSIS — G4733 Obstructive sleep apnea (adult) (pediatric): Secondary | ICD-10-CM | POA: Diagnosis not present

## 2021-01-14 DIAGNOSIS — R2689 Other abnormalities of gait and mobility: Secondary | ICD-10-CM | POA: Diagnosis not present

## 2021-01-14 DIAGNOSIS — Z8673 Personal history of transient ischemic attack (TIA), and cerebral infarction without residual deficits: Secondary | ICD-10-CM | POA: Diagnosis not present

## 2021-01-14 DIAGNOSIS — Z9989 Dependence on other enabling machines and devices: Secondary | ICD-10-CM | POA: Diagnosis not present

## 2021-01-29 DIAGNOSIS — F0280 Dementia in other diseases classified elsewhere without behavioral disturbance: Secondary | ICD-10-CM | POA: Diagnosis not present

## 2021-01-29 DIAGNOSIS — Z0000 Encounter for general adult medical examination without abnormal findings: Secondary | ICD-10-CM | POA: Diagnosis not present

## 2021-01-29 DIAGNOSIS — F0150 Vascular dementia without behavioral disturbance: Secondary | ICD-10-CM | POA: Diagnosis not present

## 2021-01-29 DIAGNOSIS — R7309 Other abnormal glucose: Secondary | ICD-10-CM | POA: Diagnosis not present

## 2021-01-29 DIAGNOSIS — I63422 Cerebral infarction due to embolism of left anterior cerebral artery: Secondary | ICD-10-CM | POA: Diagnosis not present

## 2021-01-29 DIAGNOSIS — E538 Deficiency of other specified B group vitamins: Secondary | ICD-10-CM | POA: Diagnosis not present

## 2021-01-29 DIAGNOSIS — G309 Alzheimer's disease, unspecified: Secondary | ICD-10-CM | POA: Diagnosis not present

## 2021-01-29 DIAGNOSIS — F330 Major depressive disorder, recurrent, mild: Secondary | ICD-10-CM | POA: Diagnosis not present

## 2021-01-29 DIAGNOSIS — E782 Mixed hyperlipidemia: Secondary | ICD-10-CM | POA: Diagnosis not present

## 2021-02-02 ENCOUNTER — Telehealth: Payer: Self-pay

## 2021-02-02 NOTE — Telephone Encounter (Signed)
RN call to schedule follow up appt for palliative care. Wife reports patient is not sleeping well and confused at times, requests visit.  Scheduled follow up for NP on 7/21 at 3:20 pm

## 2021-02-05 ENCOUNTER — Other Ambulatory Visit: Payer: Self-pay

## 2021-02-05 ENCOUNTER — Other Ambulatory Visit: Payer: PPO | Admitting: Student

## 2021-02-05 DIAGNOSIS — Z515 Encounter for palliative care: Secondary | ICD-10-CM | POA: Diagnosis not present

## 2021-02-05 DIAGNOSIS — G20 Parkinson's disease: Secondary | ICD-10-CM | POA: Diagnosis not present

## 2021-02-05 DIAGNOSIS — R630 Anorexia: Secondary | ICD-10-CM | POA: Diagnosis not present

## 2021-02-05 DIAGNOSIS — F0391 Unspecified dementia with behavioral disturbance: Secondary | ICD-10-CM

## 2021-02-05 NOTE — Progress Notes (Signed)
[facility] Consult Note Telephone: [phone]  Fax: [phone]    Date of encounter: 02/05/21 PATIENT NAME: Fred Jones [id number]   [phone] (home)  DOB: 1930-11-21 MRN: [id number] PRIMARY CARE PROVIDER:    Rusty Aus, MD,  [place] [address] [phone]  REFERRING PROVIDER:   Rusty Aus, MD [facility] [place],  [place] [address] [phone]  RESPONSIBLE PARTY:    Contact Information     Name Relation Home Work Mobile   Fred Jones [phone]  [phone]   Fred Jones [phone]   [name] [phone]  [phone]        I met face to face with patient and family and caregiver in the home. Palliative Care was asked to follow this patient by consultation request of  Fred Aus, MD to address advance care planning and complex medical decision making. This is a follow up visit.                                   ASSESSMENT AND PLAN / RECOMMENDATIONS:   Advance Care Planning/Goals of Care: Goals include to maximize quality of life and symptom management. Our advance care planning conversation included a discussion about:    The value and importance of advance care planning  Experiences with loved ones who have been seriously ill or have died  Exploration of personal, cultural or spiritual beliefs that might influence medical decisions  Exploration of goals of care in the event of a sudden injury or illness  CODE STATUS: Full Code  Symptom Management/Plan:  Parkinson's disease-wife reports no further changes or declines. Continue Sinemet as directed. Caregivers and wife to continue assisting with all adl/care needs. Follow up with neurology as scheduled.   Dementia-patient is dependent for all adl's. Patient  is back to baseline. He had increased confusion, sleep difficulty x 3 days. Patient's medication adjusted; Tylenol PM stopped, Depakote tapered and he is back to baseline. He continues to nap throughout day. Continue assist with adl's.   Appetite-fair appetite endorsed. Weight loss noted due to clothes fitting looser. Continue to offer foods patient enjoys, encourage nutritional supplements. Assist with feeding as needed.    Follow up Palliative Care Visit: Palliative care will continue to follow for complex medical decision making, advance care planning, and clarification of goals. Return in 8 weeks or prn.  I spent 40 minutes providing this consultation. More than 50% of the time in this consultation was spent in counseling and care coordination.    PPS: 30%  HOSPICE ELIGIBILITY/DIAGNOSIS: TBD  Chief Complaint: Palliative Medicine follow up visit.  HISTORY OF PRESENT ILLNESS:  Fred TINGLER Sr. is a 87 y.o. year old male  with Parkinson's disease, dementia, depression, CVA, hyperlipidemia.  Patient resides at home with wife; has caregivers daily. Patient had three days with increased confusion, increased sleep difficulty. He had been started on prophylactic antibiotics. Antibiotic stopped yesterday as he does not have a UTI. Patient's medications adjusted; Tylenol PM was stopped and Depakote tapered. Wife and caregiver report patient now being back to baseline. He is dependent for most adl's. Caregivers use lift or will stand and pivot to transfer. Fair appetite reported; he is eating two meals a day usually due to sleeping during the day. He can  feed him self, although caregivers will sometimes assist with feeding. No swallowing difficulty reported. Wife does endorse some weight loss as evidenced by clothes fitting looser. He wears pull ups during the day, diapers at night. No skin breakdown reported. No recent falls or injury. HPI/ROS obtained primarily from wife and caregiver. A 10 Point ROS  is negative except for pertinent positives and negatives detailed per HPI.  Patient received resting in recliner. He does open his eyes to questions, but keeps his eyes closed most of visit. He denies any pain or discomfort.   History obtained from review of EMR, discussion with primary team, and interview with family, facility staff/caregiver and/or Fred Jones.  I reviewed available labs, medications, imaging, studies and related documents from the EMR.  Records reviewed and summarized above.    Physical Exam: Pulse 68, resp 16, b/p 118/78, Constitutional: NAD General: frail appearing EYES: anicteric sclera, lids intact, no discharge  ENMT: intact hearing, oral mucous membranes moist CV: S1S2, RRR, no LE edema Pulmonary: LCTA, no increased work of breathing, no cough, room air Abdomen: normo-active BS + 4 quadrants, soft and non tender GU: deferred MSK: non-ambulatory Skin: warm and dry, no rashes or wounds on visible skin Neuro: generalized weakness, A & O to person, familiars Psych: non-anxious affect Hem/lymph/immuno: no widespread bruising   Thank you for the opportunity to participate in the care of Fred Jones.  The palliative care team will continue to follow. Please call our office at [phone] if we can be of additional assistance.   Fred Slocumb, NP   COVID-19 PATIENT SCREENING TOOL Asked and negative response unless otherwise noted:   Have you had symptoms of covid, tested positive or been in contact with someone with symptoms/positive test in the past 5-10 days? No

## 2021-04-21 DIAGNOSIS — L89153 Pressure ulcer of sacral region, stage 3: Secondary | ICD-10-CM | POA: Diagnosis not present

## 2021-04-21 DIAGNOSIS — Z23 Encounter for immunization: Secondary | ICD-10-CM | POA: Diagnosis not present

## 2021-04-21 DIAGNOSIS — C44229 Squamous cell carcinoma of skin of left ear and external auricular canal: Secondary | ICD-10-CM | POA: Diagnosis not present

## 2021-04-23 ENCOUNTER — Other Ambulatory Visit: Payer: PPO | Admitting: Student

## 2021-04-23 ENCOUNTER — Other Ambulatory Visit: Payer: Self-pay

## 2021-04-23 DIAGNOSIS — L89302 Pressure ulcer of unspecified buttock, stage 2: Secondary | ICD-10-CM | POA: Diagnosis not present

## 2021-04-23 DIAGNOSIS — G20 Parkinson's disease: Secondary | ICD-10-CM | POA: Diagnosis not present

## 2021-04-23 DIAGNOSIS — F02811 Dementia in other diseases classified elsewhere, unspecified severity, with agitation: Secondary | ICD-10-CM | POA: Diagnosis not present

## 2021-04-23 DIAGNOSIS — Z515 Encounter for palliative care: Secondary | ICD-10-CM

## 2021-04-23 NOTE — Progress Notes (Signed)
[facility] Consult Note Telephone: [phone]  Fax: [phone]    Date of encounter: 04/23/21 10:10 PM PATIENT NAME: [name] [id number]   [phone] (home)  DOB: 14-Oct-1930 MRN: [id number] PRIMARY CARE PROVIDER:    Rusty Aus, MD,  [place] [address] [phone]  REFERRING PROVIDER:   Rusty Aus, MD [facility] [place],  [place] [address] [phone]  RESPONSIBLE PARTY:    Contact Information     Name Relation Home Work Mobile   Fred, Jones [phone]  [phone]   Fred, Jones [phone]   [place] Daughter [phone]  [phone]        I met face to face with patient and family in the home. Palliative Care was asked to follow this patient by consultation request of  Rusty Aus, MD to address advance care planning and complex medical decision making. This is a follow up visit.                                   ASSESSMENT AND PLAN / RECOMMENDATIONS:   Advance Care Planning/Goals of Care: Goals include to maximize quality of life and symptom management.  CODE STATUS: Full Code  Symptom Management/Plan:   Parkinson's disease-Continue Sinemet as directed. Caregivers and wife to continue assisting with all adl/care needs. Follow up with neurology as scheduled.    Dementia-patient is dependent for all adl's. Caregivers to provide supportive care. Reorient/redirect as needed. Assist with feeding. Encourage foods patient enjoys, continue nutritional supplements daily. Continue trazodone QHS for agitation/sleep.   Stage 2 wound/fissure-continue silvadene cream 1% apply BID. Offload, turn and reposition every two hours. Continue pressure relieving cushion in w/c and recliner. Education on nutrition aiding in wound  healing as well as providing good pressure relief.   Follow up Palliative Care Visit: Palliative care will continue to follow for complex medical decision making, advance care planning, and clarification of goals. Return in 8 weeks or prn.  I spent 40 minutes providing this consultation. More than 50% of the time in this consultation was spent in counseling and care coordination.   PPS: 30%  HOSPICE ELIGIBILITY/DIAGNOSIS: TBD  Chief Complaint: Palliative Medicine follow up visit.   HISTORY OF PRESENT ILLNESS:  Fred GEISTER Sr. is a 87 y.o. year old male  with  Parkinson's disease, dementia, depression, CVA, hyperlipidemia.   Patient resides at home, with wife.  Patient has daily caregivers in the home.  Patient recently seen by PCP due to lesion on ear, and stage II to buttocks.  Patient received resting in bed today. He arouses to verbal and tactile stimulation. Patient denies having any pain, shortness of breath, nausea. Occasional constipation reported per wife; MiraLAX given with effectiveness.  Fair appetite reported. Caregiver Fred Jones states patient is now eating 2 meals a day, drinking 1 nutritional supplement daily. Wife and caregiver states agitation has improved since starting on trazodone. Patient is sleeping more throughout the day. No recent falls or injury reported. No recent ED visits or hospitalizations. HPI and ROS primarily obtained from wife and caregiver due to patient's dementia. A 10-point review of systems is negative, except for the pertinent positives and negatives detailed in the HPI.    History obtained from review of EMR, discussion with primary team, and interview  with family, facility staff/caregiver and/or Fred Jones.  I reviewed available labs, medications, imaging, studies and related documents from the EMR.  Records reviewed and summarized above.     Physical Exam:  Constitutional: NAD General: frail appearing EYES: anicteric sclera, lids intact, no  discharge  ENMT: intact hearing, oral mucous membranes moist, dentition intact CV: S1S2, RRR, no LE edema Pulmonary: LCTA, no increased work of breathing, no cough, room air Abdomen:normo-active BS + 4 quadrants, soft and non tender GU: deferred MSK: sarcopenia, non-ambulatory Skin: warm and dry, sacral fissure present; no bleeding. Dressing to left ear CDI Neuro: generalized weakness, A & O to person, familiars Psych: non-anxious affect,  Hem/lymph/immuno: no widespread bruising   Thank you for the opportunity to participate in the care of Fred Jones.  The palliative care team will continue to follow. Please call our office at [phone] if we can be of additional assistance.   Ezekiel Slocumb, NP   COVID-19 PATIENT SCREENING TOOL Asked and negative response unless otherwise noted:   Have you had symptoms of covid, tested positive or been in contact with someone with symptoms/positive test in the past 5-10 days? No

## 2021-05-11 ENCOUNTER — Emergency Department: Payer: PPO

## 2021-05-11 ENCOUNTER — Other Ambulatory Visit: Payer: Self-pay

## 2021-05-11 ENCOUNTER — Emergency Department
Admission: EM | Admit: 2021-05-11 | Discharge: 2021-05-11 | Disposition: A | Discharge status: Home / Self Care | Payer: PPO | Attending: Student in an Organized Health Care Education/Training Program | Admitting: Student in an Organized Health Care Education/Training Program

## 2021-05-11 DIAGNOSIS — E039 Hypothyroidism, unspecified: Secondary | ICD-10-CM | POA: Diagnosis not present

## 2021-05-11 DIAGNOSIS — Z79899 Other long term (current) drug therapy: Secondary | ICD-10-CM | POA: Insufficient documentation

## 2021-05-11 DIAGNOSIS — Z8546 Personal history of malignant neoplasm of prostate: Secondary | ICD-10-CM | POA: Diagnosis not present

## 2021-05-11 DIAGNOSIS — Z950 Presence of cardiac pacemaker: Secondary | ICD-10-CM | POA: Diagnosis not present

## 2021-05-11 DIAGNOSIS — K625 Hemorrhage of anus and rectum: Secondary | ICD-10-CM | POA: Insufficient documentation

## 2021-05-11 DIAGNOSIS — K5900 Constipation, unspecified: Secondary | ICD-10-CM | POA: Insufficient documentation

## 2021-05-11 DIAGNOSIS — G20 Parkinson's disease: Secondary | ICD-10-CM | POA: Insufficient documentation

## 2021-05-11 DIAGNOSIS — F0280 Dementia in other diseases classified elsewhere without behavioral disturbance: Secondary | ICD-10-CM | POA: Insufficient documentation

## 2021-05-11 DIAGNOSIS — I129 Hypertensive chronic kidney disease with stage 1 through stage 4 chronic kidney disease, or unspecified chronic kidney disease: Secondary | ICD-10-CM | POA: Insufficient documentation

## 2021-05-11 DIAGNOSIS — R109 Unspecified abdominal pain: Secondary | ICD-10-CM | POA: Diagnosis not present

## 2021-05-11 DIAGNOSIS — N183 Chronic kidney disease, stage 3 unspecified: Secondary | ICD-10-CM | POA: Insufficient documentation

## 2021-05-11 DIAGNOSIS — Z7902 Long term (current) use of antithrombotics/antiplatelets: Secondary | ICD-10-CM | POA: Diagnosis not present

## 2021-05-11 DIAGNOSIS — K5641 Fecal impaction: Secondary | ICD-10-CM | POA: Diagnosis not present

## 2021-05-11 DIAGNOSIS — R52 Pain, unspecified: Secondary | ICD-10-CM | POA: Diagnosis not present

## 2021-05-11 DIAGNOSIS — R0902 Hypoxemia: Secondary | ICD-10-CM | POA: Diagnosis not present

## 2021-05-11 DIAGNOSIS — K219 Gastro-esophageal reflux disease without esophagitis: Secondary | ICD-10-CM | POA: Diagnosis not present

## 2021-05-11 LAB — CBC
HCT: 43 % (ref 39.0–52.0)
Hemoglobin: 15 g/dL (ref 13.0–17.0)
MCH: 34.3 pg — ABNORMAL HIGH (ref 26.0–34.0)
MCHC: 34.9 g/dL (ref 30.0–36.0)
MCV: 98.4 fL (ref 80.0–100.0)
Platelets: 223 10*3/uL (ref 150–400)
RBC: 4.37 MIL/uL (ref 4.22–5.81)
RDW: 13.2 % (ref 11.5–15.5)
WBC: 14.9 10*3/uL — ABNORMAL HIGH (ref 4.0–10.5)
nRBC: 0 % (ref 0.0–0.2)

## 2021-05-11 LAB — COMPREHENSIVE METABOLIC PANEL
ALT: 6 U/L (ref 0–44)
AST: 24 U/L (ref 15–41)
Albumin: 3.6 g/dL (ref 3.5–5.0)
Alkaline Phosphatase: 67 U/L (ref 38–126)
Anion gap: 6 (ref 5–15)
BUN: 19 mg/dL (ref 8–23)
CO2: 28 mmol/L (ref 22–32)
Calcium: 9.8 mg/dL (ref 8.9–10.3)
Chloride: 103 mmol/L (ref 98–111)
Creatinine, Ser: 1.19 mg/dL (ref 0.61–1.24)
GFR, Estimated: 59 mL/min — ABNORMAL LOW (ref >60)
Glucose, Bld: 103 mg/dL — ABNORMAL HIGH (ref 70–99)
Potassium: 4.2 mmol/L (ref 3.5–5.1)
Sodium: 137 mmol/L (ref 135–145)
Total Bilirubin: 1.2 mg/dL (ref 0.3–1.2)
Total Protein: 6.6 g/dL (ref 6.5–8.1)

## 2021-05-11 LAB — LIPASE, BLOOD: Lipase: 51 U/L (ref 11–51)

## 2021-05-11 NOTE — ED Provider Notes (Signed)
[facility] Emergency Department Provider Note    Event Date/Time   First MD Initiated Contact with Patient 05/11/21 1224     (approximate)  I have reviewed the triage vital signs and the nursing notes.   HISTORY  Chief Complaint Abdominal Pain and Constipation    HPI ELYAN VANWIEREN Sr. is a 87 y.o. male below listed past medical history presents to the ER for evaluation of constipation and rectal pain.  No fevers.  No nausea or vomiting.  Symptoms have been ongoing since yesterday.  Nurses aide tried disimpaction yesterday but was unsuccessful and was concerned that he is impacted.   Past Medical History:  Diagnosis Date   Arthritis    fingers, neck   Benign neoplasm of colon, unspecified    Calculus of kidney    Cancer of prostate ([place]) 12/21/2013   3/07 resection   Cancer of skin    Cerebrovascular accident (CVA) due to embolism of left anterior cerebral artery ([place]) 12/25/2015   Carotid negative, echo negative, right hand weakness, 6/17   Chronic kidney disease, stage III (moderate) (HCC)    Dementia (HCC)    Depression    unspecified   Ganglion of tendon sheath    GERD (gastroesophageal reflux disease)    rare   History of urethral stricture    postoperative urethral stricture   Hyperlipidemia, unspecified 12/21/2013   Hypertension 12/31/2013   Echo s/p fall 11/15   Kidney stone 12/21/2013   Lumbar disc disease 12/21/2013   Malignant neoplasm of prostate ([place])    08/2005 Gleason's  7 (3 4) left base, left mid medial, and left mid lateral. PSA 4.6 with a percent free PSA   Melanoma ([place]) 12/18/2007   RUQ abdomen costal margin.  MM, superfiical spreading. Clark's level II, Breslow's 0.57mm.    Melanoma in situ ([place]) 09/02/2008   Right lat. chin inf. to oral commisure. MMIS.   Microscopic hematuria    Nonspecific finding on examination of urine    Other hammer toe(s) (acquired), unspecified foot    Squamous cell carcinoma of skin  04/04/2014   Left ear antihelix. SCCis, hypertrophic.   Squamous cell carcinoma of skin 05/30/2019   Left lat. elbow. WD SCC.    Stress incontinence, male    Stroke ([place])    Unilateral inguinal hernia, without obstruction or gangrene, not specified as recurrent    unilateral or unspecified   Vascular disorder of penis    Family History  Problem Relation Age of Onset   Cancer Other    Stroke Other    Heart attack Other    Stroke Mother    Cancer Mother    Heart attack Mother    Stroke Father    Cancer Father    Cancer Sister    Prostate cancer Brother    GU problems Neg Hx    Kidney disease Neg Hx    Past Surgical History:  Procedure Laterality Date   CATARACT EXTRACTION W/PHACO Right 11/27/2014   Procedure: CATARACT EXTRACTION PHACO AND INTRAOCULAR LENS PLACEMENT ([place]);  Surgeon: Leandrew Koyanagi, MD;  Location: [facility];  Service: Ophthalmology;  Laterality: Right;   COLONOSCOPY     with removal of colonic polyps   EXTRACORPOREAL SHOCK WAVE LITHOTRIPSY Left 05/2012   EYE SURGERY Left 09/25/14   cataract @[facility]    HERNIA REPAIR  1999   HERNIA REPAIR Bilateral 05/2008   Bilateral inguinal hernia repair --- Dr. Tamala Julian   KIDNEY STONE SURGERY  MELANOMA EXCISION  12/2007   melanoma resection   PACEMAKER LEADLESS INSERTION N/A 12/09/2020   Procedure: PACEMAKER LEADLESS INSERTION;  Surgeon: Isaias Cowman, MD;  Location: [facility];  Service: Cardiovascular;  Laterality: N/A;   RETROPUBIC PROSTATECTOMY  09/2005   Radical prostatectomy perineal - Dr. Jacqlyn Larsen   WRIST GANGLION EXCISION Bilateral    Patient Active Problem List   Diagnosis Date Noted   Sick sinus syndrome ([place]) 12/09/2020   Other nonthrombocytopenic purpura ([place]) 10/23/2020   Stroke ([place]) 08/11/2019   Acute CVA (cerebrovascular accident) ([place]) 08/10/2019   Acquired hypothyroidism 08/10/2019   Parkinson's disease ([place]) 08/10/2019   History of CVA (cerebrovascular accident) 08/10/2019    CAP (community acquired pneumonia) 07/23/2017   Weakness 07/07/2016   Stroke (cerebrum) ([place]) 12/20/2015   HTN (hypertension) 12/20/2015   GERD (gastroesophageal reflux disease) 12/20/2015   Depression 12/20/2015      Prior to Admission medications   Medication Sig Start Date End Date Taking? Authorizing Provider  acetaminophen (TYLENOL) 500 MG tablet Take 500 mg by mouth at bedtime.    [provider]  atorvastatin (LIPITOR) 10 MG tablet Take 2 tablets (20 mg total) by mouth daily. This is an increase from 10 mg daily. Patient taking differently: Take 20 mg by mouth at bedtime. 08/13/19 09/12/19  Enzo Bi, MD  carbidopa-levodopa (SINEMET CR) 50-200 MG tablet Take 1 tablet by mouth at bedtime. 07/12/19   [provider]  carbidopa-levodopa (SINEMET IR) 25-250 MG tablet Take 1 tablet by mouth 3 (three) times daily.  06/14/16   [provider]  cholecalciferol (VITAMIN D) 25 MCG (1000 UNIT) tablet Take 1,000 Units by mouth daily.    [provider]  clopidogrel (PLAVIX) 75 MG tablet Take 75 mg by mouth in the morning.    [provider]  divalproex (DEPAKOTE) 125 MG DR tablet Take 125 mg by mouth 3 (three) times daily as needed (agitation). 09/30/20   [provider]  donepezil (ARICEPT) 10 MG tablet Take 10 mg by mouth at bedtime. 08/12/20   [provider]  DULoxetine (CYMBALTA) 20 MG capsule Take 20 mg by mouth 2 (two) times daily. 10/02/20   [provider]  levothyroxine (SYNTHROID, LEVOTHROID) 75 MCG tablet Take 75 mcg by mouth daily before breakfast. 03/02/17   [provider]  Magnesium 500 MG TABS Take 500 mg by mouth in the morning.    [provider]  memantine (NAMENDA) 5 MG tablet Take 5 mg by mouth 2 (two) times daily. 10/16/20   [provider]  Multiple Vitamin (MULTIVITAMIN WITH MINERALS) TABS tablet Take 1 tablet by mouth in the morning.    [provider]  omeprazole  (PRILOSEC) 40 MG capsule Take 40 mg by mouth in the morning. 06/11/19   [provider]  polyethylene glycol (MIRALAX / GLYCOLAX) 17 g packet Take 17 g by mouth daily. Mix one tablespoon with 8oz of your favorite juice or water every day until you are having soft formed stools. Then start taking once daily if you didn't have a stool the day before. Patient taking differently: Take 17 g by mouth in the morning. Mix one tablespoon with 8oz of your favorite juice or water every day until you are having soft formed stools. Then start taking once daily if you didn't have a stool the day before. 11/18/20   Merlyn Lot, MD  vitamin B-12 (CYANOCOBALAMIN) 1000 MCG tablet Take 1,000 mcg by mouth in the morning.    [provider]    Allergies Patient has no known allergies.    Social History Social History   Tobacco Use   Smoking status: Never   Smokeless tobacco: Never  Vaping Use   Vaping Use: Never used  Substance Use Topics   Alcohol use: Yes    Alcohol/week: 4.0 standard drinks    Types: 4 Glasses of wine per week    Comment: occasionally   Drug use: No    Review of Systems Patient denies headaches, rhinorrhea, blurry vision, numbness, shortness of breath, chest pain, edema, cough, abdominal pain, nausea, vomiting, diarrhea, dysuria, fevers, rashes or hallucinations unless otherwise stated above in HPI. ____________________________________________   PHYSICAL EXAM:  VITAL SIGNS: Vitals:   05/11/21 1048  BP: 136/89  Pulse: 71  Resp: 16  Temp: 98 F (36.7 C)  SpO2: 99%    Constitutional: Alert and oriented.  Eyes: Conjunctivae are normal.  Head: Atraumatic. Nose: No congestion/rhinnorhea. Mouth/Throat: Mucous membranes are moist.   Neck: No stridor. Painless ROM.  Cardiovascular: Normal rate, regular rhythm. Grossly normal heart sounds.  Good peripheral circulation. Respiratory: Normal respiratory effort.  No retractions. Lungs  CTAB. Gastrointestinal: Soft and nontender. No distention. No abdominal bruits. No CVA tenderness. Genitourinary: Very large amount of rectal stool consistent with fecal impaction.  No surround erythema, fluctuance or mass Musculoskeletal: No lower extremity tenderness nor edema.  No joint effusions. Neurologic:  Normal speech and language. No gross focal neurologic deficits are appreciated. No facial droop Skin:  Skin is warm, dry and intact. No rash noted. Psychiatric: Mood and affect are normal. Speech and behavior are normal.  ____________________________________________   LABS (all labs ordered are listed, but only abnormal results are displayed)  Results for orders placed or performed during the hospital encounter of 05/11/21 (from the past 24 hour(s))  Lipase, blood     Status: None   Collection Time: 05/11/21 10:56 AM  Result Value Ref Range   Lipase 51 11 - 51 U/L  Comprehensive metabolic panel     Status: Abnormal   Collection Time: 05/11/21 10:56 AM  Result Value Ref Range   Sodium 137 135 - 145 mmol/L   Potassium 4.2 3.5 - 5.1 mmol/L   Chloride 103 98 - 111 mmol/L   CO2 28 22 - 32 mmol/L   Glucose, Bld 103 (H) 70 - 99 mg/dL   BUN 19 8 - 23 mg/dL   Creatinine, Ser 1.19 0.61 - 1.24 mg/dL   Calcium 9.8 8.9 - 10.3 mg/dL   Total Protein 6.6 6.5 - 8.1 g/dL   Albumin 3.6 3.5 - 5.0 g/dL   AST 24 15 - 41 U/L   ALT 6 0 - 44 U/L   Alkaline Phosphatase 67 38 - 126 U/L   Total Bilirubin 1.2 0.3 - 1.2 mg/dL   GFR, Estimated 59 (L) >60 mL/min   Anion gap 6 5 - 15  CBC     Status: Abnormal   Collection Time: 05/11/21 10:56 AM  Result Value Ref Range   WBC 14.9 (H) 4.0 - 10.5 K/uL   RBC 4.37 4.22 - 5.81 MIL/uL   Hemoglobin 15.0 13.0 - 17.0 g/dL   HCT 43.0 39.0 - 52.0 %   MCV 98.4 80.0 - 100.0 fL   MCH 34.3 (H) 26.0 - 34.0 pg   MCHC 34.9 30.0 - 36.0 g/dL   RDW 13.2 11.5 - 15.5 %   Platelets 223 150 - 400 K/uL   nRBC 0.0 0.0 - 0.2 %  ____________________________________________ ________________________  [id number]  I personally reviewed all radiographic images ordered to evaluate for the above acute complaints and reviewed radiology reports and findings.  These findings were personally discussed with the patient.  Please see medical record for radiology report.  ____________________________________________   PROCEDURES  Procedure(s) performed:  Procedures ------------------------------------------------------------------------------------------------------------------- Fecal Disimpaction Procedure Note:  Performed by me:  Patient placed in the lateral recumbent position with knees drawn towards chest. Nurse present for patient support. Large amount of hard brown stool removed. No complications during procedure.   ------------------------------------------------------------------------------------------------------------------    Critical Care performed: no ____________________________________________   INITIAL IMPRESSION / ASSESSMENT AND PLAN / ED COURSE  Pertinent labs & imaging results that were available during my care of the patient were reviewed by me and considered in my medical decision making (see chart for details).   DDX: Impaction, obstruction, constipation, mass, abscess  KHYLON DAVIES Sr. is a 87 y.o. who presents to the ED with presentation as described above.  Patient's presentation consistent with fecal impaction.  Disimpaction performed with significant improvement in the patient's pain and discomfort.   Blood work otherwise reassuring.   XR with no obstructive bowel gas pattern.  Patient feels significantly improved. Does appear stable and appropriate for additional management as an outpatient.     The patient was evaluated in Emergency Department today for the symptoms described in the history of present illness. He/she was evaluated in the context of the global COVID-19 pandemic,  which necessitated consideration that the patient might be at risk for infection with the SARS-CoV-2 virus that causes COVID-19. Institutional protocols and algorithms that pertain to the evaluation of patients at risk for COVID-19 are in a state of rapid change based on information released by regulatory bodies including the CDC and federal and state organizations. These policies and algorithms were followed during the patient's care in the ED.  As part of my medical decision making, I reviewed the following data within the [facility] notes reviewed and incorporated, Labs reviewed, notes from prior ED visits and [place] Controlled Substance Database   ____________________________________________   FINAL CLINICAL IMPRESSION(S) / ED DIAGNOSES  Final diagnoses:  Constipation, unspecified constipation type  Fecal impaction in rectum ([place])      NEW MEDICATIONS STARTED DURING THIS VISIT:  New Prescriptions   No medications on file     Note:  This document was prepared using Dragon voice recognition software and may include unintentional dictation errors.    Merlyn Lot, MD 05/11/21 1352

## 2021-05-11 NOTE — Discharge Instructions (Signed)
I recommend you try drinking 8oz or prune juice daily to help with constipation.  If still having trouble with constipation after one or two days, you can mix 1 tablespoon of mineral oil in with prune juice to help.    You should be using miralax one to two times daily with 8 oz of liquid.  Please follow up with PCP.  Return to the ER for any additional questions or concerns.

## 2021-05-11 NOTE — ED Triage Notes (Signed)
Pt arrives via ems from home, pt is bed bound according to ems. Pt's family called due to pt being constipated and c/o abd pain, pt denies any pain at this time and is not tender to touch at this time

## 2021-05-26 DIAGNOSIS — R001 Bradycardia, unspecified: Secondary | ICD-10-CM | POA: Diagnosis not present

## 2021-06-08 DIAGNOSIS — H6192 Disorder of left external ear, unspecified: Secondary | ICD-10-CM | POA: Diagnosis not present

## 2021-07-01 DIAGNOSIS — H6192 Disorder of left external ear, unspecified: Secondary | ICD-10-CM | POA: Diagnosis not present

## 2021-07-25 DIAGNOSIS — F0284 Dementia in other diseases classified elsewhere, unspecified severity, with anxiety: Secondary | ICD-10-CM | POA: Diagnosis not present

## 2021-07-25 DIAGNOSIS — F0282 Dementia in other diseases classified elsewhere, unspecified severity, with psychotic disturbance: Secondary | ICD-10-CM | POA: Diagnosis not present

## 2021-07-25 DIAGNOSIS — G20 Parkinson's disease: Secondary | ICD-10-CM | POA: Diagnosis not present

## 2021-07-25 DIAGNOSIS — M489 Spondylopathy, unspecified: Secondary | ICD-10-CM | POA: Diagnosis not present

## 2021-07-25 DIAGNOSIS — R464 Slowness and poor responsiveness: Secondary | ICD-10-CM | POA: Diagnosis not present

## 2021-07-25 DIAGNOSIS — N390 Urinary tract infection, site not specified: Secondary | ICD-10-CM | POA: Diagnosis not present

## 2021-07-29 DIAGNOSIS — N390 Urinary tract infection, site not specified: Secondary | ICD-10-CM | POA: Diagnosis not present

## 2021-07-29 DIAGNOSIS — Z87898 Personal history of other specified conditions: Secondary | ICD-10-CM | POA: Diagnosis not present

## 2021-07-30 ENCOUNTER — Other Ambulatory Visit: Payer: PPO | Admitting: Student

## 2021-07-30 ENCOUNTER — Other Ambulatory Visit: Payer: Self-pay

## 2021-07-30 DIAGNOSIS — N390 Urinary tract infection, site not specified: Secondary | ICD-10-CM

## 2021-07-30 DIAGNOSIS — F02811 Dementia in other diseases classified elsewhere, unspecified severity, with agitation: Secondary | ICD-10-CM

## 2021-07-30 DIAGNOSIS — Z515 Encounter for palliative care: Secondary | ICD-10-CM | POA: Diagnosis not present

## 2021-07-30 DIAGNOSIS — G20 Parkinson's disease: Secondary | ICD-10-CM

## 2021-07-31 ENCOUNTER — Telehealth: Payer: Self-pay | Admitting: Student

## 2021-07-31 DIAGNOSIS — R443 Hallucinations, unspecified: Secondary | ICD-10-CM | POA: Diagnosis not present

## 2021-07-31 DIAGNOSIS — F0392 Unspecified dementia, unspecified severity, with psychotic disturbance: Secondary | ICD-10-CM | POA: Diagnosis not present

## 2021-07-31 DIAGNOSIS — Z87440 Personal history of urinary (tract) infections: Secondary | ICD-10-CM | POA: Diagnosis not present

## 2021-07-31 DIAGNOSIS — F03911 Unspecified dementia, unspecified severity, with agitation: Secondary | ICD-10-CM | POA: Diagnosis not present

## 2021-07-31 NOTE — Progress Notes (Signed)
[facility] Palliative Care Consult Note Telephone: [phone]  Fax: [phone]    Date of encounter: 07/30/2021  PATIENT NAME: Fred Jones [id number]   [phone] (home)  DOB: 1931-06-12 MRN: [id number] PRIMARY CARE PROVIDER:    Rusty Aus, MD,  [place] [address] [phone]  REFERRING PROVIDER:   Rusty Aus, MD [facility] [place] [employer],  [place] [address] [phone]  RESPONSIBLE PARTY:    Contact Information     Name Relation Home Work Mobile   Fred Jones, Fred Jones [phone]  [phone]   Fred Jones, Fred Jones [phone]   Fred Jones Fred Jones [phone]  [phone]        I met face to face with patient and family in the home. Palliative Care was asked to follow this patient by consultation request of  Rusty Aus, MD to address advance care planning and complex medical decision making. This is a follow up visit.                                   ASSESSMENT AND PLAN / RECOMMENDATIONS:   Advance Care Planning/Goals of Care: Goals include to maximize quality of life and symptom management. Patient/health care surrogate gave his/her permission to discuss. Our advance care planning conversation included a discussion about:    The value and importance of advance care planning  Experiences with loved ones who have been seriously ill or have died  Exploration of personal, cultural or spiritual beliefs that might influence medical decisions  Exploration of goals of care in the event of a sudden injury or illness  Discussed code status.  CODE STATUS: Full Code  Education provided on palliative medicine versus hospice services.  Wife would like to have patient managed in the home as best as possible, defer hospitalization if possible we will.  We discussed  patient recent declines.  NP will discuss with hospice medical director to see if he meets eligibility requirements for hospice.  Palliative medicine will continue to provide ongoing support.    I spent 18 minutes providing this consultation. More than 50% of the time in this consultation was spent in counseling and care coordination. --------------------------------------------------------------------------------------------- Symptom Management/Plan:  Parkinson's disease.  Patient with increased functional decline; dependent for all adl's. Decline in appetite, he is sleeping more throughout the day. Caregiver and family report worsening hallucinations. Continue carbidopa levodopa as directed.   Dementia-patient is dependent for all adl's. Caregivers to provide supportive care. Reorient/redirect as needed. Patient being fed; monitor for aspiration. Encourage foods patient enjoys, continue nutritional supplements daily. Worsening behaviors, agitation, continue trazodone nightly. Continue Aricept and namenda as directed.   Urinary tract infection-patient currently being being treated for presumed urinary tract infection due to increased confusion lethargy. Since starting antibiotics, symptoms have improved.   Follow up Palliative Care Visit: Palliative care will continue to follow for complex medical decision making, advance care planning, and clarification of goals. Return in 4 weeks or prn.  This visit was coded based on medical decision making (MDM).  PPS: 30%, weak  HOSPICE ELIGIBILITY/DIAGNOSIS: TBD  Chief Complaint: Palliative Medicine follow up visit.   HISTORY OF PRESENT ILLNESS:  Fred SCHELLINGER Sr. is a 88 y.o. year old male  with  Parkinson's disease, dementia, depression, CVA, hyperlipidemia. ED visit on 05/11/21 due to constipation.  Patient resides at home with wife; caregivers around the clock. Patient is sleeping more; around 18 hours a day now. Increased confusion and lethargy  over the weekend; started on antibiotics for UTI. He is out of bed as tolerated, poor trunk support; unable to sit up for long periods. He is down to eating 2 meals a day. Appetite varies; eating bites to 50% of meals. Wife and caregiver deny coughing with meals. Much thinner, clothes fitting looser. He does complain of heel pain; heel protectors being worn.   Weights: 167 pounds 10/2020, 150 pounds 05/11/21.   History obtained from review of EMR, discussion with primary team, and interview with family, facility staff/caregiver and/or Fred Jones.  I reviewed available labs, medications, imaging, studies and related documents from the EMR.  Records reviewed and summarized above.   ROS  Unable to contribute due to his dementia.    Physical Exam: pulse 60, resp 16, bp 130/80, sats 94% on room air. Constitutional: NAD General: frail appearing, thin EYES: anicteric sclera, lids intact, no discharge  ENMT: intact hearing, oral mucous membranes moist, dentition intact CV: S1S2, RRR, no LE edema Pulmonary: LCTA, no increased work of breathing, no cough, room air Abdomen: normo-active BS + 4 quadrants, soft and non tender, no ascites GU: deferred MSK: +sarcopenia, non-ambulatory Skin: warm and dry, no rashes or wounds on visible skin Neuro: generalized weakness, Alert and oriented to person, familiars Psych: non-anxious affect Hem/lymph/immuno: no widespread bruising   Thank you for the opportunity to participate in the care of Fred Jones.  The palliative care team will continue to follow. Please call our office at [phone] if we can be of additional assistance.   Ezekiel Slocumb, NP   COVID-19 PATIENT SCREENING TOOL Asked and negative response unless otherwise noted:   Have you had symptoms of covid, tested positive or been in contact with someone with symptoms/positive test in the past 5-10 days? No

## 2021-07-31 NOTE — Telephone Encounter (Signed)
Palliative NP called Dr. Sabra Heck to request hospice order and if he will serve as hospice attending. Patient with recent declines due to his Parkinson's, dementia. Awaiting return call.

## 2021-08-12 DIAGNOSIS — G309 Alzheimer's disease, unspecified: Secondary | ICD-10-CM | POA: Diagnosis not present

## 2021-08-12 DIAGNOSIS — F0280 Dementia in other diseases classified elsewhere without behavioral disturbance: Secondary | ICD-10-CM | POA: Diagnosis not present

## 2021-08-12 DIAGNOSIS — F0150 Vascular dementia without behavioral disturbance: Secondary | ICD-10-CM | POA: Diagnosis not present

## 2021-08-13 DIAGNOSIS — R001 Bradycardia, unspecified: Secondary | ICD-10-CM | POA: Diagnosis not present

## 2021-08-13 DIAGNOSIS — G20 Parkinson's disease: Secondary | ICD-10-CM | POA: Diagnosis not present

## 2021-08-13 DIAGNOSIS — E785 Hyperlipidemia, unspecified: Secondary | ICD-10-CM | POA: Diagnosis not present

## 2021-08-13 DIAGNOSIS — F0283 Dementia in other diseases classified elsewhere, unspecified severity, with mood disturbance: Secondary | ICD-10-CM | POA: Diagnosis not present

## 2021-08-13 DIAGNOSIS — I129 Hypertensive chronic kidney disease with stage 1 through stage 4 chronic kidney disease, or unspecified chronic kidney disease: Secondary | ICD-10-CM | POA: Diagnosis not present

## 2021-08-13 DIAGNOSIS — F0284 Dementia in other diseases classified elsewhere, unspecified severity, with anxiety: Secondary | ICD-10-CM | POA: Diagnosis not present

## 2021-08-13 DIAGNOSIS — N1832 Chronic kidney disease, stage 3b: Secondary | ICD-10-CM | POA: Diagnosis not present

## 2021-09-16 ENCOUNTER — Ambulatory Visit: Payer: PPO | Admitting: Dermatology

## 2021-10-27 DIAGNOSIS — R001 Bradycardia, unspecified: Secondary | ICD-10-CM | POA: Diagnosis not present

## 2021-11-04 DIAGNOSIS — F0284 Dementia in other diseases classified elsewhere, unspecified severity, with anxiety: Secondary | ICD-10-CM | POA: Diagnosis not present

## 2021-11-04 DIAGNOSIS — R001 Bradycardia, unspecified: Secondary | ICD-10-CM | POA: Diagnosis not present

## 2021-11-04 DIAGNOSIS — I129 Hypertensive chronic kidney disease with stage 1 through stage 4 chronic kidney disease, or unspecified chronic kidney disease: Secondary | ICD-10-CM | POA: Diagnosis not present

## 2021-11-04 DIAGNOSIS — G20 Parkinson's disease: Secondary | ICD-10-CM | POA: Diagnosis not present

## 2021-11-04 DIAGNOSIS — E785 Hyperlipidemia, unspecified: Secondary | ICD-10-CM | POA: Diagnosis not present

## 2021-11-04 DIAGNOSIS — F0283 Dementia in other diseases classified elsewhere, unspecified severity, with mood disturbance: Secondary | ICD-10-CM | POA: Diagnosis not present

## 2021-11-04 DIAGNOSIS — N1832 Chronic kidney disease, stage 3b: Secondary | ICD-10-CM | POA: Diagnosis not present

## 2021-12-17 DEATH — deceased

## 2023-01-08 IMAGING — CR DG ABDOMEN 1V
1 series · 1 of 1 positions shown · non-contrast
Comparison: None.

CLINICAL DATA: Abdominal pain

EXAM:
ABDOMEN - 1 VIEW

[dg abd 1 view]
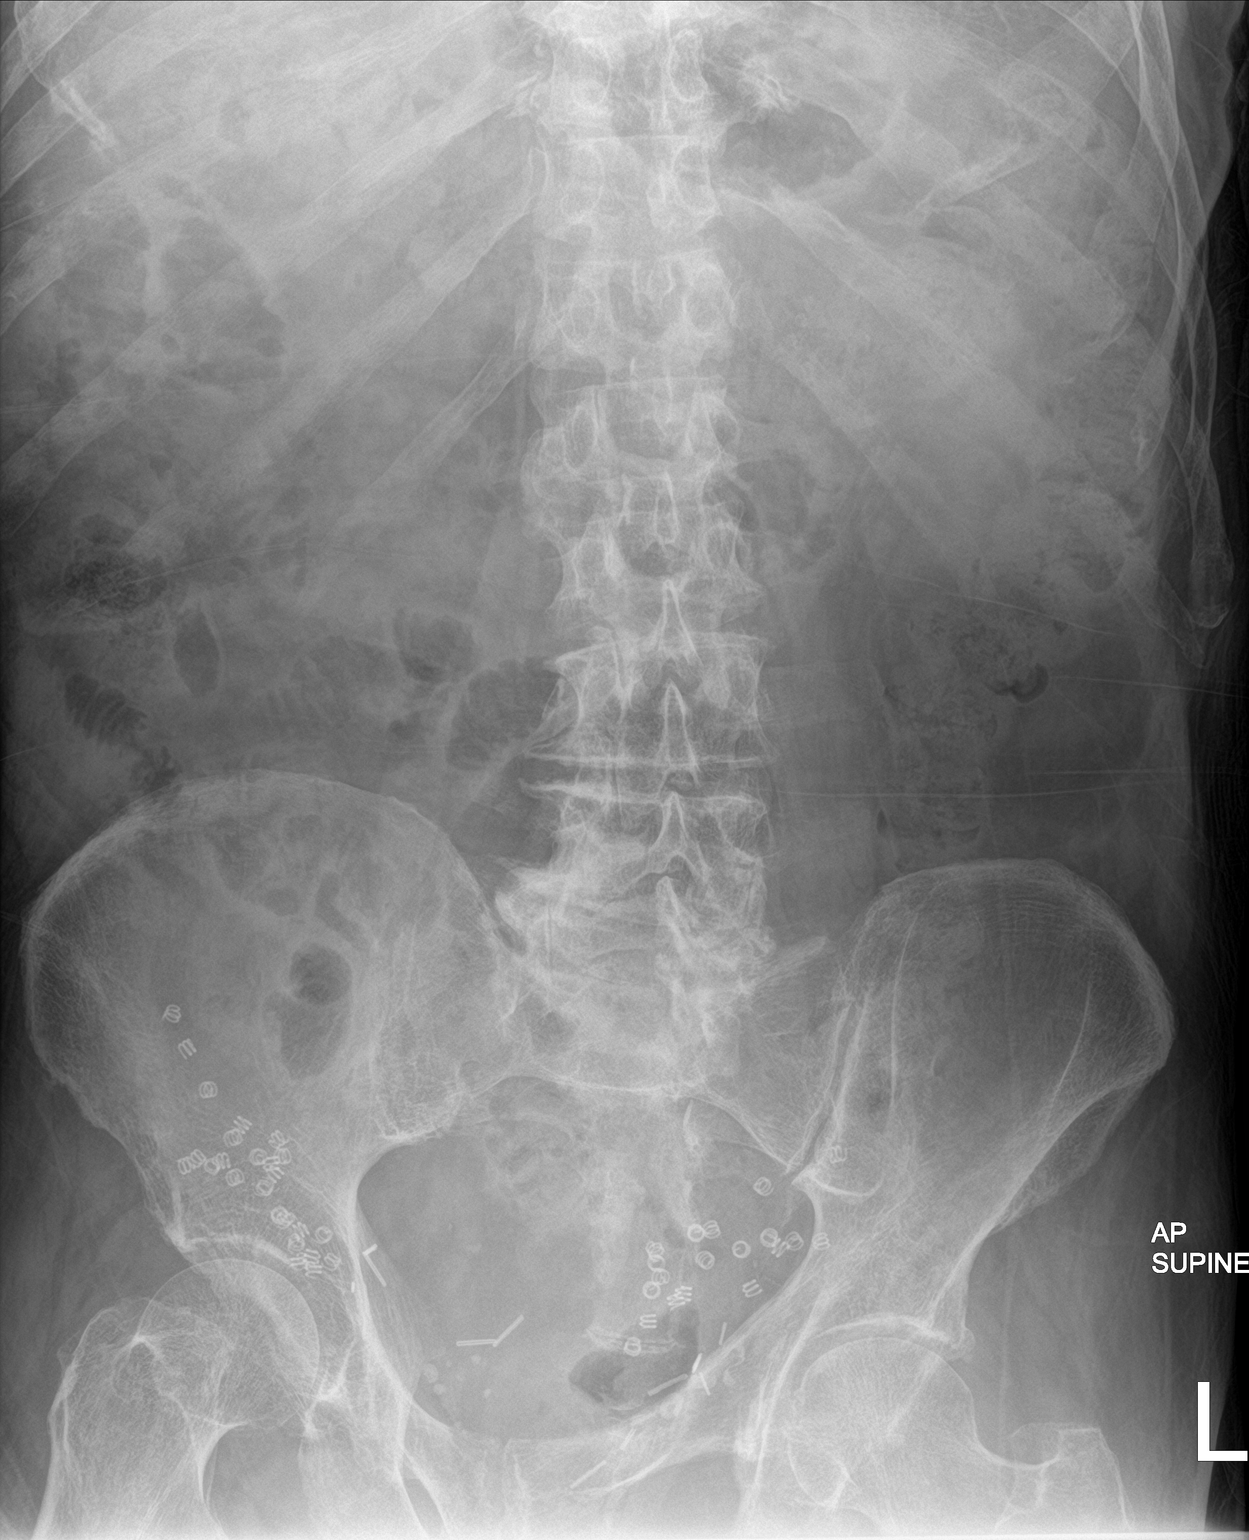

[1 of 1 positions shown; findings below may reference images not displayed]

FINDINGS: The bowel gas pattern is normal. No radio-opaque calculi or other
significant radiographic abnormality are seen. Moderate amount of
retained stool suggesting constipation. Multiple surgical clips
overlying pelvis. Diffuse osteopenia.
IMPRESSION: Nonobstructive bowel gas pattern. Moderate amount of retained
colonic stool suggesting constipation
